# Patient Record
Sex: Female | Born: 1955 | Race: White | Hispanic: No | Marital: Married | Smoking: Former smoker
Health system: Southern US, Community
[De-identification: ages and names within clinical notes are randomized; demographics above are authoritative.]

## PROBLEM LIST (undated history)

## (undated) DIAGNOSIS — I82409 Acute embolism and thrombosis of unspecified deep veins of unspecified lower extremity: Secondary | ICD-10-CM

## (undated) DIAGNOSIS — Z9109 Other allergy status, other than to drugs and biological substances: Secondary | ICD-10-CM

## (undated) DIAGNOSIS — O039 Complete or unspecified spontaneous abortion without complication: Secondary | ICD-10-CM

## (undated) HISTORY — PX: TONSILLECTOMY: SUR1361

## (undated) HISTORY — DX: Complete or unspecified spontaneous abortion without complication: O03.9

## (undated) HISTORY — DX: Acute embolism and thrombosis of unspecified deep veins of unspecified lower extremity: I82.409

## (undated) HISTORY — PX: WISDOM TOOTH EXTRACTION: SHX21

## (undated) HISTORY — PX: DILATION AND CURETTAGE OF UTERUS: SHX78

## (undated) HISTORY — DX: Other allergy status, other than to drugs and biological substances: Z91.09

---

## 1998-09-22 ENCOUNTER — Emergency Department (HOSPITAL_COMMUNITY): Admission: EM | Admit: 1998-09-22 | Discharge: 1998-09-22 | Payer: Self-pay | Admitting: Emergency Medicine

## 1998-09-23 ENCOUNTER — Encounter: Payer: Self-pay | Admitting: Emergency Medicine

## 1998-09-25 ENCOUNTER — Other Ambulatory Visit: Admission: RE | Admit: 1998-09-25 | Discharge: 1998-09-25 | Payer: Self-pay | Admitting: Obstetrics and Gynecology

## 1999-09-28 ENCOUNTER — Other Ambulatory Visit: Admission: RE | Admit: 1999-09-28 | Discharge: 1999-09-28 | Payer: Self-pay | Admitting: *Deleted

## 2000-10-03 ENCOUNTER — Other Ambulatory Visit: Admission: RE | Admit: 2000-10-03 | Discharge: 2000-10-03 | Payer: Self-pay | Admitting: *Deleted

## 2002-03-17 ENCOUNTER — Ambulatory Visit (HOSPITAL_COMMUNITY): Admission: RE | Admit: 2002-03-17 | Discharge: 2002-03-17 | Payer: Self-pay | Admitting: Obstetrics & Gynecology

## 2002-04-13 ENCOUNTER — Other Ambulatory Visit: Admission: RE | Admit: 2002-04-13 | Discharge: 2002-04-13 | Payer: Self-pay | Admitting: Obstetrics & Gynecology

## 2003-01-16 ENCOUNTER — Ambulatory Visit (HOSPITAL_COMMUNITY): Admission: RE | Admit: 2003-01-16 | Discharge: 2003-01-16 | Payer: Self-pay | Admitting: Obstetrics & Gynecology

## 2006-04-03 ENCOUNTER — Encounter: Admission: RE | Admit: 2006-04-03 | Discharge: 2006-04-03 | Payer: Self-pay | Admitting: Orthopedic Surgery

## 2015-08-07 ENCOUNTER — Encounter: Payer: Self-pay | Admitting: Physician Assistant

## 2015-08-07 ENCOUNTER — Ambulatory Visit (INDEPENDENT_AMBULATORY_CARE_PROVIDER_SITE_OTHER): Payer: Commercial Managed Care - HMO | Admitting: Physician Assistant

## 2015-08-07 VITALS — BP 116/76 | HR 80 | Temp 99.1°F | Resp 18 | Wt 224.0 lb

## 2015-08-07 DIAGNOSIS — L500 Allergic urticaria: Secondary | ICD-10-CM | POA: Diagnosis not present

## 2015-08-07 DIAGNOSIS — Z23 Encounter for immunization: Secondary | ICD-10-CM

## 2015-08-07 MED ORDER — PREDNISONE 20 MG PO TABS: ORAL_TABLET | ORAL | Status: DC

## 2015-08-07 MED ORDER — EPINEPHRINE 0.3 MG/0.3ML IJ SOAJ: 0.3000 mg | Freq: Once | INTRAMUSCULAR | Status: DC

## 2015-08-07 NOTE — Progress Notes (Signed)
Patient ID: Courtney Berg MRN: [id number], DOB: 02-06-1956, 59 y.o. Date of Encounter: 08/07/2015, 4:13 PM    Chief Complaint:  Chief Complaint  Patient presents with  . c/o recurrent hives at night    ? allergic reaction???     HPI: 59 y.o. year old white female says that the first time she had an episode of this was at the first of this year. Says that with that episode she developed hives on her fingers, axilla, knees, feet. She took 2 Benadryl and urticaria resolved. Says the next time it happened she decided she better start keeping track of this. Says the second episode occurred on a Thursday, June 28 @ 3 AM. Says that 3 AM she was woken with the exact same findings as above. Uricticaria/hives on her fingers, axilla, knees, feet. Again took 2 Benadryl and it resolved.  The next episode occurred on Wednesday 08/06/15 at 3 AM--- that time the urticaria/hives was mostly on her right waist and right hip her right knee and her feet.  She says that in the past she has known that weed killer would cause her to itch so whenever her husband is having to spray weed killer he knows to stay away from her and she stays away from that.  She says that she is around horses all the time. Never has any problems except for the specific episodes above.  She says that she can see no pattern as to what she has been exposed to at the times of these episodes that she is not exposed to it other times. Has had no change in detergent soaps lotions etc.  Has never  had any other allergic reaction symptoms other than the rash. Has never had difficulty breathing or sensation of throat closing up or tightening or feeling itchy.     Home Meds:   No outpatient prescriptions prior to visit.   No facility-administered medications prior to visit.    Allergies: Not on File    Review of Systems: See HPI for pertinent ROS. All other ROS negative.    Physical Exam: Blood pressure 116/76, pulse 80,  temperature 99.1 F (37.3 C), temperature source Oral, resp. rate 18, weight 224 lb (101.606 kg)., There is no height on file to calculate BMI. General:  WNWD WF. Appears in no acute distress. Neck: Supple. No thyromegaly. No lymphadenopathy. Lungs: Clear bilaterally to auscultation without wheezes, rales, or rhonchi. Breathing is unlabored. Heart: Regular rhythm. No murmurs, rubs, or gallops. Msk:  Strength and tone normal for age. Skin: No rash present currently. No urticaria/hives. Neuro: Alert and oriented X 3. Moves all extremities spontaneously. Gait is normal. CNII-XII grossly in tact. Psych:  Responds to questions appropriately with a normal affect.     ASSESSMENT AND PLAN:  59 y.o. year old female with  1. Allergic urticaria  Wrote down the following for her to take if she has another episode prior to seeing the allergist: She is to take oral Benadryl She is to take Pepcid  If the urticaria does not resolve with this then can take the prednisone.  If she develops any difficulty breathing or sensation of throat closing up tightening up or itching then she is to use the epinephrine.  Referral placed for follow-up with allergist for allergy testing.  - predniSONE (DELTASONE) 20 MG tablet; Take 3 daily for 2 days, then 2 daily for 2 days, then 1 daily for 2 days.  Dispense: 12 tablet; Refill: 0 - EPINEPHrine  0.3 mg/0.3 mL IJ SOAJ injection; Inject 0.3 mLs (0.3 mg total) into the muscle once.  Dispense: 2 Device; Refill: 1 - Ambulatory referral to Allergy  2. Need for prophylactic vaccination and inoculation against influenza - Flu Vaccine QUAD 36+ mos PF IM (Fluarix & Fluzone Quad PF)   Signed, [address] [place], [place], [facility] 08/07/2015 4:13 PM

## 2015-08-19 ENCOUNTER — Other Ambulatory Visit: Payer: Commercial Managed Care - HMO

## 2015-08-19 DIAGNOSIS — Z0000 Encounter for general adult medical examination without abnormal findings: Secondary | ICD-10-CM

## 2015-08-19 LAB — COMPLETE METABOLIC PANEL WITH GFR
ALT: 19 U/L (ref 6–29)
AST: 18 U/L (ref 10–35)
Albumin: 4.2 g/dL (ref 3.6–5.1)
Alkaline Phosphatase: 50 U/L (ref 33–130)
BUN: 10 mg/dL (ref 7–25)
CO2: 29 mmol/L (ref 20–31)
Calcium: 9.6 mg/dL (ref 8.6–10.4)
Chloride: 102 mmol/L (ref 98–110)
Creat: 0.72 mg/dL (ref 0.50–1.05)
GFR, Est African American: >89 mL/min (ref >=60)
GFR, Est Non African American: >89 mL/min (ref >=60)
Glucose, Bld: 96 mg/dL (ref 70–99)
Potassium: 4.2 mmol/L (ref 3.5–5.3)
Sodium: 142 mmol/L (ref 135–146)
Total Bilirubin: 0.4 mg/dL (ref 0.2–1.2)
Total Protein: 7.2 g/dL (ref 6.1–8.1)

## 2015-08-19 LAB — CBC WITH DIFFERENTIAL/PLATELET
Basophils Absolute: 0.1 10*3/uL (ref 0.0–0.1)
Basophils Relative: 1 % (ref 0–1)
EOS ABS: 0.2 10*3/uL (ref 0.0–0.7)
EOS PCT: 4 % (ref 0–5)
HCT: 38.9 % (ref 36.0–46.0)
Hemoglobin: 13.1 g/dL (ref 12.0–15.0)
LYMPHS ABS: 2.1 10*3/uL (ref 0.7–4.0)
Lymphocytes Relative: 41 % (ref 12–46)
MCH: 30.7 pg (ref 26.0–34.0)
MCHC: 33.7 g/dL (ref 30.0–36.0)
MCV: 91.1 fL (ref 78.0–100.0)
MONO ABS: 0.5 10*3/uL (ref 0.1–1.0)
MONOS PCT: 10 % (ref 3–12)
MPV: 9.1 fL (ref 8.6–12.4)
Neutro Abs: 2.3 10*3/uL (ref 1.7–7.7)
Neutrophils Relative %: 44 % (ref 43–77)
PLATELETS: 310 10*3/uL (ref 150–400)
RBC: 4.27 MIL/uL (ref 3.87–5.11)
RDW: 14.4 % (ref 11.5–15.5)
WBC: 5.2 10*3/uL (ref 4.0–10.5)

## 2015-08-19 LAB — LIPID PANEL
Cholesterol: 168 mg/dL (ref 125–200)
HDL: 55 mg/dL (ref >=46)
LDL Cholesterol: 93 mg/dL (ref <130)
Total CHOL/HDL Ratio: 3.1 Ratio (ref <=5.0)
Triglycerides: 102 mg/dL (ref <150)
VLDL: 20 mg/dL (ref <30)

## 2015-08-20 LAB — VITAMIN D 25 HYDROXY (VIT D DEFICIENCY, FRACTURES): Vit D, 25-Hydroxy: 26 ng/mL — ABNORMAL LOW (ref 30–100)

## 2015-08-20 LAB — TSH: TSH: 2.893 u[IU]/mL (ref 0.350–4.500)

## 2015-08-21 ENCOUNTER — Encounter: Payer: Self-pay | Admitting: Physician Assistant

## 2015-08-21 ENCOUNTER — Ambulatory Visit (INDEPENDENT_AMBULATORY_CARE_PROVIDER_SITE_OTHER): Payer: Commercial Managed Care - HMO | Admitting: Physician Assistant

## 2015-08-21 VITALS — BP 116/76 | HR 60 | Temp 98.4°F | Resp 18 | Ht 65.5 in | Wt 214.0 lb

## 2015-08-21 DIAGNOSIS — Z0000 Encounter for general adult medical examination without abnormal findings: Secondary | ICD-10-CM

## 2015-08-21 NOTE — Progress Notes (Signed)
Patient ID: Courtney Berg MRN: [id number], DOB: 04-13-1956, 59 y.o. Date of Encounter: 08/21/2015,   Chief Complaint: Physical (CPE)  HPI: 59 y.o. y/o white female  here for CPE.   She had a recent office visit with me regarding the urticaria. At the time of that visit she was a new patient to our office. However she does report that several family members do come to this office.  She states that she does see Gynecologist, Dr. Edwyna Ready, at [facility].  She does see the dentist routinely and also has eye exam annually. Says that she has worn contacts for many years.  She reports that she sees no other specialists and no other medical providers.  She has no complaints or concerns today.  Review of Systems: Consitutional: No fever, chills, fatigue, night sweats, lymphadenopathy. No significant/unexplained weight changes. Eyes: No visual changes, eye redness, or discharge. ENT/Mouth: No ear pain, sore throat, nasal drainage, or sinus pain. Cardiovascular: No chest pressure,heaviness, tightness or squeezing, even with exertion. No increased shortness of breath or dyspnea on exertion.No palpitations, edema, orthopnea, PND. Respiratory: No cough, hemoptysis, SOB, or wheezing. Gastrointestinal: No anorexia, dysphagia, reflux, pain, nausea, vomiting, hematemesis, diarrhea, constipation, BRBPR, or melena. Breast: No mass, nodules, bulging, or retraction. No skin changes or inflammation. No nipple discharge. No lymphadenopathy. Genitourinary: No dysuria, hematuria, incontinence, vaginal discharge, pruritis, burning, abnormal bleeding, or pain. Musculoskeletal: No decreased ROM, No joint pain or swelling. No significant pain in neck, back, or extremities. Skin: No rash, pruritis, or concerning lesions. Neurological: No headache, dizziness, syncope, seizures, tremors, memory loss, coordination problems, or paresthesias. Psychological: No anxiety, depression, hallucinations,  SI/HI. Endocrine: No polydipsia, polyphagia, polyuria, or known diabetes.No increased fatigue. No palpitations/rapid heart rate. No significant/unexplained weight change. All other systems were reviewed and are otherwise negative.  History reviewed. No pertinent past medical history.   History reviewed. No pertinent past surgical history.  Home Meds:  Outpatient Prescriptions Prior to Visit  Medication Sig Dispense Refill  . diphenhydrAMINE (BENADRYL) 25 MG tablet Take 25 mg by mouth as needed.    Marland Kitchen EPINEPHrine 0.3 mg/0.3 mL IJ SOAJ injection Inject 0.3 mLs (0.3 mg total) into the muscle once. 2 Device 1  . predniSONE (DELTASONE) 20 MG tablet Take 3 daily for 2 days, then 2 daily for 2 days, then 1 daily for 2 days. (Patient not taking: Reported on 08/21/2015) 12 tablet 0   No facility-administered medications prior to visit.    Allergies: No Known Allergies  Social History   Social History  . Marital Status: Married    Spouse Name: N/A  . Number of Children: N/A  . Years of Education: N/A   Occupational History  . Not on file.   Social History Main Topics  . Smoking status: Never Smoker   . Smokeless tobacco: Never Used  . Alcohol Use: No  . Drug Use: No  . Sexual Activity: Not on file   Other Topics Concern  . Not on file   Social History Narrative   Entered 07/2015:   Married.    Does a lot with horses.    Has one son--@ college    Family History  Problem Relation Age of Onset  . Diabetes Mother   . Hypertension Father   . Cancer Father     stomach cancer @ 37. colon cancer @ 60  . Diabetes Brother   . Cancer Sister     leukemia    Physical Exam: Blood pressure 116/76,  pulse 60, temperature 98.4 F (36.9 C), temperature source Oral, resp. rate 18, height 5' 5.5" (1.664 m), weight 214 lb (97.07 kg)., Body mass index is 35.06 kg/(m^2). General: Well developed, well nourished, WF. Appears in no acute distress. HEENT: Normocephalic, atraumatic. Conjunctiva  pink, sclera non-icteric. Pupils 2 mm constricting to 1 mm, round, regular, and equally reactive to light and accomodation. EOMI. Internal auditory canal clear. TMs with good cone of light and without pathology. Nasal mucosa pink. Nares are without discharge. No sinus tenderness. Oral mucosa pink.  Pharynx without exudate.   Neck: Supple. Trachea midline. No thyromegaly. Full ROM. No lymphadenopathy.No Carotid Bruits. Lungs: Clear to auscultation bilaterally without wheezes, rales, or rhonchi. Breathing is of normal effort and unlabored. Cardiovascular: RRR with S1 S2. No murmurs, rubs, or gallops. Distal pulses 2+ symmetrically. No carotid or abdominal bruits. Breast: Per Gyn Abdomen: Soft, non-tender, non-distended with normoactive bowel sounds. No hepatosplenomegaly or masses. No rebound/guarding. No CVA tenderness. No hernias.  Genitourinary: Per Gyn Musculoskeletal: Full range of motion and 5/5 strength throughout.  Skin: Warm and moist without erythema, ecchymosis, wounds, or rash. Neuro: A+Ox3. CN II-XII grossly intact. Moves all extremities spontaneously. Full sensation throughout. Normal gait. DTR 2+ throughout upper and lower extremities.  Psych:  Responds to questions appropriately with a normal affect.   Assessment/Plan:  59 y.o. y/o female here for CPE  1. Visit for preventive health examination  A. Screening Labs: She had fasting labs 08/19/15. CBC normal CMP T normal TSH normal FLP normal--this looks very good. Triglyceride 102, HDL 55, LDL 93 Vitamin D slightly low at 26  B. Pap: Per GYN  C. Screening Mammogram: Per GYN  D. DEXA/BMD:  Per GYN  E. Colorectal Cancer Screening: She reports that she has never had screening colonoscopy. She is agreeable to do so. - Ambulatory referral to Gastroenterology  F. Immunizations:  Influenza:------- given here 08/07/15 Tetanus:-------- she received this 07/25/2012 Pneumococcal:--- She has no indication to require a pneumonia  vaccine until age 60 Zostavax:---------- not indicated until age 63  Continue routine check ups with dentist and continue annual eye exams.  [address] [place], [place], [facility] 08/21/2015 1:43 PM

## 2015-08-22 ENCOUNTER — Encounter: Payer: Self-pay | Admitting: Gastroenterology

## 2015-10-07 ENCOUNTER — Ambulatory Visit (AMBULATORY_SURGERY_CENTER): Payer: Self-pay

## 2015-10-07 VITALS — Ht 67.0 in | Wt 218.6 lb

## 2015-10-07 DIAGNOSIS — Z800 Family history of malignant neoplasm of digestive organs: Secondary | ICD-10-CM

## 2015-10-07 MED ORDER — SUPREP BOWEL PREP KIT 17.5-3.13-1.6 GM/177ML PO SOLN
1.0000 | Freq: Once | ORAL | Status: DC
Start: 2015-10-07 — End: 2015-10-21

## 2015-10-07 NOTE — Progress Notes (Signed)
No allergies to eggs or soy No past problems with anesthesia No home oxygen No diet/weight loss meds  Has email and internet; refused emmi since husband has had one before

## 2015-10-09 ENCOUNTER — Encounter: Payer: Self-pay | Admitting: Gastroenterology

## 2015-10-21 ENCOUNTER — Encounter: Payer: Self-pay | Admitting: Gastroenterology

## 2015-10-21 ENCOUNTER — Ambulatory Visit (AMBULATORY_SURGERY_CENTER): Payer: Commercial Managed Care - HMO | Admitting: Gastroenterology

## 2015-10-21 VITALS — BP 118/69 | HR 58 | Temp 97.2°F | Resp 18 | Ht 67.0 in | Wt 218.0 lb

## 2015-10-21 DIAGNOSIS — Z1211 Encounter for screening for malignant neoplasm of colon: Secondary | ICD-10-CM

## 2015-10-21 DIAGNOSIS — Z800 Family history of malignant neoplasm of digestive organs: Secondary | ICD-10-CM | POA: Diagnosis not present

## 2015-10-21 DIAGNOSIS — D124 Benign neoplasm of descending colon: Secondary | ICD-10-CM

## 2015-10-21 DIAGNOSIS — K635 Polyp of colon: Secondary | ICD-10-CM

## 2015-10-21 MED ORDER — SODIUM CHLORIDE 0.9 % IV SOLN: 500.0000 mL | INTRAVENOUS | Status: DC

## 2015-10-21 NOTE — Patient Instructions (Signed)
YOU HAD AN ENDOSCOPIC PROCEDURE TODAY AT THE [facility]:   Refer to the procedure report that was given to you for any specific questions about what was found during the examination.  If the procedure report does not answer your questions, please call your gastroenterologist to clarify.  If you requested that your care partner not be given the details of your procedure findings, then the procedure report has been included in a sealed envelope for you to review at your convenience later.  YOU SHOULD EXPECT: Some feelings of bloating in the abdomen. Passage of more gas than usual.  Walking can help get rid of the air that was put into your GI tract during the procedure and reduce the bloating. If you had a lower endoscopy (such as a colonoscopy or flexible sigmoidoscopy) you may notice spotting of blood in your stool or on the toilet paper. If you underwent a bowel prep for your procedure, you may not have a normal bowel movement for a few days.  Please Note:  You might notice some irritation and congestion in your nose or some drainage.  This is from the oxygen used during your procedure.  There is no need for concern and it should clear up in a day or so.  SYMPTOMS TO REPORT IMMEDIATELY:   Following lower endoscopy (colonoscopy or flexible sigmoidoscopy):  Excessive amounts of blood in the stool  Significant tenderness or worsening of abdominal pains  Swelling of the abdomen that is new, acute  Fever of 100F or higher   For urgent or emergent issues, a gastroenterologist can be reached at any hour by calling [phone].   DIET: Your first meal following the procedure should be a small meal and then it is ok to progress to your normal diet. Heavy or fried foods are harder to digest and may make you feel nauseous or bloated.  Likewise, meals heavy in dairy and vegetables can increase bloating.  Drink plenty of fluids but you should avoid alcoholic beverages for 24 hours. Try to  increase the fiber in your diet.  ACTIVITY:  You should plan to take it easy for the rest of today and you should NOT DRIVE or use heavy machinery until tomorrow (because of the sedation medicines used during the test).    FOLLOW UP: Our staff will call the number listed on your records the next business day following your procedure to check on you and address any questions or concerns that you may have regarding the information given to you following your procedure. If we do not reach you, we will leave a message.  However, if you are feeling well and you are not experiencing any problems, there is no need to return our call.  We will assume that you have returned to your regular daily activities without incident.  If any biopsies were taken you will be contacted by phone or by letter within the next 1-3 weeks.  Please call us at [phone] if you have not heard about the biopsies in 3 weeks.    SIGNATURES/CONFIDENTIALITY: You and/or your care partner have signed paperwork which will be entered into your electronic medical record.  These signatures attest to the fact that that the information above on your After Visit Summary has been reviewed and is understood.  Full responsibility of the confidentiality of this discharge information lies with you and/or your care-partner.  Read all of the handouts given to you by your recovery room nurse. 

## 2015-10-21 NOTE — Progress Notes (Signed)
Pt stable to RR 

## 2015-10-21 NOTE — Progress Notes (Signed)
Called to room to assist during endoscopic procedure.  Patient ID and intended procedure confirmed with present staff. Received instructions for my participation in the procedure from the performing physician.  

## 2015-10-21 NOTE — Op Note (Signed)
[place]  [address] [place], [address]   COLONOSCOPY PROCEDURE REPORT  PATIENT: Courtney Berg, Courtney Berg  MR#: [id number] BIRTHDATE: April 29, 1956 , 41  yrs. old GENDER: female ENDOSCOPIST: Harl Bowie, MD REFERRED [id number] Dennard Schaumann, M.D. PROCEDURE DATE:  10/21/2015 PROCEDURE:   Colonoscopy with cold biopsy polypectomy First Screening Colonoscopy - Avg.  risk and is 50 yrs.  old or older Yes.  Prior Negative Screening - Now for repeat screening. N/A  History of Adenoma - Now for follow-up colonoscopy & has been > or = to 3 yrs.  N/A  Polyps removed today? Yes ASA CLASS:   Class II INDICATIONS:Screening for colonic neoplasia and FH Colon or Rectal Adenocarcinoma. MEDICATIONS: Propofol 400 mg IV and Lidocaine 40 mg IV  DESCRIPTION OF PROCEDURE:   After the risks benefits and alternatives of the procedure were thoroughly explained, informed consent was obtained.  The digital rectal exam revealed no abnormalities of the rectum.   The LB [id number]  endoscope was introduced through the anus and advanced to the cecum, which was identified by both the appendix and ileocecal valve. No adverse events experienced.   The quality of the prep was good.  The instrument was then slowly withdrawn as the colon was fully examined. Estimated blood loss is zero unless otherwise noted in this procedure report.      COLON FINDINGS: There was mild diverticulosis noted in the ascending colon.   A sessile polyp ranging between 3-69mm in size was found in the descending colon.  Multiple biopsies were performed using cold forceps.  Retroflexed views revealed no abnormalities. The time to cecum = 3.0 Withdrawal time = 11.8   The scope was withdrawn and the procedure completed. COMPLICATIONS: There were no immediate complications.  ENDOSCOPIC IMPRESSION: 1.   There was mild diverticulosis noted in the ascending colon 2.   Sessile polyp ranging between 3-26mm in size was found  in the descending colon; multiple biopsies were performed using cold forceps  RECOMMENDATIONS: 1.  If the polyp(s) removed today are proven to be adenomatous (pre-cancerous) polyps, and given your significant family history of colon cancer, you will need a repeat colonoscopy in 5 years. You will receive a letter within 1-2 weeks with the results of your biopsy as well as final recommendations.  Please call my office if you have not received a letter after 3 weeks.   eSigned:  Harl Bowie, MD 10/21/2015 9:05 AM

## 2015-10-21 NOTE — Progress Notes (Signed)
Patient very vague in answering questions.  States that she "wqill be all right, that she's "just hungry".  Pain #2 at this time.

## 2015-10-22 ENCOUNTER — Telehealth: Payer: Self-pay

## 2015-10-22 NOTE — Telephone Encounter (Signed)
  Follow up Call-  Call back number 10/21/2015  Post procedure Call Back phone  # [phone]  Permission to leave phone message Yes     Patient questions:  Do you have a fever, pain , or abdominal swelling? No. Pain Score  0 *  Have you tolerated food without any problems? Yes.    Have you been able to return to your normal activities? Yes.    Do you have any questions about your discharge instructions: Diet   No. Medications  No. Follow up visit  No.  Do you have questions or concerns about your Care? No.  Actions: * If pain score is 4 or above: No action needed, pain <4.  No problems noted per the pt. maw

## 2015-11-04 ENCOUNTER — Encounter: Payer: Self-pay | Admitting: Gastroenterology

## 2015-12-17 ENCOUNTER — Ambulatory Visit (INDEPENDENT_AMBULATORY_CARE_PROVIDER_SITE_OTHER): Payer: Commercial Managed Care - HMO | Admitting: Internal Medicine

## 2015-12-17 ENCOUNTER — Encounter: Payer: Self-pay | Admitting: Internal Medicine

## 2015-12-17 ENCOUNTER — Other Ambulatory Visit (INDEPENDENT_AMBULATORY_CARE_PROVIDER_SITE_OTHER): Payer: Commercial Managed Care - HMO

## 2015-12-17 VITALS — BP 128/70 | HR 57 | Ht 67.0 in | Wt 218.6 lb

## 2015-12-17 DIAGNOSIS — L500 Allergic urticaria: Secondary | ICD-10-CM

## 2015-12-17 LAB — CBC WITH DIFFERENTIAL/PLATELET
BASOS PCT: 1.2 % (ref 0.0–3.0)
Basophils Absolute: 0.1 10*3/uL (ref 0.0–0.1)
EOS ABS: 0.2 10*3/uL (ref 0.0–0.7)
Eosinophils Relative: 3 % (ref 0.0–5.0)
HCT: 41 % (ref 36.0–46.0)
HEMOGLOBIN: 13.7 g/dL (ref 12.0–15.0)
Lymphocytes Relative: 36.5 % (ref 12.0–46.0)
Lymphs Abs: 2.2 10*3/uL (ref 0.7–4.0)
MCHC: 33.4 g/dL (ref 30.0–36.0)
MCV: 90.3 fl (ref 78.0–100.0)
MONO ABS: 0.7 10*3/uL (ref 0.1–1.0)
Monocytes Relative: 11 % (ref 3.0–12.0)
NEUTROS ABS: 2.9 10*3/uL (ref 1.4–7.7)
Neutrophils Relative %: 48.3 % (ref 43.0–77.0)
PLATELETS: 348 10*3/uL (ref 150.0–400.0)
RBC: 4.54 Mil/uL (ref 3.87–5.11)
RDW: 14 % (ref 11.5–15.5)
WBC: 6 10*3/uL (ref 4.0–10.5)

## 2015-12-17 LAB — HEPATIC FUNCTION PANEL
ALBUMIN: 4.5 g/dL (ref 3.5–5.2)
ALT: 25 U/L (ref 0–35)
AST: 21 U/L (ref 0–37)
Alkaline Phosphatase: 50 U/L (ref 39–117)
Bilirubin, Direct: 0.1 mg/dL (ref 0.0–0.3)
TOTAL PROTEIN: 7.9 g/dL (ref 6.0–8.3)
Total Bilirubin: 0.4 mg/dL (ref 0.2–1.2)

## 2015-12-17 LAB — SEDIMENTATION RATE: Sed Rate: 18 mm/hr (ref 0–22)

## 2015-12-17 NOTE — Patient Instructions (Signed)
Order- lab- Food IgE profile, alpha-gal IGE, Allergy profile,  CBC w diff, ANA, sed rate, hepatic profile   Dx Urticaria  For now- at onset take benadryl 25- 50 mg (H1 antihistamine)   and  Pepcid 10-20 mg (H2 antihistamine) may repeat every 6 hours if needed.

## 2015-12-17 NOTE — Progress Notes (Signed)
12/17/2015-60 year old female former smoker  Referred by Dena Billet, PA-C. consistant breaking out of hives.  starts on palm of hands and moves up her arms each time they break out.   Describes 3 discrete episodes of urticaria without angioedema. Each began during the night and resolved within a few hours after Benadryl. She has now been instructed to take Benadryl plus Pepcid at the outset of another attack. She has an EpiPen and understands how to use it but has had no wheezing, joint pains, syncope or swelling in the mouth or throat. Many years ago she had a similar episode after an insect sting on her forehead. She has noted itching if exposed to a weed killer which she avoids. She spends a lot of time with horses but with no recognized problems. No recognized intolerance to foods. Not on ACEI/ARB or other obvious medication triggers. Postmenopausal. Mother had hives with Naprosyn but otherwise no family history of similar problem. The last episode was several months ago.  Prior to Admission medications   Medication Sig Start Date End Date Taking? Authorizing Provider  diphenhydrAMINE (BENADRYL) 25 MG tablet Take 25 mg by mouth as needed.   Yes Historical Provider, MD  EPINEPHrine 0.3 mg/0.3 mL IJ SOAJ injection Inject 0.3 mLs (0.3 mg total) into the muscle once. 08/07/15  Yes Mary B Dixon, PA-C  famotidine (PEPCID) 10 MG tablet Take 10 mg by mouth as needed for heartburn or indigestion.   Yes Historical Provider, MD  predniSONE (DELTASONE) 20 MG tablet Take 3 daily for 2 days, then 2 daily for 2 days, then 1 daily for 2 days. Patient not taking: Reported on 10/07/2015 08/07/15   Orlena Sheldon, PA-C   Past Medical History  Diagnosis Date  . Environmental allergies     pt still getting workup [id number]  . Miscarriage    Past Surgical History  Procedure Laterality Date  . Wisdom tooth extraction    . Tonsillectomy     Family History  Problem Relation Age of Onset  . Diabetes Mother   .  Hypertension Father   . Cancer Father     stomach cancer @ 8. colon cancer @ 36  . Colon cancer Father   . Diabetes Brother   . Cancer Sister     leukemia   Social History   Social History  . Marital Status: Married    Spouse Name: N/A  . Number of Children: N/A  . Years of Education: N/A   Occupational History  . Not on file.   Social History Main Topics  . Smoking status: Never Smoker   . Smokeless tobacco: Never Used  . Alcohol Use: No  . Drug Use: No  . Sexual Activity: Not on file   Other Topics Concern  . Not on file   Social History Narrative   Entered 07/2015:   Married.    Does a lot with horses.    Has one son--@ college   ROS-see HPI   Negative unless "+" Constitutional:    weight loss, night sweats, fevers, chills, fatigue, lassitude. HEENT:    headaches, difficulty swallowing, tooth/dental problems, sore throat,       sneezing, itching, ear ache, nasal congestion, post nasal drip, snoring CV:    chest pain, orthopnea, PND, swelling in lower extremities, anasarca,  dizziness, palpitations Resp:   shortness of breath with exertion or at rest.                productive cough,   non-productive cough, coughing up of blood.              change in color of mucus.  wheezing.   Skin:    rash or lesions. GI:  No-   heartburn, indigestion, abdominal pain, nausea, vomiting, diarrhea,                 change in bowel habits, loss of appetite GU: dysuria, change in color of urine, no urgency or frequency.   flank pain. MS:   joint pain, stiffness, decreased range of motion, back pain. Neuro-     nothing unusual Psych:  change in mood or affect.  depression or anxiety.   memory loss.  OBJ- Physical Exam General- Alert, Oriented, Affect-appropriate, Distress- none acute Skin- rash-none, lesions- none, excoriation- none Lymphadenopathy- none Head- atraumatic            Eyes- Gross vision intact, PERRLA,  conjunctivae and secretions clear            Ears- Hearing, canals-normal            Nose- Clear, no-Septal dev, mucus, polyps, erosion, perforation             Throat- Mallampati II , mucosa clear , drainage- none, tonsils- atrophic Neck- flexible , trachea midline, no stridor , thyroid nl, carotid no bruit Chest - symmetrical excursion , unlabored           Heart/CV- RRR , no murmur , no gallop  , no rub, nl s1 s2                           - JVD- none , edema- none, stasis changes- none, varices- none           Lung- clear to P&A, wheeze- none, cough- none , dullness-none, rub- none           Chest wall-  Abd-  Br/ Gen/ Rectal- Not done, not indicated Extrem- cyanosis- none, clubbing, none, atrophy- none, strength- nl Neuro- grossly intact to observation

## 2015-12-17 NOTE — Assessment & Plan Note (Signed)
We discussed broad categories of recognized triggers/mechanisms. There is nothing obvious. Since it has now been several months, hopefully the problem is resolved. We did review the use of H1 and H2 antihistamines with potential for other interventions if needed. She has not had angioedema but does have an EpiPen available. She commented that she had eaten "red meat" around the time of each episode of urticaria, but also many other times as well. Plan- search labs for common clues

## 2015-12-18 LAB — ANA: Anti Nuclear Antibody(ANA): NEGATIVE

## 2015-12-19 LAB — FOOD ALLERGY PROFILE
Cashew IgE: <0.1 kU/L
EGG WHITE IGE: 0.14 kU/L — AB
Hazelnut: <0.1 kU/L
Milk IgE: 0.24 kU/L — ABNORMAL HIGH
Scallop IgE: <0.1 kU/L
Sesame Seed f10: <0.1 kU/L
Wheat IgE: <0.1 kU/L

## 2015-12-19 LAB — RESPIRATORY ALLERGY PROFILE REGION II ~~LOC~~
Allergen, Comm Silver Birch, t9: <0.1 kU/L
Allergen, Cottonwood, t14: <0.1 kU/L
Allergen, D pternoyssinus,d7: <0.1 kU/L
Allergen, Mulberry, t76: <0.1 kU/L
Aspergillus fumigatus, m3: <0.1 kU/L
Bermuda Grass: <0.1 kU/L
CAT DANDER: 0.11 kU/L — AB
Cladosporium Herbarum: <0.1 kU/L
Cockroach: <0.1 kU/L
Common Ragweed: <0.1 kU/L
D. farinae: <0.1 kU/L
Dog Dander: 0.15 kU/L — ABNORMAL HIGH
IGE (IMMUNOGLOBULIN E), SERUM: 116 kU/L — AB (ref <115)
Johnson Grass: <0.1 kU/L
Pecan/Hickory Tree IgE: <0.1 kU/L
Penicillium Notatum: <0.1 kU/L
Timothy Grass: <0.1 kU/L

## 2015-12-20 LAB — ALPHA GAL IGE: Alpha Gal IgE*: 2.15 kU/L — ABNORMAL HIGH (ref <0.35)

## 2016-01-30 ENCOUNTER — Telehealth: Payer: Self-pay | Admitting: Internal Medicine

## 2016-01-30 NOTE — Telephone Encounter (Signed)
Result Note     Allergy lab results for evaluation of hives:  There is a high level of allergy body to meat from 4-legged animals ( not fish or poultry). Also elevated against egg white, milk, cat and dog. These results don't prove that allergy triggers symptoms, but it might contribute.Ok to eat the listed foods, but watch more closely for possible connection. Try to reduce exposure to cat and dog by at least keeping them out of bathroom and washing hands after petting. Other labs for blood counts, liver function and lupus were normal.  ---  I spoke with patient about results and she verbalized understanding and had no questions

## 2016-02-02 ENCOUNTER — Telehealth: Payer: Self-pay | Admitting: Internal Medicine

## 2016-02-02 NOTE — Telephone Encounter (Signed)
Spoke with pt, requesting to pick up a copy of her most recent labs.  This has been printed.  Nothing further needed.

## 2016-04-21 ENCOUNTER — Encounter: Payer: Self-pay | Admitting: Internal Medicine

## 2016-04-21 ENCOUNTER — Ambulatory Visit (INDEPENDENT_AMBULATORY_CARE_PROVIDER_SITE_OTHER): Payer: Commercial Managed Care - HMO | Admitting: Internal Medicine

## 2016-04-21 VITALS — BP 122/80 | HR 60 | Ht 67.0 in | Wt 218.2 lb

## 2016-04-21 DIAGNOSIS — L500 Allergic urticaria: Secondary | ICD-10-CM

## 2016-04-21 NOTE — Progress Notes (Signed)
12/17/2015-60 year old female former smoker  Referred by Dena Billet, PA-C. consistant breaking out of hives.  starts on palm of hands and moves up her arms each time they break out.   Describes 3 discrete episodes of urticaria without angioedema. Each began during the night and resolved within a few hours after Benadryl. She has now been instructed to take Benadryl plus Pepcid at the outset of another attack. She has an EpiPen and understands how to use it but has had no wheezing, joint pains, syncope or swelling in the mouth or throat. Many years ago she had a similar episode after an insect sting on her forehead. She has noted itching if exposed to a weed killer which she avoids. She spends a lot of time with horses but with no recognized problems. No recognized intolerance to foods. Not on ACEI/ARB or other obvious medication triggers. Postmenopausal. Mother had hives with Naprosyn but otherwise no family history of similar problem. The last episode was several months ago.  04/21/2016-60 year old female former smoker followed for chronic urticaria FOLLOWS FOR: Pt denies any trouble with rashes or allergies since last seen.  Allergy labs: 12/17/2015 Alpha gal high 2.15  allergy IgE-elevated for egg white, milk, cat, dog     ROS-see HPI   Negative unless "+" Constitutional:    weight loss, night sweats, fevers, chills, fatigue, lassitude. HEENT:    headaches, difficulty swallowing, tooth/dental problems, sore throat,       sneezing, itching, ear ache, nasal congestion, post nasal drip, snoring CV:    chest pain, orthopnea, PND, swelling in lower extremities, anasarca,                                                          dizziness, palpitations Resp:   shortness of breath with exertion or at rest.                productive cough,   non-productive cough, coughing up of blood.              change in color of mucus.  wheezing.   Skin:    rash or lesions. GI:  No-   heartburn, indigestion,  abdominal pain, nausea, vomiting, diarrhea,                 change in bowel habits, loss of appetite GU: dysuria, change in color of urine, no urgency or frequency.   flank pain. MS:   joint pain, stiffness, decreased range of motion, back pain. Neuro-     nothing unusual Psych:  change in mood or affect.  depression or anxiety.   memory loss.  OBJ- Physical Exam General- Alert, Oriented, Affect-appropriate, Distress- none acute Skin- rash-none, lesions- none, excoriation- none Lymphadenopathy- none Head- atraumatic            Eyes- Gross vision intact, PERRLA, conjunctivae and secretions clear            Ears- Hearing, canals-normal            Nose- Clear, no-Septal dev, mucus, polyps, erosion, perforation             Throat- Mallampati II , mucosa clear , drainage- none, tonsils- atrophic Neck- flexible , trachea midline, no stridor , thyroid nl, carotid no bruit Chest - symmetrical excursion , unlabored  Heart/CV- RRR , no murmur , no gallop  , no rub, nl s1 s2                           - JVD- none , edema- none, stasis changes- none, varices- none           Lung- clear to P&A, wheeze- none, cough- none , dullness-none, rub- none           Chest wall-  Abd-  Br/ Gen/ Rectal- Not done, not indicated Extrem- cyanosis- none, clubbing, none, atrophy- none, strength- nl Neuro- grossly intact to observation

## 2016-04-21 NOTE — Patient Instructions (Signed)
You have the elevated allergy antibodies in your blood, but as we discussed, if you don't have problems you can eat what you want. Avoid obvious triggers.  Ok to take an antihistamine like benadryl, claritin, allegra or zyrtec along with an H2 antihistamine like Pepcid, if you start having hives again.

## 2016-10-11 ENCOUNTER — Ambulatory Visit (INDEPENDENT_AMBULATORY_CARE_PROVIDER_SITE_OTHER): Payer: Commercial Managed Care - HMO | Admitting: Family Medicine

## 2016-10-11 DIAGNOSIS — Z23 Encounter for immunization: Secondary | ICD-10-CM | POA: Diagnosis not present

## 2017-01-06 DIAGNOSIS — Z1231 Encounter for screening mammogram for malignant neoplasm of breast: Secondary | ICD-10-CM | POA: Diagnosis not present

## 2017-02-01 DIAGNOSIS — H524 Presbyopia: Secondary | ICD-10-CM | POA: Diagnosis not present

## 2017-02-01 DIAGNOSIS — H5213 Myopia, bilateral: Secondary | ICD-10-CM | POA: Diagnosis not present

## 2017-06-08 ENCOUNTER — Encounter: Payer: Self-pay | Admitting: Family Medicine

## 2017-06-08 ENCOUNTER — Ambulatory Visit (INDEPENDENT_AMBULATORY_CARE_PROVIDER_SITE_OTHER): Payer: Commercial Managed Care - HMO | Admitting: Family Medicine

## 2017-06-08 VITALS — BP 128/72 | HR 66 | Temp 98.7°F | Resp 14 | Ht 67.0 in | Wt 213.0 lb

## 2017-06-08 DIAGNOSIS — L219 Seborrheic dermatitis, unspecified: Secondary | ICD-10-CM | POA: Diagnosis not present

## 2017-06-08 DIAGNOSIS — B9689 Other specified bacterial agents as the cause of diseases classified elsewhere: Secondary | ICD-10-CM | POA: Diagnosis not present

## 2017-06-08 DIAGNOSIS — L089 Local infection of the skin and subcutaneous tissue, unspecified: Secondary | ICD-10-CM | POA: Diagnosis not present

## 2017-06-08 MED ORDER — CEPHALEXIN 500 MG PO CAPS: 500.0000 mg | ORAL_CAPSULE | Freq: Two times a day (BID) | ORAL | 0 refills | Status: DC

## 2017-06-08 MED ORDER — KETOCONAZOLE 2 % EX SHAM: 1.0000 "application " | MEDICATED_SHAMPOO | CUTANEOUS | 0 refills | Status: DC

## 2017-06-08 NOTE — Patient Instructions (Signed)
Wash with shampoo three times a week  Take antibiotics as prescribed F/U as needed

## 2017-06-08 NOTE — Progress Notes (Signed)
   Subjective:    Patient ID: Courtney Berg, female    DOB: [birth year], 61 y.o.   MRN: [id number]  Patient presents for Rash (iritation to back of neck in hairline- states that she has been using iodine to clean and using mometasone furoate cream to treat- reports itching and knots to each side of area) Here with rash to her occipital hairline. She felt itching on Friday when her husband looked at it she has a picture she had erythema with splitting of the skin along with a yellow crusty discharge and flaking. She also had 2 lymph nodes pop-up in the posterior neck. She use iodine to clean it with and then she started using the made as an cream she had at home because of severe itching the rash is pretty much resolved now.  She did not change any of her hair products she last had her hair done at the hair salon about a month ago. She has never had any rash or breakouts before no one in the family had any rash or break out    Review Of Systems:  GEN- denies fatigue, fever, weight loss,weakness, recent illness HEENT- denies eye drainage, change in vision, nasal discharge, CVS- denies chest pain, palpitations RESP- denies SOB, cough, wheeze ABD- denies N/V, change in stools, abd pain GU- denies dysuria, hematuria, dribbling, incontinence MSK- denies joint pain, muscle aches, injury Neuro- denies headache, dizziness, syncope, seizure activity       Objective:    BP 128/72   Pulse 66   Temp 98.7 F (37.1 C) (Oral)   Resp 14   Ht 5\' 7"  (1.702 m)   Wt 213 lb (96.6 kg)   SpO2 98%   BMI 33.36 kg/m  GEN- NAD, alert and oriented x3 HEENT- PERRL, EOMI, non injected sclera, pink conjunctiva, MMM, oropharynx clear Neck- Supple, no thyromegaly, 2 small shotty nodes post neck, mild TTP  Skin- occipital region scalp, mild erythem in linear pattern ( same as picture) mild flaking , no open lesions        Assessment & Plan:      Problem List Items Addressed This Visit    None     Visit Diagnoses    Seborrheic dermatitis of scalp    -  Primary   I think she had fungal infection with superinfection with bacteria. Advised to  Clean all brushes or getting new ones. Steroid helped inflammation a lot. wash with Nizoral shampoos this week  And given 5 days of keflex for bacterial infection/LAD   Superficial bacterial infection of skin       Relevant Medications   cephALEXin (KEFLEX) 500 MG capsule   ketoconazole (NIZORAL) 2 % shampoo (Start on 06/09/2017)      Note: This dictation was prepared with Dragon dictation along with smaller phrase technology. Any transcriptional errors that result from this process are unintentional.

## 2017-07-13 DIAGNOSIS — L821 Other seborrheic keratosis: Secondary | ICD-10-CM | POA: Diagnosis not present

## 2017-07-13 DIAGNOSIS — L400 Psoriasis vulgaris: Secondary | ICD-10-CM | POA: Diagnosis not present

## 2017-07-13 DIAGNOSIS — B078 Other viral warts: Secondary | ICD-10-CM | POA: Diagnosis not present

## 2017-10-12 ENCOUNTER — Ambulatory Visit (INDEPENDENT_AMBULATORY_CARE_PROVIDER_SITE_OTHER): Payer: 59 | Admitting: Family Medicine

## 2017-10-12 DIAGNOSIS — Z23 Encounter for immunization: Secondary | ICD-10-CM

## 2018-01-09 DIAGNOSIS — Z1231 Encounter for screening mammogram for malignant neoplasm of breast: Secondary | ICD-10-CM | POA: Diagnosis not present

## 2018-01-17 ENCOUNTER — Ambulatory Visit: Payer: Commercial Managed Care - HMO | Admitting: Family Medicine

## 2018-01-17 ENCOUNTER — Ambulatory Visit
Admission: RE | Admit: 2018-01-17 | Discharge: 2018-01-17 | Disposition: A | Discharge status: Home / Self Care | Payer: 59 | Source: Ambulatory Visit | Attending: Family Medicine | Admitting: Family Medicine

## 2018-01-17 ENCOUNTER — Encounter: Payer: Self-pay | Admitting: Family Medicine

## 2018-01-17 VITALS — BP 110/68 | HR 78 | Temp 98.2°F | Resp 16 | Ht 67.0 in | Wt 217.0 lb

## 2018-01-17 DIAGNOSIS — M79604 Pain in right leg: Secondary | ICD-10-CM

## 2018-01-17 DIAGNOSIS — R0609 Other forms of dyspnea: Secondary | ICD-10-CM | POA: Diagnosis not present

## 2018-01-17 DIAGNOSIS — R079 Chest pain, unspecified: Secondary | ICD-10-CM

## 2018-01-17 DIAGNOSIS — M79605 Pain in left leg: Principal | ICD-10-CM

## 2018-01-17 DIAGNOSIS — M79662 Pain in left lower leg: Secondary | ICD-10-CM | POA: Diagnosis not present

## 2018-01-17 MED ORDER — PANTOPRAZOLE SODIUM 40 MG PO TBEC: 40.0000 mg | DELAYED_RELEASE_TABLET | Freq: Every day | ORAL | 3 refills | Status: DC

## 2018-01-17 NOTE — Progress Notes (Signed)
Subjective:    Patient ID: Courtney Berg, female    DOB: 12-31-1955, 62 y.o.   MRN: [id number]  HPI  Patient presents with several complaints.  #1 she reports aching pain in her anterior lateral shins whenever she walks.  This is been ongoing for several months.  #2 she reports aching pain in the center of her chest that will frequently awaken her from sleep at night.  This also has been going on for 2 or 3 months.  She also reports stomach discomfort and nausea suggesting acid reflux.  However #3 is her biggest concern.  Saturday she was walking up a flight of steps.  She developed pain in her left arm.  She developed shortness of breath.  She became diaphoretic and pale.  She believed that she was going to pass out.  She barely made it to the top of the steps and had to sit down feeling that she was going to become nauseated.  She stated that she became cool clammy diaphoretic and was pale and extremely short of breath.  She denies feeling as though she was having a panic attack. Past Medical History:  Diagnosis Date  . Environmental allergies    pt still getting workup [id number]  . Miscarriage    Past Surgical History:  Procedure Laterality Date  . TONSILLECTOMY    . WISDOM TOOTH EXTRACTION     Current Outpatient Medications on File Prior to Visit  Medication Sig Dispense Refill  . EPINEPHrine 0.3 mg/0.3 mL IJ SOAJ injection Inject 0.3 mLs (0.3 mg total) into the muscle once. 2 Device 1  . famotidine (PEPCID) 10 MG tablet Take 10 mg by mouth as needed for heartburn or indigestion. Reported on 04/21/2016     No current facility-administered medications on file prior to visit.    No Known Allergies Social History   Socioeconomic History  . Marital status: Married    Spouse name: Not on file  . Number of children: Not on file  . Years of education: Not on file  . Highest education level: Not on file  Social Needs  . Financial resource strain: Not on file  . Food insecurity  - worry: Not on file  . Food insecurity - inability: Not on file  . Transportation needs - medical: Not on file  . Transportation needs - non-medical: Not on file  Occupational History  . Not on file  Tobacco Use  . Smoking status: Former Smoker    Packs/day: 0.25    Years: 7.00    Pack years: 1.75    Types: Cigarettes    Start date: 11/22/1978    Last attempt to quit: 04/21/1986    Years since quitting: 31.7  . Smokeless tobacco: Never Used  Substance and Sexual Activity  . Alcohol use: No    Alcohol/week: 0.0 oz  . Drug use: No  . Sexual activity: Not on file  Other Topics Concern  . Not on file  Social History Narrative   Entered 07/2015:   Married.    Does a lot with horses.    Has one son--@ college     Review of Systems  All other systems reviewed and are negative.      Objective:   Physical Exam  Constitutional: She appears well-developed and well-nourished. No distress.  Cardiovascular: Normal rate, regular rhythm, normal heart sounds and intact distal pulses. Exam reveals no gallop and no friction rub.  No murmur heard. Pulses:  Popliteal pulses are 2+ on the right side, and 2+ on the left side.       Dorsalis pedis pulses are 2+ on the right side, and 2+ on the left side.       Posterior tibial pulses are 2+ on the right side, and 2+ on the left side.  Pulmonary/Chest: Effort normal and breath sounds normal. No respiratory distress. She has no wheezes. She has no rales. She exhibits no tenderness.  Abdominal: Soft. Bowel sounds are normal. She exhibits no distension and no mass. There is no tenderness. There is no rebound and no guarding.  Musculoskeletal:       Right lower leg: She exhibits no tenderness, no bony tenderness, no swelling, no edema and no deformity.       Left lower leg: She exhibits no tenderness, no bony tenderness, no swelling, no edema and no deformity.  Skin: She is not diaphoretic.  Vitals reviewed.         Assessment & Plan:   Chest pain in adult - Plan: EKG 12-Lead, pantoprazole (PROTONIX) 40 MG tablet, Ambulatory referral to Cardiology  Leg pain, bilateral - Plan: DG Tibia/Fibula Left, DG Tibia/Fibula Right  Dyspnea on exertion - Plan: Ambulatory referral to Cardiology   I believe the patient has separate problems.  She has normal pulses in her lower extremities.  I do not believe the leg pain she is experiencing is claudication due to peripheral vascular disease.  She is concerned Saturday may have been due to her heart.  Symptoms are concerning for possible atypical angina.  The other possibility would been a vasovagal reaction due to a panic attack.  The pain in her left shoulder appears to be due to bursitis as she has pain with Hawkins maneuver as well as empty can testing.  I believe the chest pain she is experiencing at night is likely acid reflux.  EKG shows normal sinus rhythm with nonspecific ST changes in the inferior leads.  I believe the likelihood of coronary artery disease is low however I do believe cardiology consult for a stress test is warranted.  Meanwhile I will treat the patient for acid reflux with protonix 40 mg a day and obtain x-rays of the lower legs to evaluate the pain that she is experiencing in her shins.

## 2018-01-23 ENCOUNTER — Encounter: Payer: Self-pay | Admitting: Cardiology

## 2018-01-23 ENCOUNTER — Ambulatory Visit: Payer: 59 | Admitting: Cardiology

## 2018-01-23 VITALS — BP 118/72 | HR 58 | Ht 67.0 in | Wt 218.1 lb

## 2018-01-23 DIAGNOSIS — R0609 Other forms of dyspnea: Secondary | ICD-10-CM | POA: Diagnosis not present

## 2018-01-23 DIAGNOSIS — Z13220 Encounter for screening for lipoid disorders: Secondary | ICD-10-CM | POA: Diagnosis not present

## 2018-01-23 DIAGNOSIS — Z1329 Encounter for screening for other suspected endocrine disorder: Secondary | ICD-10-CM | POA: Diagnosis not present

## 2018-01-23 NOTE — Addendum Note (Signed)
Addended by: Mattie Marlin on: 01/23/2018 03:26 PM   Modules accepted: Orders

## 2018-01-23 NOTE — Patient Instructions (Signed)
Medication Instructions:  Your physician recommends that you continue on your current medications as directed. Please refer to the Current Medication list given to you today.  Labwork: Your physician recommends that you have the following labs drawn: BMP, CBC, TSH, d-dimer, and liver/lipid panel  Testing/Procedures: Your physician has requested that you have an echocardiogram. Echocardiography is a painless test that uses sound waves to create images of your heart. It provides your doctor with information about the size and shape of your heart and how well your heart's chambers and valves are working. This procedure takes approximately one hour. There are no restrictions for this procedure.  Your physician has requested that you have en exercise stress myoview. For further information please visit [email or web address]. Please follow instruction sheet, as given.   Follow-Up: Your physician recommends that you schedule a follow-up appointment in: 3 months  Any Other Special Instructions Will Be Listed Below (If Applicable).     If you need a refill on your cardiac medications before your next appointment, please call your pharmacy.   [place], RN, BSN

## 2018-01-23 NOTE — Progress Notes (Signed)
Cardiology Office Note:    Date:  01/23/2018   ID:  Courtney Berg, DOB [birth year], MRN [id number]  PCP:  Susy Frizzle, MD  Cardiologist:  Jenean Lindau, MD   Referring MD: Susy Frizzle, MD    ASSESSMENT:    1. DOE (dyspnea on exertion)   2. Screening for cholesterol level   3. Screening for thyroid disorder    PLAN:    In order of problems listed above:  1. Primary prevention stressed with the patient.  Importance of compliance with diet stressed and she vocalized understanding. 2. Her blood pressure is stable.  We will check other blood work including fasting lipids.  We will also get a d-dimer for her shortness of breath and exertional issues as these are concerning. 3. Given a choice of evaluation with invasive and noninvasive means.  She feels noninvasive means and we will do an exercise stress Cardiolite.  Echocardiogram will be done to assess murmur heard on auscultation.  I reviewed EKG done at primary care physician's office which was unremarkable and delivery revealed left atrial enlargement. 4. Patient will be seen in follow-up appointment in 3 months or earlier if the patient has any concerns 5. She knows to go to the nearest emergency room for any significant concerns.   Medication Adjustments/Labs and Tests Ordered: Current medicines are reviewed at length with the patient today.  Concerns regarding medicines are outlined above.  Orders Placed This Encounter  Procedures  . Basic metabolic panel  . CBC with Differential/Platelet  . TSH  . Hepatic function panel  . D-Dimer, Quantitative  . Lipid panel  . MYOCARDIAL PERFUSION IMAGING  . ECHOCARDIOGRAM COMPLETE   No orders of the defined types were placed in this encounter.    History of Present Illness:    Courtney Berg is a 62 y.o. female who is being seen today for the evaluation of shortness of breath on exertion at the request of Susy Frizzle, MD.  Patient is a pleasant 62 year old  female.  She has no significant past medical history.  She mentions to me that over the past several weeks she is getting short of breath on exertion.  No chest pain orthopnea or PND.  This is concerning to her and has experienced a decrease in effort tolerance and her quality of life is not good because of the symptoms.  She tells me that she climbs several flights of steps the other day and felt dizzy.  At the time of my evaluation, the patient is alert awake oriented and in no distress.  Her husband accompanies her for this visit.  Past Medical History:  Diagnosis Date  . Environmental allergies    pt still getting workup [id number]  . Miscarriage     Past Surgical History:  Procedure Laterality Date  . TONSILLECTOMY    . WISDOM TOOTH EXTRACTION      Current Medications: Current Meds  Medication Sig  . pantoprazole (PROTONIX) 40 MG tablet Take 1 tablet (40 mg total) by mouth daily.     Allergies:   Patient has no known allergies.   Social History   Socioeconomic History  . Marital status: Married    Spouse name: None  . Number of children: None  . Years of education: None  . Highest education level: None  Social Needs  . Financial resource strain: None  . Food insecurity - worry: None  . Food insecurity - inability: None  . Transportation needs - medical:  None  . Transportation needs - non-medical: None  Occupational History  . None  Tobacco Use  . Smoking status: Former Smoker    Packs/day: 0.25    Years: 7.00    Pack years: 1.75    Types: Cigarettes    Start date: 11/22/1978    Last attempt to quit: 04/21/1986    Years since quitting: 31.7  . Smokeless tobacco: Never Used  Substance and Sexual Activity  . Alcohol use: No    Alcohol/week: 0.0 oz  . Drug use: No  . Sexual activity: None  Other Topics Concern  . None  Social History Narrative   Entered 07/2015:   Married.    Does a lot with horses.    Has one son--@ college     Family History: The  patient's family history includes Cancer in her father and sister; Colon cancer in her father; Diabetes in her brother and mother; Hypertension in her father.  ROS:   Please see the history of present illness.    All other systems reviewed and are negative.  EKGs/Labs/Other Studies Reviewed:    The following studies were reviewed today: I reviewed her records.  EKG reveals sinus rhythm with nonspecific ST-T changes.   Recent Labs: No results found for requested labs within last 8760 hours.  Recent Lipid Panel    Component Value Date/Time   CHOL 168 08/19/2015 0944   TRIG 102 08/19/2015 0944   HDL 55 08/19/2015 0944   CHOLHDL 3.1 08/19/2015 0944   VLDL 20 08/19/2015 0944   LDLCALC 93 08/19/2015 0944    Physical Exam:    VS:  BP 118/72 (BP Location: Right Arm, Patient Position: Sitting, Cuff Size: Normal)   Pulse (!) 58   Ht 5\' 7"  (1.702 m)   Wt 218 lb 1.9 oz (98.9 kg)   SpO2 97%   BMI 34.16 kg/m     Wt Readings from Last 3 Encounters:  01/23/18 218 lb 1.9 oz (98.9 kg)  01/17/18 217 lb (98.4 kg)  06/08/17 213 lb (96.6 kg)     GEN: Patient is in no acute distress HEENT: Normal NECK: No JVD; No carotid bruits LYMPHATICS: No lymphadenopathy CARDIAC: S1 S2 regular, 2/6 systolic murmur at the apex. RESPIRATORY:  Clear to auscultation without rales, wheezing or rhonchi  ABDOMEN: Soft, non-tender, non-distended MUSCULOSKELETAL:  No edema; No deformity  SKIN: Warm and dry NEUROLOGIC:  Alert and oriented x 3 PSYCHIATRIC:  Normal affect    Signed, Jenean Lindau, MD  01/23/2018 2:31 PM    [facility]

## 2018-01-24 ENCOUNTER — Ambulatory Visit (HOSPITAL_BASED_OUTPATIENT_CLINIC_OR_DEPARTMENT_OTHER)
Admission: RE | Admit: 2018-01-24 | Discharge: 2018-01-24 | Disposition: A | Discharge status: Home / Self Care | Payer: 59 | Source: Ambulatory Visit | Attending: Cardiology | Admitting: Cardiology

## 2018-01-24 ENCOUNTER — Other Ambulatory Visit: Payer: Self-pay

## 2018-01-24 ENCOUNTER — Telehealth: Payer: Self-pay | Admitting: Cardiology

## 2018-01-24 ENCOUNTER — Telehealth (HOSPITAL_COMMUNITY): Payer: Self-pay | Admitting: *Deleted

## 2018-01-24 DIAGNOSIS — R7989 Other specified abnormal findings of blood chemistry: Secondary | ICD-10-CM

## 2018-01-24 DIAGNOSIS — R0609 Other forms of dyspnea: Secondary | ICD-10-CM | POA: Insufficient documentation

## 2018-01-24 DIAGNOSIS — K760 Fatty (change of) liver, not elsewhere classified: Secondary | ICD-10-CM | POA: Diagnosis not present

## 2018-01-24 DIAGNOSIS — I700 Atherosclerosis of aorta: Secondary | ICD-10-CM | POA: Diagnosis not present

## 2018-01-24 MED ORDER — IOPAMIDOL (ISOVUE-370) INJECTION 76%
100.0000 mL | Freq: Once | INTRAVENOUS | Status: AC | PRN
  Administered 2018-01-24: 74 mL via INTRAVENOUS

## 2018-01-24 NOTE — Telephone Encounter (Signed)
Returning your call. °

## 2018-01-24 NOTE — Telephone Encounter (Signed)
Patient given detailed instructions per Myocardial Perfusion Study Information Sheet for the test on 01/30/18 at 0945. Patient notified to arrive 15 minutes early and that it is imperative to arrive on time for appointment to keep from having the test rescheduled.  If you need to cancel or reschedule your appointment, please call the office within 24 hours of your appointment. . Patient verbalized understanding.Courtney Berg, Ranae [place]

## 2018-01-25 NOTE — Telephone Encounter (Signed)
Spoke with patient yesterday.

## 2018-01-27 LAB — CBC WITH DIFFERENTIAL/PLATELET
Basophils Absolute: 0.1 10*3/uL (ref 0.0–0.2)
Basos: 2 %
EOS (ABSOLUTE): 0.2 10*3/uL (ref 0.0–0.4)
EOS: 2 %
HEMOGLOBIN: 13.7 g/dL (ref 11.1–15.9)
Hematocrit: 42.1 % (ref 34.0–46.6)
IMMATURE GRANS (ABS): 0 10*3/uL (ref 0.0–0.1)
Immature Granulocytes: 0 %
LYMPHS: 38 %
Lymphocytes Absolute: 2.5 10*3/uL (ref 0.7–3.1)
MCH: 30.6 pg (ref 26.6–33.0)
MCHC: 32.5 g/dL (ref 31.5–35.7)
MCV: 94 fL (ref 79–97)
MONOCYTES: 9 %
Monocytes Absolute: 0.6 10*3/uL (ref 0.1–0.9)
NEUTROS ABS: 3.1 10*3/uL (ref 1.4–7.0)
Neutrophils: 49 %
Platelets: 358 10*3/uL (ref 150–379)
RBC: 4.47 x10E6/uL (ref 3.77–5.28)
RDW: 14.3 % (ref 12.3–15.4)
WBC: 6.4 10*3/uL (ref 3.4–10.8)

## 2018-01-27 LAB — D-DIMER, QUANTITATIVE (NOT AT ARMC): D-DIMER: 0.84 mg{FEU}/L — AB (ref 0.00–0.49)

## 2018-01-27 LAB — BASIC METABOLIC PANEL
BUN/Creatinine Ratio: 14 (ref 12–28)
BUN: 11 mg/dL (ref 8–27)
CALCIUM: 9.9 mg/dL (ref 8.7–10.3)
CO2: 26 mmol/L (ref 20–29)
CREATININE: 0.81 mg/dL (ref 0.57–1.00)
Chloride: 99 mmol/L (ref 96–106)
GFR calc Af Amer: 91 mL/min/{1.73_m2} (ref >59)
GFR, EST NON AFRICAN AMERICAN: 79 mL/min/{1.73_m2} (ref >59)
Glucose: 89 mg/dL (ref 65–99)
POTASSIUM: 4.2 mmol/L (ref 3.5–5.2)
Sodium: 138 mmol/L (ref 134–144)

## 2018-01-27 LAB — HEPATIC FUNCTION PANEL
ALT: 27 IU/L (ref 0–32)
AST: 24 IU/L (ref 0–40)
Albumin: 4.6 g/dL (ref 3.6–4.8)
Alkaline Phosphatase: 51 IU/L (ref 39–117)
BILIRUBIN, DIRECT: 0.11 mg/dL (ref 0.00–0.40)
Bilirubin Total: 0.3 mg/dL (ref 0.0–1.2)
TOTAL PROTEIN: 7.9 g/dL (ref 6.0–8.5)

## 2018-01-27 LAB — TSH: TSH: 2.54 u[IU]/mL (ref 0.450–4.500)

## 2018-01-30 ENCOUNTER — Ambulatory Visit (HOSPITAL_BASED_OUTPATIENT_CLINIC_OR_DEPARTMENT_OTHER): Payer: 59

## 2018-01-30 ENCOUNTER — Ambulatory Visit (HOSPITAL_COMMUNITY): Payer: 59 | Attending: Cardiology

## 2018-01-30 VITALS — Ht 67.0 in | Wt 218.0 lb

## 2018-01-30 DIAGNOSIS — R0609 Other forms of dyspnea: Secondary | ICD-10-CM

## 2018-01-30 DIAGNOSIS — I340 Nonrheumatic mitral (valve) insufficiency: Secondary | ICD-10-CM | POA: Diagnosis not present

## 2018-01-30 MED ORDER — TECHNETIUM TC 99M TETROFOSMIN IV KIT
32.4000 | PACK | Freq: Once | INTRAVENOUS | Status: AC | PRN
  Administered 2018-01-30: 32.4 via INTRAVENOUS
  Filled 2018-01-30: qty 33

## 2018-01-31 ENCOUNTER — Other Ambulatory Visit: Payer: 59

## 2018-01-31 ENCOUNTER — Ambulatory Visit (HOSPITAL_COMMUNITY): Payer: 59 | Attending: Cardiovascular Disease

## 2018-01-31 DIAGNOSIS — Z13220 Encounter for screening for lipoid disorders: Secondary | ICD-10-CM | POA: Diagnosis not present

## 2018-01-31 LAB — LIPID PANEL
CHOL/HDL RATIO: 2.4 ratio (ref 0.0–4.4)
Cholesterol, Total: 178 mg/dL (ref 100–199)
HDL: 74 mg/dL (ref >39)
LDL CALC: 84 mg/dL (ref 0–99)
TRIGLYCERIDES: 99 mg/dL (ref 0–149)
VLDL CHOLESTEROL CAL: 20 mg/dL (ref 5–40)

## 2018-01-31 LAB — MYOCARDIAL PERFUSION IMAGING
CHL CUP MPHR: 160 {beats}/min
CHL CUP NUCLEAR SDS: 0
CHL CUP NUCLEAR SRS: 1
CHL CUP NUCLEAR SSS: 1
CHL CUP RESTING HR STRESS: 69 {beats}/min
CSEPED: 4 min
CSEPEW: 6.4 METS
Exercise duration (sec): 30 s
LV dias vol: 92 mL (ref 46–106)
LV sys vol: 29 mL
Peak HR: 141 {beats}/min
Percent HR: 88 %
RATE: 0.35
TID: 0.89

## 2018-01-31 MED ORDER — TECHNETIUM TC 99M TETROFOSMIN IV KIT
32.0000 | PACK | Freq: Once | INTRAVENOUS | Status: AC | PRN
  Administered 2018-01-31: 32 via INTRAVENOUS
  Filled 2018-01-31: qty 32

## 2018-02-02 DIAGNOSIS — H524 Presbyopia: Secondary | ICD-10-CM | POA: Diagnosis not present

## 2018-02-02 DIAGNOSIS — H5213 Myopia, bilateral: Secondary | ICD-10-CM | POA: Diagnosis not present

## 2018-10-18 ENCOUNTER — Ambulatory Visit (INDEPENDENT_AMBULATORY_CARE_PROVIDER_SITE_OTHER): Payer: 59 | Admitting: Family Medicine

## 2018-10-18 DIAGNOSIS — Z23 Encounter for immunization: Secondary | ICD-10-CM

## 2019-01-15 DIAGNOSIS — Z1231 Encounter for screening mammogram for malignant neoplasm of breast: Secondary | ICD-10-CM | POA: Diagnosis not present

## 2019-01-15 LAB — HM MAMMOGRAPHY

## 2019-01-17 ENCOUNTER — Encounter: Payer: Self-pay | Admitting: *Deleted

## 2019-10-17 ENCOUNTER — Ambulatory Visit (INDEPENDENT_AMBULATORY_CARE_PROVIDER_SITE_OTHER): Payer: 59

## 2019-10-17 ENCOUNTER — Other Ambulatory Visit: Payer: Self-pay

## 2019-10-17 DIAGNOSIS — Z23 Encounter for immunization: Secondary | ICD-10-CM | POA: Diagnosis not present

## 2020-02-23 ENCOUNTER — Ambulatory Visit: Payer: Self-pay | Attending: Internal Medicine

## 2020-02-23 DIAGNOSIS — Z23 Encounter for immunization: Secondary | ICD-10-CM

## 2020-02-23 NOTE — Progress Notes (Signed)
   Covid-19 Vaccination Clinic  Name:  Courtney Berg    MRN: [id number] DOB: 12-13-1955  02/23/2020  [name] was observed post Covid-19 immunization for 15 minutes without incident. She was provided with Vaccine Information Sheet and instruction to access the V-Safe system.   [name] was instructed to call 911 with any severe reactions post vaccine: Marland Kitchen Difficulty breathing  . Swelling of face and throat  . A fast heartbeat  . A bad rash all over body  . Dizziness and weakness   Immunizations Administered    Name Date Dose VIS Date Route   Pfizer COVID-19 Vaccine 02/23/2020 10:59 AM 0.3 mL 11/02/2019 Intramuscular   Manufacturer: [employer], [employer]   Lot: [id number]   [place]: [id number]

## 2020-03-15 ENCOUNTER — Ambulatory Visit: Payer: Self-pay

## 2020-03-19 ENCOUNTER — Ambulatory Visit: Payer: Self-pay

## 2020-03-22 ENCOUNTER — Ambulatory Visit: Payer: Self-pay | Attending: Internal Medicine

## 2020-03-22 DIAGNOSIS — Z23 Encounter for immunization: Secondary | ICD-10-CM

## 2020-03-22 NOTE — Progress Notes (Signed)
   Covid-19 Vaccination Clinic  Name:  Courtney Berg    MRN: [id number] DOB: [birth year]  03/22/2020  [name] was observed post Covid-19 immunization for 15 minutes without incident. She was provided with Vaccine Information Sheet and instruction to access the V-Safe system.   [name] was instructed to call 911 with any severe reactions post vaccine: Marland Kitchen Difficulty breathing  . Swelling of face and throat  . A fast heartbeat  . A bad rash all over body  . Dizziness and weakness   Immunizations Administered    Name Date Dose VIS Date Route   Pfizer COVID-19 Vaccine 03/22/2020 10:26 AM 0.3 mL 01/16/2019 Intramuscular   Manufacturer: [place]   Lot: [id number]   [place]: [id number]

## 2020-11-26 ENCOUNTER — Ambulatory Visit: Payer: Self-pay

## 2020-11-26 ENCOUNTER — Ambulatory Visit: Payer: Self-pay | Attending: Internal Medicine

## 2020-11-26 ENCOUNTER — Other Ambulatory Visit (HOSPITAL_COMMUNITY): Payer: Self-pay | Admitting: Internal Medicine

## 2020-11-26 DIAGNOSIS — Z23 Encounter for immunization: Secondary | ICD-10-CM

## 2020-11-26 NOTE — Progress Notes (Signed)
   Covid-19 Vaccination Clinic  Name:  Courtney Berg    MRN: [id number] DOB: 1956/07/16  11/26/2020  Courtney Berg was observed post Covid-19 immunization for 15 minutes without incident. She was provided with Vaccine Information Sheet and instruction to access the V-Safe system.   Courtney Berg was instructed to call 911 with any severe reactions post vaccine: Marland Kitchen Difficulty breathing  . Swelling of face and throat  . A fast heartbeat  . A bad rash all over body  . Dizziness and weakness   Immunizations Administered    Name Date Dose VIS Date Route   Pfizer COVID-19 Vaccine 11/26/2020 12:08 PM 0.3 mL 09/10/2020 Intramuscular   Manufacturer: [employer], [employer]   Lot: [id number]   ND[place]: [address]-6

## 2020-12-16 ENCOUNTER — Other Ambulatory Visit: Payer: Self-pay

## 2020-12-16 ENCOUNTER — Ambulatory Visit: Payer: 59 | Admitting: Family Medicine

## 2020-12-16 ENCOUNTER — Encounter: Payer: Self-pay | Admitting: Family Medicine

## 2020-12-16 VITALS — BP 130/58 | HR 79 | Temp 98.5°F | Ht 67.0 in | Wt 206.0 lb

## 2020-12-16 DIAGNOSIS — Z23 Encounter for immunization: Secondary | ICD-10-CM | POA: Diagnosis not present

## 2020-12-16 DIAGNOSIS — G8929 Other chronic pain: Secondary | ICD-10-CM | POA: Diagnosis not present

## 2020-12-16 DIAGNOSIS — M25561 Pain in right knee: Secondary | ICD-10-CM

## 2020-12-16 DIAGNOSIS — Z13220 Encounter for screening for lipoid disorders: Secondary | ICD-10-CM | POA: Diagnosis not present

## 2020-12-16 DIAGNOSIS — M25562 Pain in left knee: Secondary | ICD-10-CM | POA: Diagnosis not present

## 2020-12-16 LAB — CBC WITH DIFFERENTIAL/PLATELET
Basophils Absolute: 102 cells/uL (ref 0–200)
Eosinophils Absolute: 88 cells/uL (ref 15–500)

## 2020-12-16 NOTE — Progress Notes (Signed)
Subjective:    Patient ID: Courtney Berg, female    DOB: 1956-07-30, 66 y.o.   MRN: [id number]  HPI  Patient has had bilateral knee pain.  Is been going on for more than a year however over the last couple of weeks has been more serious.  I saw her 2 years ago and did bilateral tibia/fibula films that were negative.  However the pain is now primarily centered around the knees.  She describes an aching pain that wakes her up at night.  It hurts to climb or go up steps.  It hurts to walk on level ground.  She reports aching throbbing pain on both joint lines.  She is tried Tylenol with no relief.  She denies any laxity to varus or valgus stress.  She denies any locking or catching in the knee. Past Medical History:  Diagnosis Date  . Environmental allergies    pt still getting workup [id number]  . Miscarriage    Past Surgical History:  Procedure Laterality Date  . TONSILLECTOMY    . WISDOM TOOTH EXTRACTION     No current outpatient medications on file prior to visit.   No current facility-administered medications on file prior to visit.   No Known Allergies Social History   Socioeconomic History  . Marital status: Married    Spouse name: Not on file  . Number of children: Not on file  . Years of education: Not on file  . Highest education level: Not on file  Occupational History  . Not on file  Tobacco Use  . Smoking status: Former Smoker    Packs/day: 0.25    Years: 7.00    Pack years: 1.75    Types: Cigarettes    Start date: 11/22/1978    Quit date: 04/21/1986    Years since quitting: 34.6  . Smokeless tobacco: Never Used  Substance and Sexual Activity  . Alcohol use: No    Alcohol/week: 0.0 standard drinks  . Drug use: No  . Sexual activity: Not on file  Other Topics Concern  . Not on file  Social History Narrative   Entered 07/2015:   Married.    Does a lot with horses.    Has one son--@ college   Social Determinants of Health   Financial Resource  Strain: Not on file  Food Insecurity: Not on file  Transportation Needs: Not on file  Physical Activity: Not on file  Stress: Not on file  Social Connections: Not on file  Intimate Partner Violence: Not on file     Review of Systems  All other systems reviewed and are negative.      Objective:   Physical Exam Vitals reviewed.  Constitutional:      General: She is not in acute distress.    Appearance: She is well-developed. She is not diaphoretic.  Cardiovascular:     Rate and Rhythm: Normal rate and regular rhythm.     Pulses:          Popliteal pulses are 2+ on the right side and 2+ on the left side.       Dorsalis pedis pulses are 2+ on the right side and 2+ on the left side.       Posterior tibial pulses are 2+ on the right side and 2+ on the left side.     Heart sounds: Normal heart sounds. No murmur heard. No friction rub. No gallop.   Pulmonary:     Effort: Pulmonary effort is  normal. No respiratory distress.     Breath sounds: Normal breath sounds. No wheezing or rales.  Chest:     Chest wall: No tenderness.  Abdominal:     General: Bowel sounds are normal. There is no distension.     Palpations: Abdomen is soft. There is no mass.     Tenderness: There is no abdominal tenderness. There is no guarding or rebound.  Musculoskeletal:     Right knee: Decreased range of motion. Tenderness present over the medial joint line and lateral joint line. No LCL laxity, MCL laxity, ACL laxity or PCL laxity.     Left knee: Decreased range of motion. Tenderness present over the medial joint line and lateral joint line. No LCL laxity, MCL laxity, ACL laxity or PCL laxity.    Right lower leg: No swelling, deformity, tenderness or bony tenderness. No edema.     Left lower leg: No swelling, deformity, tenderness or bony tenderness. No edema.           Assessment & Plan:  Need for immunization against influenza - Plan: Flu Vaccine QUAD 36+ mos IM  Screening for cholesterol level  - Plan: CBC with Differential/Platelet, COMPLETE METABOLIC PANEL WITH GFR, Lipid panel  Chronic pain of both knees  Patient would like lab work while she is here today so I will check a CBC, CMP, lipid panel.  I will treat the pain in her knees with cortisone injections.  We discussed a trial of meloxicam versus cortisone injections and she states that the pain is so severe that she would like to try the cortisone injections first.  Using sterile technique, I injected the right knee with 2 cc lidocaine, 2 cc of Marcaine, and 2 cc of 40 mg/mL Kenalog.  Then using sterile technique I repeated the procedure on the left knee.  Patient tolerated both injections well without complications.  Reassess in 1 week.  If the pain is no better, proceed with imaging of the knee and likely an orthopedic consultation.

## 2020-12-17 LAB — COMPLETE METABOLIC PANEL WITH GFR
AG Ratio: 1.6 (calc) (ref 1.0–2.5)
ALT: 14 U/L (ref 6–29)
AST: 17 U/L (ref 10–35)
Albumin: 4.6 g/dL (ref 3.6–5.1)
Alkaline phosphatase (APISO): 48 U/L (ref 37–153)
BUN: 14 mg/dL (ref 7–25)
CO2: 29 mmol/L (ref 20–32)
Calcium: 9.9 mg/dL (ref 8.6–10.4)
Chloride: 101 mmol/L (ref 98–110)
Creat: 0.83 mg/dL (ref 0.50–0.99)
GFR, Est African American: 87 mL/min/{1.73_m2} (ref >=60)
GFR, Est Non African American: 75 mL/min/{1.73_m2} (ref >=60)
Globulin: 2.8 g/dL (calc) (ref 1.9–3.7)
Glucose, Bld: 94 mg/dL (ref 65–99)
Potassium: 4.2 mmol/L (ref 3.5–5.3)
Sodium: 139 mmol/L (ref 135–146)
Total Bilirubin: 0.3 mg/dL (ref 0.2–1.2)
Total Protein: 7.4 g/dL (ref 6.1–8.1)

## 2020-12-17 LAB — CBC WITH DIFFERENTIAL/PLATELET
Absolute Monocytes: 612 cells/uL (ref 200–950)
Basophils Relative: 1.5 %
Eosinophils Relative: 1.3 %
HCT: 39.5 % (ref 35.0–45.0)
Hemoglobin: 13.4 g/dL (ref 11.7–15.5)
Lymphs Abs: 2074 cells/uL (ref 850–3900)
MCH: 30.8 pg (ref 27.0–33.0)
MCHC: 33.9 g/dL (ref 32.0–36.0)
MCV: 90.8 fL (ref 80.0–100.0)
MPV: 10.2 fL (ref 7.5–12.5)
Monocytes Relative: 9 %
Neutro Abs: 3924 cells/uL (ref 1500–7800)
Neutrophils Relative %: 57.7 %
Platelets: 337 10*3/uL (ref 140–400)
RBC: 4.35 10*6/uL (ref 3.80–5.10)
RDW: 12.5 % (ref 11.0–15.0)
Total Lymphocyte: 30.5 %
WBC: 6.8 10*3/uL (ref 3.8–10.8)

## 2020-12-17 LAB — LIPID PANEL
Cholesterol: 204 mg/dL — ABNORMAL HIGH (ref <200)
HDL: 73 mg/dL (ref >=50)
LDL Cholesterol (Calc): 110 mg/dL (calc) — ABNORMAL HIGH
Non-HDL Cholesterol (Calc): 131 mg/dL (calc) — ABNORMAL HIGH (ref <130)
Total CHOL/HDL Ratio: 2.8 (calc) (ref <5.0)
Triglycerides: 105 mg/dL (ref <150)

## 2020-12-18 ENCOUNTER — Encounter: Payer: Self-pay | Admitting: *Deleted

## 2021-01-06 ENCOUNTER — Encounter: Payer: Self-pay | Admitting: Gastroenterology

## 2021-01-26 ENCOUNTER — Encounter: Payer: Self-pay | Admitting: Family Medicine

## 2021-06-26 ENCOUNTER — Emergency Department (HOSPITAL_COMMUNITY): Payer: Worker's Compensation

## 2021-06-26 ENCOUNTER — Inpatient Hospital Stay (HOSPITAL_COMMUNITY)
Admission: EM | Admit: 2021-06-26 | Discharge: 2021-07-13 | DRG: 493 | Disposition: A | Discharge status: Rehabilitation Facility | Payer: Worker's Compensation | Attending: Internal Medicine | Admitting: Internal Medicine

## 2021-06-26 DIAGNOSIS — T148XXA Other injury of unspecified body region, initial encounter: Secondary | ICD-10-CM

## 2021-06-26 DIAGNOSIS — S9304XA Dislocation of right ankle joint, initial encounter: Secondary | ICD-10-CM

## 2021-06-26 DIAGNOSIS — S82401A Unspecified fracture of shaft of right fibula, initial encounter for closed fracture: Secondary | ICD-10-CM

## 2021-06-26 DIAGNOSIS — R52 Pain, unspecified: Secondary | ICD-10-CM

## 2021-06-26 DIAGNOSIS — S82002A Unspecified fracture of left patella, initial encounter for closed fracture: Secondary | ICD-10-CM | POA: Diagnosis not present

## 2021-06-26 DIAGNOSIS — J9690 Respiratory failure, unspecified, unspecified whether with hypoxia or hypercapnia: Secondary | ICD-10-CM

## 2021-06-26 DIAGNOSIS — Z20822 Contact with and (suspected) exposure to covid-19: Secondary | ICD-10-CM | POA: Diagnosis present

## 2021-06-26 DIAGNOSIS — D62 Acute posthemorrhagic anemia: Secondary | ICD-10-CM | POA: Diagnosis present

## 2021-06-26 DIAGNOSIS — Z419 Encounter for procedure for purposes other than remedying health state, unspecified: Secondary | ICD-10-CM

## 2021-06-26 DIAGNOSIS — Y9301 Activity, walking, marching and hiking: Secondary | ICD-10-CM | POA: Diagnosis present

## 2021-06-26 DIAGNOSIS — L03116 Cellulitis of left lower limb: Secondary | ICD-10-CM | POA: Diagnosis not present

## 2021-06-26 DIAGNOSIS — I951 Orthostatic hypotension: Secondary | ICD-10-CM

## 2021-06-26 DIAGNOSIS — S82851A Displaced trimalleolar fracture of right lower leg, initial encounter for closed fracture: Principal | ICD-10-CM | POA: Diagnosis present

## 2021-06-26 DIAGNOSIS — E669 Obesity, unspecified: Secondary | ICD-10-CM | POA: Diagnosis present

## 2021-06-26 DIAGNOSIS — S82201A Unspecified fracture of shaft of right tibia, initial encounter for closed fracture: Secondary | ICD-10-CM | POA: Diagnosis present

## 2021-06-26 DIAGNOSIS — E559 Vitamin D deficiency, unspecified: Secondary | ICD-10-CM | POA: Diagnosis present

## 2021-06-26 DIAGNOSIS — W109XXA Fall (on) (from) unspecified stairs and steps, initial encounter: Secondary | ICD-10-CM | POA: Diagnosis present

## 2021-06-26 DIAGNOSIS — W19XXXA Unspecified fall, initial encounter: Secondary | ICD-10-CM

## 2021-06-26 DIAGNOSIS — S82841A Displaced bimalleolar fracture of right lower leg, initial encounter for closed fracture: Secondary | ICD-10-CM

## 2021-06-26 DIAGNOSIS — T7840XA Allergy, unspecified, initial encounter: Secondary | ICD-10-CM

## 2021-06-26 DIAGNOSIS — E871 Hypo-osmolality and hyponatremia: Secondary | ICD-10-CM | POA: Diagnosis present

## 2021-06-26 DIAGNOSIS — Z8249 Family history of ischemic heart disease and other diseases of the circulatory system: Secondary | ICD-10-CM

## 2021-06-26 DIAGNOSIS — S82042A Displaced comminuted fracture of left patella, initial encounter for closed fracture: Secondary | ICD-10-CM | POA: Diagnosis present

## 2021-06-26 DIAGNOSIS — Z87891 Personal history of nicotine dependence: Secondary | ICD-10-CM

## 2021-06-26 DIAGNOSIS — Z6830 Body mass index (BMI) 30.0-30.9, adult: Secondary | ICD-10-CM

## 2021-06-26 DIAGNOSIS — Z833 Family history of diabetes mellitus: Secondary | ICD-10-CM

## 2021-06-26 LAB — COMPREHENSIVE METABOLIC PANEL
ALT: 11 U/L (ref 0–44)
AST: 22 U/L (ref 15–41)
Albumin: 3.7 g/dL (ref 3.5–5.0)
Alkaline Phosphatase: 42 U/L (ref 38–126)
Anion gap: 9 (ref 5–15)
BUN: 11 mg/dL (ref 8–23)
CO2: 24 mmol/L (ref 22–32)
Calcium: 8.9 mg/dL (ref 8.9–10.3)
Chloride: 106 mmol/L (ref 98–111)
Creatinine, Ser: 0.8 mg/dL (ref 0.44–1.00)
GFR, Estimated: >60 mL/min (ref >60)
Glucose, Bld: 116 mg/dL — ABNORMAL HIGH (ref 70–99)
Potassium: 3.5 mmol/L (ref 3.5–5.1)
Sodium: 139 mmol/L (ref 135–145)
Total Bilirubin: 0.8 mg/dL (ref 0.3–1.2)
Total Protein: 6.4 g/dL — ABNORMAL LOW (ref 6.5–8.1)

## 2021-06-26 LAB — CBC WITH DIFFERENTIAL/PLATELET
Abs Immature Granulocytes: 0.08 10*3/uL — ABNORMAL HIGH (ref 0.00–0.07)
Basophils Absolute: 0.1 10*3/uL (ref 0.0–0.1)
Basophils Relative: 1 %
Eosinophils Absolute: 0 10*3/uL (ref 0.0–0.5)
Eosinophils Relative: 0 %
HCT: 38.7 % (ref 36.0–46.0)
Hemoglobin: 12.5 g/dL (ref 12.0–15.0)
Immature Granulocytes: 1 %
Lymphocytes Relative: 7 %
Lymphs Abs: 1 10*3/uL (ref 0.7–4.0)
MCH: 31 pg (ref 26.0–34.0)
MCHC: 32.3 g/dL (ref 30.0–36.0)
MCV: 96 fL (ref 80.0–100.0)
Monocytes Absolute: 1.1 10*3/uL — ABNORMAL HIGH (ref 0.1–1.0)
Monocytes Relative: 8 %
Neutro Abs: 12.3 10*3/uL — ABNORMAL HIGH (ref 1.7–7.7)
Neutrophils Relative %: 83 %
Platelets: 306 10*3/uL (ref 150–400)
RBC: 4.03 MIL/uL (ref 3.87–5.11)
RDW: 13.2 % (ref 11.5–15.5)
WBC: 14.6 10*3/uL — ABNORMAL HIGH (ref 4.0–10.5)
nRBC: 0 % (ref 0.0–0.2)

## 2021-06-26 LAB — RESP PANEL BY RT-PCR (FLU A&B, COVID) ARPGX2
Influenza A by PCR: NEGATIVE
Influenza B by PCR: NEGATIVE
SARS Coronavirus 2 by RT PCR: NEGATIVE

## 2021-06-26 MED ORDER — ONDANSETRON HCL 4 MG/2ML IJ SOLN
4.0000 mg | Freq: Once | INTRAMUSCULAR | Status: AC
  Administered 2021-06-26: 4 mg via INTRAVENOUS
  Filled 2021-06-26: qty 2

## 2021-06-26 MED ORDER — HYDROCODONE-ACETAMINOPHEN 5-325 MG PO TABS
1.0000 | ORAL_TABLET | Freq: Four times a day (QID) | ORAL | Status: DC | PRN
  Administered 2021-06-27 – 2021-06-28 (×6): 2 via ORAL
  Administered 2021-06-29: 1 via ORAL
  Administered 2021-06-29 – 2021-06-30 (×3): 2 via ORAL
  Administered 2021-06-30 (×2): 1 via ORAL
  Filled 2021-06-26 (×2): qty 2
  Filled 2021-06-26: qty 1
  Filled 2021-06-26 (×2): qty 2
  Filled 2021-06-26: qty 1
  Filled 2021-06-26 (×7): qty 2

## 2021-06-26 MED ORDER — HYDROMORPHONE HCL 1 MG/ML IJ SOLN
0.5000 mg | INTRAMUSCULAR | Status: DC | PRN
  Administered 2021-06-26 – 2021-06-28 (×5): 0.5 mg via INTRAVENOUS
  Filled 2021-06-26 (×3): qty 0.5
  Filled 2021-06-26: qty 1
  Filled 2021-06-26: qty 0.5

## 2021-06-26 MED ORDER — HYDROMORPHONE HCL 1 MG/ML IJ SOLN
1.0000 mg | Freq: Once | INTRAMUSCULAR | Status: AC
  Administered 2021-06-26: 1 mg via INTRAVENOUS
  Filled 2021-06-26: qty 1

## 2021-06-26 MED ORDER — PROPOFOL 10 MG/ML IV BOLUS
INTRAVENOUS | Status: AC | PRN
  Administered 2021-06-26 (×2): 30 mg via INTRAVENOUS
  Administered 2021-06-26: 50 mg via INTRAVENOUS
  Administered 2021-06-26 (×2): 20 mg via INTRAVENOUS

## 2021-06-26 MED ORDER — PROPOFOL 10 MG/ML IV BOLUS
200.0000 mg | Freq: Once | INTRAVENOUS | Status: AC
  Administered 2021-06-26: 150 mg via INTRAVENOUS
  Filled 2021-06-26: qty 20

## 2021-06-26 MED ORDER — ENOXAPARIN SODIUM 40 MG/0.4ML IJ SOSY
40.0000 mg | PREFILLED_SYRINGE | INTRAMUSCULAR | Status: DC
  Administered 2021-06-26 – 2021-06-30 (×5): 40 mg via SUBCUTANEOUS
  Filled 2021-06-26 (×5): qty 0.4

## 2021-06-26 MED ORDER — SODIUM CHLORIDE 0.9 % IV SOLN
INTRAVENOUS | Status: AC | PRN
  Administered 2021-06-26: 999 mL/h via INTRAVENOUS

## 2021-06-26 MED ORDER — POLYETHYLENE GLYCOL 3350 17 G PO PACK
17.0000 g | PACK | Freq: Every day | ORAL | Status: DC | PRN
  Administered 2021-07-03: 17 g via ORAL
  Filled 2021-06-26 (×2): qty 1

## 2021-06-26 NOTE — ED Triage Notes (Signed)
Pt arrived via [facility]. Pt was transported from workplace after tripping while going down the stairs. Per EMS pt stumbled down approx 3 stairs, denies pt hitting her head, denies LOC. Pt c/o R ankle pain and L knee pain, EMS reports pt has obvious deformity to R ankle and swelling to L knee with pulse, motor, and sensation intact. EMS advised they administered fentanyl en route (100 mcg) which improved pain from 9/10 to 3/10. Pt caox4 on arrival to ED.   BP 110/62 HR 68 RR 16 SpO2 99%

## 2021-06-26 NOTE — Sedation Documentation (Signed)
Xray to Bedside

## 2021-06-26 NOTE — ED Provider Notes (Signed)
[facility] EMERGENCY DEPARTMENT Provider Note   CSN: [id number] Arrival date & time: 06/26/21  1830     History Chief Complaint  Patient presents with   Fall    R. Ankle injury / L. Knee injury     Courtney Berg is a 65 y.o. female presenting with fall.  Patient states that she was walking down the stairs and tripped and stumbled down about 3 steps.  She states that she landed directly on the left knee and also noticed obvious deformity to the right foot.  Patient states that she did not hit her head and has no chest pain or abdominal pain or other injuries.  Patient was given fentanyl prior to arrival.  The history is provided by the patient.      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     Family History  Problem Relation Age of Onset   Diabetes Mother    Hypertension Father    Cancer Father        stomach cancer @ 69. colon cancer @ 47   Colon cancer Father    Diabetes Brother    Cancer Sister        leukemia    Social History   Tobacco Use   Smoking status: Former    Packs/day: 0.25    Years: 7.00    Pack years: 1.75    Types: Cigarettes    Start date: 11/22/1978    Quit date: 04/21/1986    Years since quitting: 35.2   Smokeless tobacco: Never  Substance Use Topics   Alcohol use: No    Alcohol/week: 0.0 standard drinks   Drug use: No    Home Medications Prior to Admission medications   Medication Sig Start Date End Date Taking? Authorizing Provider  COVID-19 mRNA vaccine, Pfizer, 30 MCG/0.3ML injection USE AS DIRECTED 11/26/20  11/26/21  Carlyle Basques, MD    Allergies    Patient has no known allergies.  Review of Systems   Review of Systems  Musculoskeletal:        L knee, R foot pain   All other systems reviewed and are negative.  Physical Exam Updated Vital Signs BP (!) 95/38   Pulse (!) 54   Temp 98.1 F (36.7 C) (Oral)   Resp 15   SpO2 100%   Physical Exam Vitals and nursing note reviewed.  Constitutional:      Comments: Uncomfortable   HENT:     Head: Normocephalic and atraumatic.     Comments: No scalp hematoma     Nose: Nose normal.     Mouth/Throat:     Mouth: Mucous membranes are moist.  Eyes:     Extraocular Movements: Extraocular movements intact.     Pupils: Pupils are equal, round, and reactive to light.  Neck:     Comments: No midline tenderness  Cardiovascular:     Rate and Rhythm: Normal rate and regular rhythm.     Pulses: Normal pulses.     Heart  sounds: Normal heart sounds.  Pulmonary:     Effort: Pulmonary effort is normal.     Breath sounds: Normal breath sounds.  Abdominal:     General: Abdomen is flat.     Palpations: Abdomen is soft.     Comments: No abdominal bruising or signs of trauma   Musculoskeletal:     Cervical back: Normal range of motion and neck supple.     Comments: Obvious deformity of the right ankle.  Patient has 2+ DP pulse and able to wiggle toes.  Patient does have tenderness of the right tib/fib as well.  Patient has obvious left patella deformity.  Patient unable to flex the left knee.  Patient has no obvious left femur or tib-fib or ankle deformity.  Patient has no spinal tenderness.  Patient's pelvis is stable  Neurological:     General: No focal deficit present.  Psychiatric:        Mood and Affect: Mood normal.        Behavior: Behavior normal.    ED Results / Procedures / Treatments   Labs (all labs ordered are listed, but only abnormal results are displayed) Labs Reviewed  CBC WITH DIFFERENTIAL/PLATELET - Abnormal; Notable for the  following components:      Result Value   WBC 14.6 (*)    Neutro Abs 12.3 (*)    Monocytes Absolute 1.1 (*)    Abs Immature Granulocytes 0.08 (*)    All other components within normal limits  COMPREHENSIVE METABOLIC PANEL - Abnormal; Notable for the following components:   Glucose, Bld 116 (*)    Total Protein 6.4 (*)    All other components within normal limits  RESP PANEL BY RT-PCR (FLU A&B, COVID) [id number]    EKG None  Radiology DG Pelvis 1-2 Views  Result Date: 06/26/2021 CLINICAL DATA:  Fall EXAM: PELVIS - 1-2 VIEW COMPARISON:  None. FINDINGS: No fracture or malalignment. Pubic symphysis and rami appear intact. Mild degenerative changes of both hips. IMPRESSION: No acute osseous abnormality. Electronically Signed   By: Donavan Foil M.D.   On: 06/26/2021 20:44   DG Tibia/Fibula Right  Result Date: 06/26/2021 CLINICAL DATA:  Fall EXAM: RIGHT TIBIA AND FIBULA - 2 VIEW COMPARISON:  None. FINDINGS: Proximal tibia and fibula appear intact. Acute comminuted and displaced distal fibular fracture. Acute displaced posterior malleolar fracture. Posterior dislocation of the talus IMPRESSION: Acute fracture dislocation at the ankle. Proximal tibia and fibula appear intact. Electronically Signed   By: Donavan Foil M.D.   On: 06/26/2021 20:49   DG Ankle 2 Views Right  Result Date: 06/26/2021 CLINICAL DATA:  Fall EXAM: RIGHT ANKLE - 2 VIEW COMPARISON:  None. FINDINGS: Acute comminuted fracture involving distal shaft of the fibula with slightly greater than 1 shaft diameter posterior displacement of distal fracture fragment. There is overriding. Acute displaced posterior malleolar fracture. Posterior dislocation of the talus with respect to the distal tibia. IMPRESSION: Acute comminuted distal tibial and fibular fractures with posterior dislocation of the talus with respect to the distal tibia Electronically Signed   By: Donavan Foil M.D.   On: 06/26/2021 20:46   DG Knee Left Port  Result Date:  06/26/2021 CLINICAL DATA:  Recent fall with knee pain, initial encounter EXAM: PORTABLE LEFT KNEE - 1-2 VIEW COMPARISON:  None. FINDINGS: There are changes consistent with a transverse fracture through the patella. There is distraction of the fracture fragments of at least 3 cm. Fat fluid effusion is noted between the fracture fragments.  Additionally on the frontal film there is fragmentation of the more superior fragment identified. IMPRESSION: Comminuted patellar fracture with associated joint effusion. Electronically Signed   By: Inez Catalina M.D.   On: 06/26/2021 22:01   DG Foot 2 Views Right  Result Date: 06/26/2021 CLINICAL DATA:  Tripping going downstairs EXAM: RIGHT FOOT - 2 VIEW COMPARISON:  Contemporary tibia/fibular radiographs FINDINGS: Supra syndesmotic distal fibular fracture with posterior displacement and angulation and mild comminution. Posterior superiorly displaced fracture of the posterior malleolus. No clear medial malleolar fracture. Lateral talar shift and talar tilting is noted compatible with ankle instability. Large ankle joint effusion. Extensive circumferential soft tissue swelling. Background of mild degenerative changes as well as chronic features suggesting some degree of synostosis of the distal tibiofibular articulation. Bidirectional calcaneal spurring is noted. 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 Associated effusion circumferential ankle swelling. Electronically Signed   By: Lovena Le M.D.   On: 06/26/2021 21:29   DG Femur Min 2 Views Right  Result Date: 06/26/2021 CLINICAL DATA:  Fall EXAM: RIGHT FEMUR 2 VIEWS COMPARISON:  None. FINDINGS: There is no evidence of fracture or other focal bone lesions. Soft tissues are unremarkable. IMPRESSION: Negative. Electronically Signed   By: Donavan Foil M.D.   On: 06/26/2021 20:46     Procedures .Sedation  Date/Time: 06/26/2021 10:20 PM Performed by: Drenda Freeze, MD Authorized by: Drenda Freeze, MD   Consent:    Consent obtained:  Verbal and written   Consent given by:  Patient   Risks discussed:  Prolonged hypoxia resulting in organ damage, inadequate sedation and respiratory compromise necessitating ventilatory assistance and intubation Universal protocol:    Procedure explained and questions answered to patient or proxy's satisfaction: yes     Relevant documents present and verified: yes     Immediately prior to procedure, a time out was called: yes   Indications:    Procedure performed:  Dislocation reduction   Procedure necessitating sedation performed by:  Physician performing sedation Pre-sedation assessment:    Time since last food or drink:  12 hours   ASA classification: class 1 - normal, healthy patient     Mallampati score:  I - soft palate, uvula, fauces, pillars visible   Pre-sedation assessments completed and reviewed: airway patency, cardiovascular function, mental status and respiratory function   Immediate pre-procedure details:    Reassessment: Patient reassessed immediately prior to procedure     Reviewed: vital signs, relevant labs/tests and NPO status     Verified: bag valve mask available   Procedure details (see MAR for exact dosages):    Preoxygenation:  Room air   Sedation:  Propofol   Intended level of sedation: deep   Intra-procedure monitoring:  Blood pressure monitoring, continuous capnometry, frequent LOC assessments and cardiac monitor   Intra-procedure events: hypoxia and respiratory depression     Intra-procedure management:  BVM ventilation   Total Provider sedation time (minutes):  30   CRITICAL CARE Performed by: Wandra Arthurs   Total critical care time: 30 minutes  Critical care time was exclusive of separately billable procedures and treating other patients.  Critical care was necessary to treat or  prevent imminent or life-threatening deterioration.  Critical care was time spent personally by me on the following activities: development of treatment plan with patient and/or surrogate as well as nursing, discussions with consultants, evaluation of  patient's response to treatment, examination of patient, obtaining history from patient or surrogate, ordering and performing treatments and interventions, ordering and review of laboratory studies, ordering and review of radiographic studies, pulse oximetry and re-evaluation of patient's condition.   Medications Ordered in ED Medications  propofol (DIPRIVAN) 10 mg/mL bolus/IV push 200 mg (has no administration in time range)  0.9 %  sodium chloride infusion (999 mL/hr Intravenous New Bag/Given 06/26/21 2200)  propofol (DIPRIVAN) 10 mg/mL bolus/IV push (20 mg Intravenous Given 06/26/21 2206)  HYDROmorphone (DILAUDID) injection 1 mg (1 mg Intravenous Given 06/26/21 1902)  ondansetron (ZOFRAN) injection 4 mg (4 mg Intravenous Given 06/26/21 1902)    ED Course  I have reviewed the triage vital signs and the nursing notes.  Pertinent labs & imaging results that were available during my care of the patient were reviewed by me and considered in my medical decision making (see chart for details).    MDM Rules/Calculators/A&P                          NATIYA WEXLER is a 65 y.o. female here with fall down 3 steps.  I am concerned for possible right ankle fracture dislocation.  Patient has obvious left knee deformity as well.  We will get preop labs and get lower extremity x-rays. No head or neck or chest or abdominal injuries   10 PM  Patient had right ankle fracture and dislocation and also left patella fracture.  Consult with Dr. Lyla Glassing from Ortho.  He recommend reduction in the ED and admission for pain control. He states that given that she is 20, he recommend medicine admission.  She will likely need surgery on Monday.  Will consult with Dr. Bobbye Morton  from surgery, who saw patient. No other traumatic injuries noted. Hospitalist to admit.   10:23 PM I performed sedation and Dr. Jeanell Sparrow performed reduction. Post reduction filmed showed successful reduction of fracture. Trauma saw patient. Hospitalist to admit.    Final Clinical Impression(s) / ED Diagnoses Final diagnoses:  Fall    Rx / DC Orders ED Discharge Orders     None        Drenda Freeze, MD 06/26/21 2304

## 2021-06-26 NOTE — ED Provider Notes (Signed)
.  Ortho Injury Treatment  Date/Time: 06/26/2021 10:16 PM Performed by: Pattricia Boss, MD Authorized by: Pattricia Boss, MD   Consent:    Consent obtained:  Verbal   Consent given by:  Patient   Risks discussed:  FractureInjury location: ankle Location details: right ankle Injury type: fracture-dislocation Pre-procedure neurovascular assessment: neurovascularly intact Pre-procedure distal perfusion: normal Pre-procedure neurological function: normal Pre-procedure range of motion: reduced  Anesthesia: Local anesthesia used: no  Patient sedated: Yes. Refer to sedation procedure documentation for details of sedation. Manipulation performed: yes Reduction successful: yes X-Savahna Casados confirmed reduction: yes Immobilization: splint Splint type: ankle stirrup Splint Applied by: Ortho Tech Supplies used: Ortho-Glass Post-procedure neurovascular assessment: post-procedure neurovascularly intact Post-procedure distal perfusion: normal Post-procedure neurological function: normal Post-procedure range of motion: unchanged      Pattricia Boss, MD 06/26/21 2248

## 2021-06-26 NOTE — Sedation Documentation (Signed)
Verbal order from Darl Householder, MD  to order portable Xray of ankle

## 2021-06-26 NOTE — H&P (Signed)
History and Physical   Courtney Berg [id number] DOB: August 03, 1956 DOA: 06/26/2021  PCP: Susy Frizzle, MD   Patient coming from: Home/work  Chief Complaint: Fall, ankle and knee pain  HPI: Courtney Berg is a 65 y.o. female with medical history significant of allergies who presents following a fall down stairs at work. As above patient tripped and fell down several stairs at work earlier today.  She did not hit her head she did not lose consciousness.  She landed on her left knee and also noted to have significant ankle pain and deformity of the ankle. She denies fevers, chills, chest pain, shortness of breath, abdominal pain, constipation, diarrhea, nausea, vomiting.   ED Course: Vital signs in the ED significant only for sinus bradycardia in the 40s to 60s.  Blood pressure in the 0000000 to 0000000 systolic.  Lab work-up showed CMP with glucose 116 and protein 6 4.  CBC showed leukocytosis to 14.6.  Respiratory panel flu COVID is pending.  Imaging studies included x-ray of the pelvis which showed no acute abnormality, x-ray of the right femur which showed no acute abnormality.  X-ray of the right tibia and fibula which showed apical dislocation.  Right ankle and foot x-ray which showed distal tibia and fibula fracture and dislocated ankle.  Left knee x-ray which showed a patellar fracture.  A right ankle x-ray was ordered as a repeat after reduction in the ED and is pending.  Patient received propofol, Dilaudid, Zofran, IV fluids in the ED.  Review of Systems: As per HPI otherwise all other systems reviewed and are negative.  Past Medical History:  Diagnosis Date   Environmental allergies    pt still getting workup [id number]   Miscarriage     Past Surgical History:  Procedure Laterality Date   TONSILLECTOMY     WISDOM TOOTH EXTRACTION      Social History  reports that she quit smoking about 35 years ago. Her smoking use included cigarettes. She started smoking about 42 years  ago. She has a 1.75 pack-year smoking history. She has never used smokeless tobacco. She reports that she does not drink alcohol and does not use drugs.  No Known Allergies  Family History  Problem Relation Age of Onset   Diabetes Mother    Hypertension Father    Cancer Father        stomach cancer @ 92. colon cancer @ 81   Colon cancer Father    Diabetes Brother    Cancer Sister        leukemia  Reviewed on admission  Prior to Admission medications   Medication Sig Start Date End Date Taking? Authorizing Provider  COVID-19 mRNA vaccine, Pfizer, 30 MCG/0.3ML injection USE AS DIRECTED 11/26/20 11/26/21  Carlyle Basques, MD    Physical Exam: Vitals:   06/26/21 2215 06/26/21 2220 06/26/21 2225 06/26/21 2230  BP: (!) 95/38 (!) 101/46 (!) 106/51 (!) 112/48  Pulse: (!) 54 (!) 51 (!) 52 (!) 52  Resp: '15 11 14 14  '$ Temp:      TempSrc:      SpO2: 100% 100% 100% 100%   Physical Exam Constitutional:      General: She is not in acute distress.    Appearance: Normal appearance. She is obese.  HENT:     Head: Normocephalic and atraumatic.     Mouth/Throat:     Mouth: Mucous membranes are moist.     Pharynx: Oropharynx is clear.  Eyes:  Extraocular Movements: Extraocular movements intact.     Pupils: Pupils are equal, round, and reactive to light.  Cardiovascular:     Rate and Rhythm: Normal rate and regular rhythm.     Pulses: Normal pulses.     Heart sounds: Normal heart sounds.  Pulmonary:     Effort: Pulmonary effort is normal. No respiratory distress.     Breath sounds: Normal breath sounds.  Abdominal:     General: Bowel sounds are normal. There is no distension.     Palpations: Abdomen is soft.     Tenderness: There is no abdominal tenderness.  Musculoskeletal:     Comments: Right lower leg are splinted and wrapped.  Left knee in orthotic.  Skin:    General: Skin is warm and dry.  Neurological:     General: No focal deficit present.     Mental Status: Mental status  is at baseline.   Labs on Admission: I have personally reviewed following labs and imaging studies  CBC: Recent Labs  Lab 06/26/21 1900  WBC 14.6*  NEUTROABS 12.3*  HGB 12.5  HCT 38.7  MCV 96.0  PLT [id number]    Basic Metabolic Panel: Recent Labs  Lab 06/26/21 1900  NA 139  K 3.5  CL 106  CO2 24  GLUCOSE 116*  BUN 11  CREATININE 0.80  CALCIUM 8.9    GFR: CrCl cannot be calculated (Unknown ideal weight.).  Liver Function Tests: Recent Labs  Lab 06/26/21 1900  AST 22  ALT 11  ALKPHOS 42  BILITOT 0.8  PROT 6.4*  ALBUMIN 3.7    Urine analysis: No results found for: COLORURINE, APPEARANCEUR, LABSPEC, PHURINE, GLUCOSEU, HGBUR, BILIRUBINUR, KETONESUR, PROTEINUR, UROBILINOGEN, NITRITE, LEUKOCYTESUR  Radiological Exams on Admission: DG Pelvis 1-2 Views  Result Date: 06/26/2021 CLINICAL DATA:  Fall EXAM: PELVIS - 1-2 VIEW COMPARISON:  None. FINDINGS: No fracture or malalignment. Pubic symphysis and rami appear intact. Mild degenerative changes of both hips. IMPRESSION: No acute osseous abnormality. Electronically Signed   By: Donavan Foil M.D.   On: 06/26/2021 20:44   DG Tibia/Fibula Right  Result Date: 06/26/2021 CLINICAL DATA:  Fall EXAM: RIGHT TIBIA AND FIBULA - 2 VIEW COMPARISON:  None. FINDINGS: Proximal tibia and fibula appear intact. Acute comminuted and displaced distal fibular fracture. Acute displaced posterior malleolar fracture. Posterior dislocation of the talus IMPRESSION: Acute fracture dislocation at the ankle. Proximal tibia and fibula appear intact. Electronically Signed   By: Donavan Foil M.D.   On: 06/26/2021 20:49   DG Ankle 2 Views Right  Result Date: 06/26/2021 CLINICAL DATA:  Status post reduction and casting EXAM: RIGHT ANKLE - 2 VIEW COMPARISON:  Films from earlier in the same day. FINDINGS: The fracture fragments have been reduced and the posterior talar dislocation has also been reduced. Splinting material is noted. IMPRESSION: Interval reduction  of fractures and talar dislocation. Electronically Signed   By: Inez Catalina M.D.   On: 06/26/2021 22:35   DG Ankle 2 Views Right  Result Date: 06/26/2021 CLINICAL DATA:  Fall EXAM: RIGHT ANKLE - 2 VIEW COMPARISON:  None. FINDINGS: Acute comminuted fracture involving distal shaft of the fibula with slightly greater than 1 shaft diameter posterior displacement of distal fracture fragment. There is overriding. Acute displaced posterior malleolar fracture. Posterior dislocation of the talus with respect to the distal tibia. IMPRESSION: Acute comminuted distal tibial and fibular fractures with posterior dislocation of the talus with respect to the distal tibia Electronically Signed   By: Donavan Foil  M.D.   On: 06/26/2021 20:46   DG Knee Left Port  Result Date: 06/26/2021 CLINICAL DATA:  Recent fall with knee pain, initial encounter EXAM: PORTABLE LEFT KNEE - 1-2 VIEW COMPARISON:  None. FINDINGS: There are changes consistent with a transverse fracture through the patella. There is distraction of the fracture fragments of at least 3 cm. Fat fluid effusion is noted between the fracture fragments. Additionally on the frontal film there is fragmentation of the more superior fragment identified. IMPRESSION: Comminuted patellar fracture with associated joint effusion. Electronically Signed   By: Inez Catalina M.D.   On: 06/26/2021 22:01   DG Foot 2 Views Right  Result Date: 06/26/2021 CLINICAL DATA:  Tripping going downstairs EXAM: RIGHT FOOT - 2 VIEW COMPARISON:  Contemporary tibia/fibular radiographs FINDINGS: Supra syndesmotic distal fibular fracture with posterior displacement and angulation and mild comminution. Posterior superiorly displaced fracture of the posterior malleolus. No clear medial malleolar fracture. Lateral talar shift and talar tilting is noted compatible with ankle instability. Large ankle joint effusion. Extensive circumferential soft tissue swelling. Background of mild degenerative changes as  well as chronic features suggesting some degree of synostosis of the distal tibiofibular articulation. Bidirectional calcaneal spurring is noted. 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 Associated effusion circumferential ankle swelling. Electronically Signed   By: Lovena Le M.D.   On: 06/26/2021 21:29   DG Femur Min 2 Views Right  Result Date: 06/26/2021 CLINICAL DATA:  Fall EXAM: RIGHT FEMUR 2 VIEWS COMPARISON:  None. FINDINGS: There is no evidence of fracture or other focal bone lesions. Soft tissues are unremarkable. IMPRESSION: Negative. Electronically Signed   By: Donavan Foil M.D.   On: 06/26/2021 20:46    EKG: Not yet performed  Assessment/Plan Principal Problem:   Tibia/fibula fracture, right, closed, initial encounter Active Problems:   Allergies   Left patella fracture  Tibia-fibula fracture on right Left patella fracture > Patient fell down stairs at work earlier today. > Imaging showed ankle dislocation which was reduced in the ED, tib-fib fracture on right.  Left sided patella fracture. > Ortho consulted in the ED who recommended patient admission and will see the patient in the morning.  Plan for surgery likely on Monday. - Monitor on telemetry given sinus bradycardia in ED and receiving opioids for pain - Norco as needed for pain - Ice to affected area and immobilization per Ortho/Orthotec - Nonweightbearing for now - Will be able to eat and will proceed with DVT prophylaxis as surgery not anticipated tomorrow  Sinus bradycardia > Sinus bradycardia in the 40s to 60s in the ED. Was worse peri-procedurally when they set her ankle in the ED. > Has remained asymptomatic. > We will monitor as she is receiving pain medication - Remain on telemetry - EKG    DVT prophylaxis: Lovenox  Code Status:   Full  Family Communication:  None on  admission Disposition Plan:   Patient is from:  Home  Anticipated DC to:  Home  Anticipated DC date:  1 to 4 days  Anticipated DC barriers: None  Consults called:  Orthopedics, consulted by EDP.  Will see the patient.  Admission status:  Observation, surgical telemetry   Severity of Illness: The appropriate patient status for this patient is OBSERVATION. Observation status is judged to be reasonable and necessary in order to provide the required intensity of service to ensure the patient's  safety. The patient's presenting symptoms, physical exam findings, and initial radiographic and laboratory data in the context of their medical condition is felt to place them at decreased risk for further clinical deterioration. Furthermore, it is anticipated that the patient will be medically stable for discharge from the hospital within 2 midnights of admission. The following factors support the patient status of observation.   " The patient's presenting symptoms include fall, pain, right ankle deformity. " The physical exam findings include right ankle swelling and pain, left knee pain. " The initial radiographic and laboratory data are  Lab work-up showed CMP with glucose 116 and protein 6 4.  CBC showed leukocytosis to 14.6.  Respiratory panel flu COVID is pending.  Imaging studies included x-ray of the pelvis which showed no acute abnormality, x-ray of the right femur which showed no acute abnormality.  X-ray of the right tibia and fibula which showed apical dislocation.  Right ankle and foot x-ray which showed distal tibia and fibula fracture and dislocated ankle.  Left knee x-ray which showed a patellar fracture.  A right ankle x-ray was ordered as a repeat after reduction in the ED and is pending.   Marcelyn Bruins MD [facility]  How to contact the [facility] Attending or Consulting provider [place] or covering provider during after hours [place], for this patient?   Check the care team in [facility]  and look for a) attending/consulting [facility] provider listed and b) the [facility] team listed Log into [email or web address] and use [place]'s universal password to access. If you do not have the password, please contact the hospital operator. Locate the [facility] provider you are looking for under [facility] and page to a number that you can be directly reached. If you still have difficulty reaching the provider, please page the [facility] (Director on Call) for the Hospitalists listed on amion for assistance.  06/26/2021, 10:43 PM

## 2021-06-26 NOTE — Progress Notes (Signed)
Orthopedic Tech Progress Note Patient Details:  Courtney Berg 1956/11/03 [id number] Reduction with MD Ortho Devices Type of Ortho Device: Stirrup splint, Post (short) splint, Knee Immobilizer Splint Material: Fiberglass Ortho Device/Splint Location: RLE,LLE Ortho Device/Splint Interventions: Ordered, Application, Adjustment   Post Interventions Patient Tolerated: Other (comment) Instructions Provided: Other (comment)  [name] 06/26/2021, 10:45 PM

## 2021-06-27 ENCOUNTER — Observation Stay (HOSPITAL_COMMUNITY): Payer: Worker's Compensation

## 2021-06-27 ENCOUNTER — Other Ambulatory Visit: Payer: Self-pay

## 2021-06-27 DIAGNOSIS — S82002A Unspecified fracture of left patella, initial encounter for closed fracture: Secondary | ICD-10-CM | POA: Diagnosis not present

## 2021-06-27 DIAGNOSIS — T7840XA Allergy, unspecified, initial encounter: Secondary | ICD-10-CM | POA: Diagnosis not present

## 2021-06-27 DIAGNOSIS — E871 Hypo-osmolality and hyponatremia: Secondary | ICD-10-CM | POA: Diagnosis present

## 2021-06-27 DIAGNOSIS — S82851A Displaced trimalleolar fracture of right lower leg, initial encounter for closed fracture: Secondary | ICD-10-CM | POA: Diagnosis present

## 2021-06-27 DIAGNOSIS — J9690 Respiratory failure, unspecified, unspecified whether with hypoxia or hypercapnia: Secondary | ICD-10-CM | POA: Diagnosis present

## 2021-06-27 DIAGNOSIS — Z833 Family history of diabetes mellitus: Secondary | ICD-10-CM | POA: Diagnosis not present

## 2021-06-27 DIAGNOSIS — S82891A Other fracture of right lower leg, initial encounter for closed fracture: Secondary | ICD-10-CM | POA: Diagnosis not present

## 2021-06-27 DIAGNOSIS — S82002S Unspecified fracture of left patella, sequela: Secondary | ICD-10-CM | POA: Diagnosis not present

## 2021-06-27 DIAGNOSIS — S82201A Unspecified fracture of shaft of right tibia, initial encounter for closed fracture: Secondary | ICD-10-CM | POA: Diagnosis not present

## 2021-06-27 DIAGNOSIS — D72829 Elevated white blood cell count, unspecified: Secondary | ICD-10-CM | POA: Diagnosis not present

## 2021-06-27 DIAGNOSIS — R338 Other retention of urine: Secondary | ICD-10-CM

## 2021-06-27 DIAGNOSIS — F411 Generalized anxiety disorder: Secondary | ICD-10-CM | POA: Diagnosis not present

## 2021-06-27 DIAGNOSIS — Z6830 Body mass index (BMI) 30.0-30.9, adult: Secondary | ICD-10-CM | POA: Diagnosis not present

## 2021-06-27 DIAGNOSIS — D649 Anemia, unspecified: Secondary | ICD-10-CM | POA: Diagnosis not present

## 2021-06-27 DIAGNOSIS — E669 Obesity, unspecified: Secondary | ICD-10-CM | POA: Diagnosis present

## 2021-06-27 DIAGNOSIS — S82401A Unspecified fracture of shaft of right fibula, initial encounter for closed fracture: Secondary | ICD-10-CM | POA: Diagnosis not present

## 2021-06-27 DIAGNOSIS — S9304XA Dislocation of right ankle joint, initial encounter: Secondary | ICD-10-CM

## 2021-06-27 DIAGNOSIS — I951 Orthostatic hypotension: Secondary | ICD-10-CM | POA: Diagnosis present

## 2021-06-27 DIAGNOSIS — E559 Vitamin D deficiency, unspecified: Secondary | ICD-10-CM | POA: Diagnosis present

## 2021-06-27 DIAGNOSIS — Z87891 Personal history of nicotine dependence: Secondary | ICD-10-CM | POA: Diagnosis not present

## 2021-06-27 DIAGNOSIS — S82042A Displaced comminuted fracture of left patella, initial encounter for closed fracture: Secondary | ICD-10-CM | POA: Diagnosis present

## 2021-06-27 DIAGNOSIS — D62 Acute posthemorrhagic anemia: Secondary | ICD-10-CM | POA: Diagnosis present

## 2021-06-27 DIAGNOSIS — R55 Syncope and collapse: Secondary | ICD-10-CM | POA: Diagnosis not present

## 2021-06-27 DIAGNOSIS — Z20822 Contact with and (suspected) exposure to covid-19: Secondary | ICD-10-CM | POA: Diagnosis present

## 2021-06-27 DIAGNOSIS — S82891S Other fracture of right lower leg, sequela: Secondary | ICD-10-CM | POA: Diagnosis not present

## 2021-06-27 DIAGNOSIS — Z8249 Family history of ischemic heart disease and other diseases of the circulatory system: Secondary | ICD-10-CM | POA: Diagnosis not present

## 2021-06-27 DIAGNOSIS — W109XXA Fall (on) (from) unspecified stairs and steps, initial encounter: Secondary | ICD-10-CM | POA: Diagnosis present

## 2021-06-27 DIAGNOSIS — Y9301 Activity, walking, marching and hiking: Secondary | ICD-10-CM | POA: Diagnosis present

## 2021-06-27 DIAGNOSIS — L03116 Cellulitis of left lower limb: Secondary | ICD-10-CM | POA: Diagnosis not present

## 2021-06-27 LAB — BASIC METABOLIC PANEL
Anion gap: 7 (ref 5–15)
BUN: 13 mg/dL (ref 8–23)
CO2: 27 mmol/L (ref 22–32)
Calcium: 8.7 mg/dL — ABNORMAL LOW (ref 8.9–10.3)
Chloride: 104 mmol/L (ref 98–111)
Creatinine, Ser: 0.82 mg/dL (ref 0.44–1.00)
GFR, Estimated: >60 mL/min (ref >60)
Glucose, Bld: 151 mg/dL — ABNORMAL HIGH (ref 70–99)
Potassium: 4.2 mmol/L (ref 3.5–5.1)
Sodium: 138 mmol/L (ref 135–145)

## 2021-06-27 LAB — CBC
HCT: 34.7 % — ABNORMAL LOW (ref 36.0–46.0)
Hemoglobin: 11.5 g/dL — ABNORMAL LOW (ref 12.0–15.0)
MCH: 31.3 pg (ref 26.0–34.0)
MCHC: 33.1 g/dL (ref 30.0–36.0)
MCV: 94.6 fL (ref 80.0–100.0)
Platelets: 294 10*3/uL (ref 150–400)
RBC: 3.67 MIL/uL — ABNORMAL LOW (ref 3.87–5.11)
RDW: 13.4 % (ref 11.5–15.5)
WBC: 10.4 10*3/uL (ref 4.0–10.5)
nRBC: 0 % (ref 0.0–0.2)

## 2021-06-27 LAB — HIV ANTIBODY (ROUTINE TESTING W REFLEX): HIV Screen 4th Generation wRfx: NONREACTIVE

## 2021-06-27 NOTE — Progress Notes (Signed)
[facility]  PROGRESS NOTE  Courtney Berg [id number] DOB: 29-Nov-1955 DOA: 06/26/2021 PCP: Susy Frizzle, MD   Brief HPI:   65 year old female with history of allergies who presented to the ED after Berg downstairs at work.  She landed on her left knee and also noted to have significant ankle pain and deformity.  Left knee x-ray showed patella fracture.  Right ankle and foot x-ray showed distal tibia and fibula fracture and dislocated ankle.  Ankle was reduced in the ED.  Orthopedics was consulted    Subjective   Patient said that she has not urinated since she came to the hospital.   Assessment/Plan:    Trimalleolar ankle fracture -Orthopedics planning ORIF -Management per orthopedics  Left patella fracture -Left leg in knee immobilizer  Urinary retention -Bladder scan obtained today shows 800 cc urine -Likely induced by opioids -Start In-and-out cath every 6 hours as needed      Scheduled medications:    enoxaparin (LOVENOX) injection  40 mg Subcutaneous Q24H         Data Reviewed:   CBG:  No results for input(s): GLUCAP in the last 168 hours.  SpO2: 100 % O2 Flow Rate (L/min): 15 L/min    Vitals:   06/26/21 2345 06/27/21 0000 06/27/21 0051 06/27/21 0747  BP: (!) 116/43 (!) 106/48 110/61 (!) 109/48  Pulse: (!) 53 (!) 49 (!) 51 (!) 50  Resp: '19 16 18 20  '$ Temp:   98.1 F (36.7 C) 98.3 F (36.8 C)  TempSrc:   Oral Oral  SpO2: 100% 100% 100% 100%     Intake/Output Summary (Last 24 hours) at 06/27/2021 1400 Last data filed at 06/27/2021 1100 Gross per 24 hour  Intake 1120 ml  Output 800 ml  Net 320 ml    08/04 1901 - 08/06 0700 In: 1120 [P.O.:120; I.V.:1000] Out: -   There were no vitals filed for this visit.  CBC:  Recent Labs  Lab 06/26/21 1900 06/27/21 0222  WBC 14.6* 10.4  HGB 12.5 11.5*  HCT 38.7 34.7*  PLT 306 294  MCV 96.0 94.6  MCH 31.0 31.3  MCHC 32.3 33.1  RDW 13.2 13.4  LYMPHSABS 1.0  --   MONOABS 1.1*   --   EOSABS 0.0  --   BASOSABS 0.1  --     Complete metabolic panel:  Recent Labs  Lab 06/26/21 1900 06/27/21 0222  NA 139 138  K 3.5 4.2  CL 106 104  CO2 24 27  GLUCOSE 116* 151*  BUN 11 13  CREATININE 0.80 0.82  CALCIUM 8.9 8.7*  AST 22  --   ALT 11  --   ALKPHOS 42  --   BILITOT 0.8  --   ALBUMIN 3.7  --     No results for input(s): LIPASE, AMYLASE in the last 168 hours.  Recent Labs  Lab 06/26/21 2113  SARSCOV2NAA NEGATIVE    ------------------------------------------------------------------------------------------------------------------ No results for input(s): CHOL, HDL, LDLCALC, TRIG, CHOLHDL, LDLDIRECT in the last 72 hours.  No results found for: HGBA1C ------------------------------------------------------------------------------------------------------------------ No results for input(s): TSH, T4TOTAL, T3FREE, THYROIDAB in the last 72 hours.  Invalid input(s): FREET3 ------------------------------------------------------------------------------------------------------------------ No results for input(s): VITAMINB12, FOLATE, FERRITIN, TIBC, IRON, RETICCTPCT in the last 72 hours.  Coagulation profile No results for input(s): INR, PROTIME in the last 168 hours. No results for input(s): DDIMER in the last 72 hours.  Cardiac Enzymes No results for input(s): CKTOTAL, CKMB, CKMBINDEX, TROPONINI in the last 168 hours.  ------------------------------------------------------------------------------------------------------------------  No results found for: BNP   Antibiotics: Anti-infectives (From admission, onward)    None        Radiology Reports  DG Pelvis 1-2 Views  Result Date: 06/26/2021 CLINICAL DATA:  Berg EXAM: PELVIS - 1-2 VIEW COMPARISON:  None. FINDINGS: No fracture or malalignment. Pubic symphysis and rami appear intact. Mild degenerative changes of both hips. IMPRESSION: No acute osseous abnormality. Electronically Signed   By: Donavan Foil M.D.   On: 06/26/2021 20:44   DG Tibia/Fibula Right  Result Date: 06/26/2021 CLINICAL DATA:  Berg EXAM: RIGHT TIBIA AND FIBULA - 2 VIEW COMPARISON:  None. FINDINGS: Proximal tibia and fibula appear intact. Acute comminuted and displaced distal fibular fracture. Acute displaced posterior malleolar fracture. Posterior dislocation of the talus IMPRESSION: Acute fracture dislocation at the ankle. Proximal tibia and fibula appear intact. Electronically Signed   By: Donavan Foil M.D.   On: 06/26/2021 20:49   DG Ankle 2 Views Right  Result Date: 06/26/2021 CLINICAL DATA:  Status post reduction and casting EXAM: RIGHT ANKLE - 2 VIEW COMPARISON:  Films from earlier in the same day. FINDINGS: The fracture fragments have been reduced and the posterior talar dislocation has also been reduced. Splinting material is noted. IMPRESSION: Interval reduction of fractures and talar dislocation. Electronically Signed   By: Inez Catalina M.D.   On: 06/26/2021 22:35   DG Ankle 2 Views Right  Result Date: 06/26/2021 CLINICAL DATA:  Berg EXAM: RIGHT ANKLE - 2 VIEW COMPARISON:  None. FINDINGS: Acute comminuted fracture involving distal shaft of the fibula with slightly greater than 1 shaft diameter posterior displacement of distal fracture fragment. There is overriding. Acute displaced posterior malleolar fracture. Posterior dislocation of the talus with respect to the distal tibia. IMPRESSION: Acute comminuted distal tibial and fibular fractures with posterior dislocation of the talus with respect to the distal tibia Electronically Signed   By: Donavan Foil M.D.   On: 06/26/2021 20:46   CT ANKLE RIGHT WO CONTRAST  Result Date: 06/27/2021 CLINICAL DATA:  Complex ankle fractures. EXAM: CT OF THE RIGHT ANKLE WITHOUT CONTRAST TECHNIQUE: Multidetector CT imaging of the right ankle was performed according to the standard protocol. Multiplanar CT image reconstructions were also generated. COMPARISON:  Radiographs 06/26/2021  FINDINGS: Complex comminuted spiral type fracture involving the distal fibular shaft at and above the level of the ankle mortise. Maximum posterior displacement of 11 mm. Oblique coursing comminuted longitudinal intra-articular fracture involving the posterior malleolus of the tibia. Maximum posterior displacement is 7 mm. Maximum gap at the articular surface is 5 mm. The medial malleolus is intact. The medial mortise is slightly widened and a few tiny bony densities are noted near the talus. Possible deltoid ligament injury. There is a small impaction type fracture involving the medial and anterior aspect of the talar dome. The subtalar joints are maintained. The sinus tarsi is unremarkable. No fracture of the calcaneus or midfoot bony structures. Mild midfoot degenerative changes are noted. Grossly by CT the medial and lateral and anterior ankle tendons are intact. The Achilles tendon is intact. There is a moderate-sized calcaneal heel spur noted. Reading remarkable running IMPRESSION: 1. Complex comminuted spiral type fracture involving the distal fibular shaft at and above the level of the ankle mortise. 2. Oblique coursing comminuted longitudinal intra-articular fracture involving the posterior malleolus of the tibia. Maximum posterior displacement is 7 mm. Maximum gap at the articular surface is 5 mm. 3. Small impaction type fracture involving the medial and anterior aspect of the  talar dome. 4. Slight widening of the medial mortise and a few tiny bony densities near the talus. Possible deltoid ligament injury. Electronically Signed   By: Marijo Sanes M.D.   On: 06/27/2021 09:57   DG Chest Port 1 View  Result Date: 06/27/2021 CLINICAL DATA:  Respiratory failure, preoperative EXAM: PORTABLE CHEST 1 VIEW COMPARISON:  CT 01/24/2018 FINDINGS: Streaky opacities towards the lung bases favoring atelectatic change. No consolidative process or convincing features of edema. The cardiomediastinal contours are  unremarkable. No acute osseous or soft tissue abnormality. Degenerative changes are present in the imaged spine and shoulders. Telemetry leads overlie the chest. IMPRESSION: Streaky opacities towards the lung bases, favoring atelectasis. No other acute cardiopulmonary abnormality. Electronically Signed   By: Lovena Le M.D.   On: 06/27/2021 01:34   DG Knee Left Port  Result Date: 06/26/2021 CLINICAL DATA:  Recent Berg with knee pain, initial encounter EXAM: PORTABLE LEFT KNEE - 1-2 VIEW COMPARISON:  None. FINDINGS: There are changes consistent with a transverse fracture through the patella. There is distraction of the fracture fragments of at least 3 cm. Fat fluid effusion is noted between the fracture fragments. Additionally on the frontal film there is fragmentation of the more superior fragment identified. IMPRESSION: Comminuted patellar fracture with associated joint effusion. Electronically Signed   By: Inez Catalina M.D.   On: 06/26/2021 22:01   DG Foot 2 Views Right  Result Date: 06/26/2021 CLINICAL DATA:  Tripping going downstairs EXAM: RIGHT FOOT - 2 VIEW COMPARISON:  Contemporary tibia/fibular radiographs FINDINGS: Supra syndesmotic distal fibular fracture with posterior displacement and angulation and mild comminution. Posterior superiorly displaced fracture of the posterior malleolus. No clear medial malleolar fracture. Lateral talar shift and talar tilting is noted compatible with ankle instability. Large ankle joint effusion. Extensive circumferential soft tissue swelling. Background of mild degenerative changes as well as chronic features suggesting some degree of synostosis of the distal tibiofibular articulation. Bidirectional calcaneal spurring is noted. 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 Associated effusion circumferential ankle swelling.  Electronically Signed   By: Lovena Le M.D.   On: 06/26/2021 21:29   DG Femur Min 2 Views Right  Result Date: 06/26/2021 CLINICAL DATA:  Berg EXAM: RIGHT FEMUR 2 VIEWS COMPARISON:  None. FINDINGS: There is no evidence of fracture or other focal bone lesions. Soft tissues are unremarkable. IMPRESSION: Negative. Electronically Signed   By: Donavan Foil M.D.   On: 06/26/2021 20:46      DVT prophylaxis: Lovenox  Code Status: Full code  Family Communication: No family at bedside   Consultants: Orthopedics  Procedures:     Objective    Physical Examination:  General-appears in no acute distress Heart-S1-S2, regular, no murmur auscultated Lungs-clear to auscultation bilaterally, no wheezing or crackles auscultated Abdomen-soft, nontender, no organomegaly Extremities-no edema in the lower extremities Neuro-alert, oriented x3, no focal deficit noted  Status is: Inpatient  Dispo: The patient is from: Home              Anticipated d/c is to: Home versus skilled nursing facility              Anticipated d/c date is: 07/01/2021              Patient currently not stable for discharge  Barrier to discharge-ongoing management for ankle fracture  COVID-19 Labs  No results for input(s): DDIMER, FERRITIN,  LDH, CRP in the last 72 hours.  Lab Results  Component Value Date   [place] NEGATIVE 06/26/2021    Microbiology  Recent Results (from the past 240 hour(s))  Resp Panel by RT-PCR (Flu A&B, Covid) Nasopharyngeal Swab     Status: None   Collection Time: 06/26/21  9:13 PM   Specimen: Nasopharyngeal Swab; Nasopharyngeal(NP) swabs in vial transport medium  Result Value Ref Range Status   SARS Coronavirus 2 by RT PCR NEGATIVE NEGATIVE Final    Comment: (NOTE) SARS-CoV-2 target nucleic acids are NOT DETECTED.  The SARS-CoV-2 RNA is generally detectable in upper respiratory specimens during the acute phase of infection. The lowest concentration of SARS-CoV-2 viral copies  this assay can detect is 138 copies/mL. A negative result does not preclude SARS-Cov-2 infection and should not be used as the sole basis for treatment or other patient management decisions. A negative result may occur with  improper specimen collection/handling, submission of specimen other than nasopharyngeal swab, presence of viral mutation(s) within the areas targeted by this assay, and inadequate number of viral copies(<138 copies/mL). A negative result must be combined with clinical observations, patient history, and epidemiological information. The expected result is Negative.  Fact Sheet for Patients:  EntrepreneurPulse.com.au  Fact Sheet for Healthcare Providers:  IncredibleEmployment.be  This test is no t yet approved or cleared by the [place] FDA and  has been authorized for detection and/or diagnosis of SARS-CoV-2 by FDA under an Emergency Use Authorization (EUA). This EUA will remain  in effect (meaning this test can be used) for the duration of the COVID-19 declaration under Section 564(b)(1) of the Act, 21 U.S.C.section 360bbb-3(b)(1), unless the authorization is terminated  or revoked sooner.       Influenza A by PCR NEGATIVE NEGATIVE Final   Influenza B by PCR NEGATIVE NEGATIVE Final    Comment: (NOTE) The Xpert Xpress SARS-CoV-2/FLU/RSV plus assay is intended as an aid in the diagnosis of influenza from Nasopharyngeal swab specimens and should not be used as a sole basis for treatment. Nasal washings and aspirates are unacceptable for Xpert Xpress SARS-CoV-2/FLU/RSV testing.  Fact Sheet for Patients: EntrepreneurPulse.com.au  Fact Sheet for Healthcare Providers: IncredibleEmployment.be  This test is not yet approved or cleared by the [place] FDA and has been authorized for detection and/or diagnosis of SARS-CoV-2 by FDA under an Emergency Use Authorization (EUA). This EUA  will remain in effect (meaning this test can be used) for the duration of the COVID-19 declaration under Section 564(b)(1) of the Act, 21 U.S.C. section 360bbb-3(b)(1), unless the authorization is terminated or revoked.  Performed at [facility], [place] [address], [place], [place] [address]              [facility] If 7PM-7AM, please contact night-coverage at [email or web address], Office  [phone]   06/27/2021, 2:00 PM  LOS: 0 days

## 2021-06-27 NOTE — Consult Note (Signed)
Reason for Consult/Chief Complaint: trauma consult Consultant: Darl Householder, MD  Courtney Berg is an 65 y.o. female.   HPI: 44F s/p mechanical fall down approximately 3 stairs, reports that her heel slipped. Denies hitting her head or LOC. Sustained injuries to b/l LE. Denies any other complaints.   Past Medical History:  Diagnosis Date   Environmental allergies    pt still getting workup [id number]   Miscarriage     Past Surgical History:  Procedure Laterality Date   TONSILLECTOMY     WISDOM TOOTH EXTRACTION      Family History  Problem Relation Age of Onset   Diabetes Mother    Hypertension Father    Cancer Father        stomach cancer @ 36. colon cancer @ 29   Colon cancer Father    Diabetes Brother    Cancer Sister        leukemia    Social History:  reports that she quit smoking about 35 years ago. Her smoking use included cigarettes. She started smoking about 42 years ago. She has a 1.75 pack-year smoking history. She has never used smokeless tobacco. She reports that she does not drink alcohol and does not use drugs.  Allergies: No Known Allergies  Medications: I have reviewed the patient's current medications.  Results for orders placed or performed during the hospital encounter of 06/26/21 (from the past 48 hour(s))  CBC with Differential/Platelet     Status: Abnormal   Collection Time: 06/26/21  7:00 PM  Result Value Ref Range   WBC 14.6 (H) 4.0 - 10.5 K/uL   RBC 4.03 3.87 - 5.11 MIL/uL   Hemoglobin 12.5 12.0 - 15.0 g/dL   HCT 38.7 36.0 - 46.0 %   MCV 96.0 80.0 - 100.0 fL   MCH 31.0 26.0 - 34.0 pg   MCHC 32.3 30.0 - 36.0 g/dL   RDW 13.2 11.5 - 15.5 %   Platelets 306 150 - 400 K/uL   nRBC 0.0 0.0 - 0.2 %   Neutrophils Relative % 83 %   Neutro Abs 12.3 (H) 1.7 - 7.7 K/uL   Lymphocytes Relative 7 %   Lymphs Abs 1.0 0.7 - 4.0 K/uL   Monocytes Relative 8 %   Monocytes Absolute 1.1 (H) 0.1 - 1.0 K/uL   Eosinophils Relative 0 %   Eosinophils Absolute 0.0  0.0 - 0.5 K/uL   Basophils Relative 1 %   Basophils Absolute 0.1 0.0 - 0.1 K/uL   Immature Granulocytes 1 %   Abs Immature Granulocytes 0.08 (H) 0.00 - 0.07 K/uL    Comment: Performed at [facility], [address], [place],  [address]  Comprehensive metabolic panel     Status: Abnormal   Collection Time: 06/26/21  7:00 PM  Result Value Ref Range   Sodium 139 135 - 145 mmol/L   Potassium 3.5 3.5 - 5.1 mmol/L   Chloride 106 98 - 111 mmol/L   CO2 24 22 - 32 mmol/L   Glucose, Bld 116 (H) 70 - 99 mg/dL    Comment: Glucose reference range applies only to samples taken after fasting for at least 8 hours.   BUN 11 8 - 23 mg/dL   Creatinine, Ser 0.80 0.44 - 1.00 mg/dL   Calcium 8.9 8.9 - 10.3 mg/dL   Total Protein 6.4 (L) 6.5 - 8.1 g/dL   Albumin 3.7 3.5 - 5.0 g/dL   AST 22 15 - 41 U/L   ALT  11 0 - 44 U/L   Alkaline Phosphatase 42 38 - 126 U/L   Total Bilirubin 0.8 0.3 - 1.2 mg/dL   GFR, Estimated >60 >60 mL/min    Comment: (NOTE) Calculated using the CKD-EPI Creatinine Equation (2021)    Anion gap 9 5 - 15    Comment: Performed at [facility] [address] [place], [place] [address]  Resp Panel by RT-PCR (Flu A&B, Covid) Nasopharyngeal Swab     Status: None   Collection Time: 06/26/21  9:13 PM   Specimen: Nasopharyngeal Swab; Nasopharyngeal(NP) swabs in vial transport medium  Result Value Ref Range   SARS Coronavirus 2 by RT PCR NEGATIVE NEGATIVE    Comment: (NOTE) SARS-CoV-2 target nucleic acids are NOT DETECTED.  The SARS-CoV-2 RNA is generally detectable in upper respiratory specimens during the acute phase of infection. The lowest concentration of SARS-CoV-2 viral copies this assay can detect is 138 copies/mL. A negative result does not preclude SARS-Cov-2 infection and should not be used as the sole basis for treatment or other patient management decisions. A negative result may occur with  improper specimen collection/handling, submission of specimen  other than nasopharyngeal swab, presence of viral mutation(s) within the areas targeted by this assay, and inadequate number of viral copies(<138 copies/mL). A negative result must be combined with clinical observations, patient history, and epidemiological information. The expected result is Negative.  Fact Sheet for Patients:  EntrepreneurPulse.com.au  Fact Sheet for Healthcare Providers:  IncredibleEmployment.be  This test is no t yet approved or cleared by the [place] FDA and  has been authorized for detection and/or diagnosis of SARS-CoV-2 by FDA under an Emergency Use Authorization (EUA). This EUA will remain  in effect (meaning this test can be used) for the duration of the COVID-19 declaration under Section 564(b)(1) of the Act, 21 U.S.C.section 360bbb-3(b)(1), unless the authorization is terminated  or revoked sooner.       Influenza A by PCR NEGATIVE NEGATIVE   Influenza B by PCR NEGATIVE NEGATIVE    Comment: (NOTE) The Xpert Xpress SARS-CoV-2/FLU/RSV plus assay is intended as an aid in the diagnosis of influenza from Nasopharyngeal swab specimens and should not be used as a sole basis for treatment. Nasal washings and aspirates are unacceptable for Xpert Xpress SARS-CoV-2/FLU/RSV testing.  Fact Sheet for Patients: EntrepreneurPulse.com.au  Fact Sheet for Healthcare Providers: IncredibleEmployment.be  This test is not yet approved or cleared by the [place] FDA and has been authorized for detection and/or diagnosis of SARS-CoV-2 by FDA under an Emergency Use Authorization (EUA). This EUA will remain in effect (meaning this test can be used) for the duration of the COVID-19 declaration under Section 564(b)(1) of the Act, 21 U.S.C. section 360bbb-3(b)(1), unless the authorization is terminated or revoked.  Performed at [facility], [place] [address] [place], [place] [address]      DG Pelvis 1-2 Views  Result Date: 06/26/2021 CLINICAL DATA:  Fall EXAM: PELVIS - 1-2 VIEW COMPARISON:  None. FINDINGS: No fracture or malalignment. Pubic symphysis and rami appear intact. Mild degenerative changes of both hips. IMPRESSION: No acute osseous abnormality. Electronically Signed   By: Donavan Foil M.D.   On: 06/26/2021 20:44   DG Tibia/Fibula Right  Result Date: 06/26/2021 CLINICAL DATA:  Fall EXAM: RIGHT TIBIA AND FIBULA - 2 VIEW COMPARISON:  None. FINDINGS: Proximal tibia and fibula appear intact. Acute comminuted and displaced distal fibular fracture. Acute displaced posterior malleolar fracture. Posterior dislocation of the talus IMPRESSION: Acute  fracture dislocation at the ankle. Proximal tibia and fibula appear intact. Electronically Signed   By: Donavan Foil M.D.   On: 06/26/2021 20:49   DG Ankle 2 Views Right  Result Date: 06/26/2021 CLINICAL DATA:  Status post reduction and casting EXAM: RIGHT ANKLE - 2 VIEW COMPARISON:  Films from earlier in the same day. FINDINGS: The fracture fragments have been reduced and the posterior talar dislocation has also been reduced. Splinting material is noted. IMPRESSION: Interval reduction of fractures and talar dislocation. Electronically Signed   By: Inez Catalina M.D.   On: 06/26/2021 22:35   DG Ankle 2 Views Right  Result Date: 06/26/2021 CLINICAL DATA:  Fall EXAM: RIGHT ANKLE - 2 VIEW COMPARISON:  None. FINDINGS: Acute comminuted fracture involving distal shaft of the fibula with slightly greater than 1 shaft diameter posterior displacement of distal fracture fragment. There is overriding. Acute displaced posterior malleolar fracture. Posterior dislocation of the talus with respect to the distal tibia. IMPRESSION: Acute comminuted distal tibial and fibular fractures with posterior dislocation of the talus with respect to the distal tibia Electronically Signed   By: Donavan Foil M.D.   On: 06/26/2021 20:46   DG Knee Left Port  Result  Date: 06/26/2021 CLINICAL DATA:  Recent fall with knee pain, initial encounter EXAM: PORTABLE LEFT KNEE - 1-2 VIEW COMPARISON:  None. FINDINGS: There are changes consistent with a transverse fracture through the patella. There is distraction of the fracture fragments of at least 3 cm. Fat fluid effusion is noted between the fracture fragments. Additionally on the frontal film there is fragmentation of the more superior fragment identified. IMPRESSION: Comminuted patellar fracture with associated joint effusion. Electronically Signed   By: Inez Catalina M.D.   On: 06/26/2021 22:01   DG Foot 2 Views Right  Result Date: 06/26/2021 CLINICAL DATA:  Tripping going downstairs EXAM: RIGHT FOOT - 2 VIEW COMPARISON:  Contemporary tibia/fibular radiographs FINDINGS: Supra syndesmotic distal fibular fracture with posterior displacement and angulation and mild comminution. Posterior superiorly displaced fracture of the posterior malleolus. No clear medial malleolar fracture. Lateral talar shift and talar tilting is noted compatible with ankle instability. Large ankle joint effusion. Extensive circumferential soft tissue swelling. Background of mild degenerative changes as well as chronic features suggesting some degree of synostosis of the distal tibiofibular articulation. Bidirectional calcaneal spurring is noted. 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 Associated effusion circumferential ankle swelling. Electronically Signed   By: Lovena Le M.D.   On: 06/26/2021 21:29   DG Femur Min 2 Views Right  Result Date: 06/26/2021 CLINICAL DATA:  Fall EXAM: RIGHT FEMUR 2 VIEWS COMPARISON:  None. FINDINGS: There is no evidence of fracture or other focal bone lesions. Soft tissues are unremarkable. IMPRESSION: Negative. Electronically Signed   By: Donavan Foil M.D.   On: 06/26/2021 20:46     ROS 10 point review of systems is negative except as listed above in HPI.   Physical Exam Blood pressure (!) 106/48, pulse (!) 49, temperature 98.1 F (36.7 C), temperature source Oral, resp. rate 16, SpO2 100 %. Constitutional: well-developed, well-nourished HEENT: pupils equal, round, reactive to light, 35m b/l, moist conjunctiva, external inspection of ears and nose normal, hearing intact Oropharynx: normal oropharyngeal mucosa, normal dentition Neck: no thyromegaly, trachea midline, no midline cervical tenderness to palpation Chest: breath sounds equal bilaterally, normal respiratory effort, no midline or lateral chest  wall tenderness to palpation/deformity Abdomen: soft, NT, no bruising, no hepatosplenomegaly GU: normal female genitalia  Back: no wounds, no thoracic/lumbar spine tenderness to palpation, no thoracic/lumbar spine stepoffs Rectal: deferred Extremities: 2+ radial pulses bilaterally, 2+ L pedal pulse, motor and sensation intact to bilateral UE and LE, no peripheral edema, LLE with KI, RLE splinted MSK: unable to assess gait/station, no clubbing/cyanosis of fingers/toes, normal ROM of BUE, splint and KI as above to BLE limiting ROM Skin: warm, dry, no rashes Psych: normal memory, normal mood/affect    Assessment/Plan: 66F s/p fall down stairs  Recommend CXR as completion of trauma workup. Patient cleared from trauma perspective for admission to orthopedic surgery service. Okay for trauma dosing of lovenox ('30mg'$  BID) and recommend initiation of this as soon as deemed appropriate by primary team. Okay for regular diet from trauma perspective when deemed appropriate by primary team.    Jesusita Oka, MD General and [facility]

## 2021-06-27 NOTE — Progress Notes (Signed)
Consulted by EDP for L patella fx and R ankle fx/dislocation s/p closed reduction in the ED. CT R ankle ordered. Plan to discuss with Haddix/Handy due to complexity. Full consult to follow.

## 2021-06-27 NOTE — Progress Notes (Signed)
MD, please address patient's diet.  Can she get today or not? No diet is entered in her orders. Ty.

## 2021-06-27 NOTE — Consult Note (Signed)
Chief Complaint: Status post fall with bilateral lower extremity injuries History: Courtney Berg is a 65 y.o. female presenting with fall.  Patient states that she was walking down the stairs and tripped and stumbled down about 3 steps.  She states that she landed directly on the left knee and also noticed obvious deformity to the right foot.  Patient states that she did not hit her head and has no chest pain or abdominal pain or other injuries.  Review of systems: No nausea, vomiting, loss of consciousness.  No blurry vision.  No chest pain.  Past Medical History:  Diagnosis Date   Environmental allergies    pt still getting workup [id number]   Miscarriage     No Known Allergies  No current facility-administered medications on file prior to encounter.   Current Outpatient Medications on File Prior to Encounter  Medication Sig Dispense Refill   acetaminophen (TYLENOL) 325 MG tablet Take 650 mg by mouth every 6 (six) hours as needed for moderate pain or headache.     ibuprofen (ADVIL) 200 MG tablet Take 400 mg by mouth every 6 (six) hours as needed for headache or moderate pain.     COVID-19 mRNA vaccine, Pfizer, 30 MCG/0.3ML injection USE AS DIRECTED (Patient not taking: Reported on 06/26/2021) .3 mL 0    Physical Exam: Vitals:   06/27/21 0051 06/27/21 0747  BP: 110/61 (!) 109/48  Pulse: (!) 51 (!) 50  Resp: 18 20  Temp: 98.1 F (36.7 C) 98.3 F (36.8 C)  SpO2: 100% 100%   There is no height or weight on file to calculate BMI. She is alert and oriented x3. Abdomen is soft and nontender.  No loss of bowel bladder control. Lungs are clear to auscultation.  No chest pain or shortness of breath. Full range of motion of the upper extremity with no gross crepitus deformity or pain Lower extremity: Knee immobilizer is in place in the left lower extremity.  Compartments are soft and nontender, 2+ dorsalis pedis/posterior tibialis pulses, EHL/tibialis anterior/gastrocnemius  are intact: Sensation to light touch is intact.  Right lower extremity: No hip or knee pain with palpation or gentle passive range of motion.  Proximal compartments are soft and nontender.  Splint is intact in the right lower extremity.  Capillary refills less than 2 seconds in all digits and she is able to move all digits without significant pain.  There is no pain with passive stretch of the EHL.  Image: DG Pelvis 1-2 Views  Result Date: 06/26/2021 CLINICAL DATA:  Fall EXAM: PELVIS - 1-2 VIEW COMPARISON:  None. FINDINGS: No fracture or malalignment. Pubic symphysis and rami appear intact. Mild degenerative changes of both hips. IMPRESSION: No acute osseous abnormality. Electronically Signed   By: Donavan Foil M.D.   On: 06/26/2021 20:44   DG Tibia/Fibula Right  Result Date: 06/26/2021 CLINICAL DATA:  Fall EXAM: RIGHT TIBIA AND FIBULA - 2 VIEW COMPARISON:  None. FINDINGS: Proximal tibia and fibula appear intact. Acute comminuted and displaced distal fibular fracture. Acute displaced posterior malleolar fracture. Posterior dislocation of the talus IMPRESSION: Acute fracture dislocation at the ankle. Proximal tibia and fibula appear intact. Electronically Signed   By: Donavan Foil M.D.   On: 06/26/2021 20:49   DG Ankle 2 Views Right  Result Date: 06/26/2021 CLINICAL DATA:  Status post reduction and casting EXAM: RIGHT ANKLE - 2 VIEW COMPARISON:  Films from earlier in the same day. FINDINGS: The fracture fragments have been  reduced and the posterior talar dislocation has also been reduced. Splinting material is noted. IMPRESSION: Interval reduction of fractures and talar dislocation. Electronically Signed   By: Inez Catalina M.D.   On: 06/26/2021 22:35   DG Ankle 2 Views Right  Result Date: 06/26/2021 CLINICAL DATA:  Fall EXAM: RIGHT ANKLE - 2 VIEW COMPARISON:  None. FINDINGS: Acute comminuted fracture involving distal shaft of the fibula with slightly greater than 1 shaft diameter posterior  displacement of distal fracture fragment. There is overriding. Acute displaced posterior malleolar fracture. Posterior dislocation of the talus with respect to the distal tibia. IMPRESSION: Acute comminuted distal tibial and fibular fractures with posterior dislocation of the talus with respect to the distal tibia Electronically Signed   By: Donavan Foil M.D.   On: 06/26/2021 20:46   DG Chest Port 1 View  Result Date: 06/27/2021 CLINICAL DATA:  Respiratory failure, preoperative EXAM: PORTABLE CHEST 1 VIEW COMPARISON:  CT 01/24/2018 FINDINGS: Streaky opacities towards the lung bases favoring atelectatic change. No consolidative process or convincing features of edema. The cardiomediastinal contours are unremarkable. No acute osseous or soft tissue abnormality. Degenerative changes are present in the imaged spine and shoulders. Telemetry leads overlie the chest. IMPRESSION: Streaky opacities towards the lung bases, favoring atelectasis. No other acute cardiopulmonary abnormality. Electronically Signed   By: Lovena Le M.D.   On: 06/27/2021 01:34   DG Knee Left Port  Result Date: 06/26/2021 CLINICAL DATA:  Recent fall with knee pain, initial encounter EXAM: PORTABLE LEFT KNEE - 1-2 VIEW COMPARISON:  None. FINDINGS: There are changes consistent with a transverse fracture through the patella. There is distraction of the fracture fragments of at least 3 cm. Fat fluid effusion is noted between the fracture fragments. Additionally on the frontal film there is fragmentation of the more superior fragment identified. IMPRESSION: Comminuted patellar fracture with associated joint effusion. Electronically Signed   By: Inez Catalina M.D.   On: 06/26/2021 22:01   DG Foot 2 Views Right  Result Date: 06/26/2021 CLINICAL DATA:  Tripping going downstairs EXAM: RIGHT FOOT - 2 VIEW COMPARISON:  Contemporary tibia/fibular radiographs FINDINGS: Supra syndesmotic distal fibular fracture with posterior displacement and angulation  and mild comminution. Posterior superiorly displaced fracture of the posterior malleolus. No clear medial malleolar fracture. Lateral talar shift and talar tilting is noted compatible with ankle instability. Large ankle joint effusion. Extensive circumferential soft tissue swelling. Background of mild degenerative changes as well as chronic features suggesting some degree of synostosis of the distal tibiofibular articulation. Bidirectional calcaneal spurring is noted. 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 Associated effusion circumferential ankle swelling. Electronically Signed   By: Lovena Le M.D.   On: 06/26/2021 21:29   DG Femur Min 2 Views Right  Result Date: 06/26/2021 CLINICAL DATA:  Fall EXAM: RIGHT FEMUR 2 VIEWS COMPARISON:  None. FINDINGS: There is no evidence of fracture or other focal bone lesions. Soft tissues are unremarkable. IMPRESSION: Negative. Electronically Signed   By: Donavan Foil M.D.   On: 06/26/2021 20:46    A/P: Ms. Wuensch is a very pleasant 65 year old woman unfortunately suffered a major trauma resulting in a left patella fracture, and right bimalleolar ankle fracture.  Patient currently is in bed with significant lower extremity pain.  Knee immobilizer is intact on the left lower extremity.  Compartments are soft and nontender and there is no evidence of  acute neurological compromise.  Right lower extremity the splint is in adequate position, and the foot does appear to be aligned.  CT scan of the right ankle does demonstrate a trimalleolar ankle fracture with displacement of the posterior malleolus fragment and comminution of the articular surface.  Spoke with Dr. Lyla Glassing.  Plan is to discuss definitive ORIF/fracture management with the Ortho traumatologist.  We will continue bedrest and pain control.  Further recommendations pending.

## 2021-06-27 NOTE — Progress Notes (Signed)
Left a message for Ortho Tech [phone]) to place trapeze for patient.

## 2021-06-28 ENCOUNTER — Encounter (HOSPITAL_COMMUNITY): Payer: Self-pay | Admitting: Internal Medicine

## 2021-06-28 DIAGNOSIS — S9304XA Dislocation of right ankle joint, initial encounter: Secondary | ICD-10-CM | POA: Diagnosis not present

## 2021-06-28 DIAGNOSIS — S82401A Unspecified fracture of shaft of right fibula, initial encounter for closed fracture: Secondary | ICD-10-CM | POA: Diagnosis not present

## 2021-06-28 DIAGNOSIS — S82201A Unspecified fracture of shaft of right tibia, initial encounter for closed fracture: Secondary | ICD-10-CM | POA: Diagnosis not present

## 2021-06-28 DIAGNOSIS — S82002A Unspecified fracture of left patella, initial encounter for closed fracture: Secondary | ICD-10-CM | POA: Diagnosis not present

## 2021-06-28 LAB — BASIC METABOLIC PANEL
Anion gap: 6 (ref 5–15)
BUN: 12 mg/dL (ref 8–23)
CO2: 29 mmol/L (ref 22–32)
Calcium: 8.7 mg/dL — ABNORMAL LOW (ref 8.9–10.3)
Chloride: 98 mmol/L (ref 98–111)
Creatinine, Ser: 0.87 mg/dL (ref 0.44–1.00)
GFR, Estimated: >60 mL/min (ref >60)
Glucose, Bld: 127 mg/dL — ABNORMAL HIGH (ref 70–99)
Potassium: 3.9 mmol/L (ref 3.5–5.1)
Sodium: 133 mmol/L — ABNORMAL LOW (ref 135–145)

## 2021-06-28 LAB — CBC
HCT: 32.6 % — ABNORMAL LOW (ref 36.0–46.0)
Hemoglobin: 10.7 g/dL — ABNORMAL LOW (ref 12.0–15.0)
MCH: 31.2 pg (ref 26.0–34.0)
MCHC: 32.8 g/dL (ref 30.0–36.0)
MCV: 95 fL (ref 80.0–100.0)
Platelets: 266 10*3/uL (ref 150–400)
RBC: 3.43 MIL/uL — ABNORMAL LOW (ref 3.87–5.11)
RDW: 13.4 % (ref 11.5–15.5)
WBC: 7.8 10*3/uL (ref 4.0–10.5)
nRBC: 0 % (ref 0.0–0.2)

## 2021-06-28 LAB — SURGICAL PCR SCREEN
MRSA, PCR: NEGATIVE
Staphylococcus aureus: NEGATIVE

## 2021-06-28 NOTE — Progress Notes (Addendum)
[facility]  PROGRESS NOTE  Courtney Berg [id number] DOB: 11-26-1955 DOA: 06/26/2021 PCP: Susy Frizzle, MD   Brief HPI:   65 year old female with history of allergies who presented to the ED after fall downstairs at work.  She landed on her left knee and also noted to have significant ankle pain and deformity.  Left knee x-ray showed patella fracture.  Right ankle and foot x-ray showed distal tibia and fibula fracture and dislocated ankle.  Ankle was reduced in the ED.  Orthopedics was consulted    Subjective   Patient seen, urinary retention has resolved.  Complains of pain in the right foot.   Assessment/Plan:    Trimalleolar ankle fracture -Orthopedics planning ORIF -Management per orthopedics  Left patella fracture -Left leg in knee immobilizer  Urinary retention -Resolved -Bladder scan obtained yesterday showed 800 cc urine -Likely induced by opioids -Continue in-and-out cath every 6 hours as needed      Scheduled medications:    enoxaparin (LOVENOX) injection  40 mg Subcutaneous Q24H         Data Reviewed:   CBG:  No results for input(s): GLUCAP in the last 168 hours.  SpO2: 95 % O2 Flow Rate (L/min): 15 L/min    Vitals:   06/27/21 0051 06/27/21 0747 06/27/21 2000 06/28/21 0833  BP: 110/61 (!) 109/48 (!) 102/54 (!) 115/57  Pulse: (!) 51 (!) 50 67 76  Resp: '18 20 18 16  '$ Temp: 98.1 F (36.7 C) 98.3 F (36.8 C) 98 F (36.7 C) 98.9 F (37.2 C)  TempSrc: Oral Oral Oral Oral  SpO2: 100% 100% 97% 95%     Intake/Output Summary (Last 24 hours) at 06/28/2021 1109 Last data filed at 06/28/2021 0525 Gross per 24 hour  Intake 270 ml  Output 600 ml  Net -330 ml    08/05 1901 - 08/07 0700 In: 1630 [P.O.:600; I.V.:1030] Out: 1400 [Urine:1400]  There were no vitals filed for this visit.  CBC:  Recent Labs  Lab 06/26/21 1900 06/27/21 0222 06/28/21 0113  WBC 14.6* 10.4 7.8  HGB 12.5 11.5* 10.7*  HCT 38.7 34.7* 32.6*  PLT 306  294 266  MCV 96.0 94.6 95.0  MCH 31.0 31.3 31.2  MCHC 32.3 33.1 32.8  RDW 13.2 13.4 13.4  LYMPHSABS 1.0  --   --   MONOABS 1.1*  --   --   EOSABS 0.0  --   --   BASOSABS 0.1  --   --     Complete metabolic panel:  Recent Labs  Lab 06/26/21 1900 06/27/21 0222 06/28/21 0113  NA 139 138 133*  K 3.5 4.2 3.9  CL 106 104 98  CO2 '24 27 29  '$ GLUCOSE 116* 151* 127*  BUN '11 13 12  '$ CREATININE 0.80 0.82 0.87  CALCIUM 8.9 8.7* 8.7*  AST 22  --   --   ALT 11  --   --   ALKPHOS 42  --   --   BILITOT 0.8  --   --   ALBUMIN 3.7  --   --     No results for input(s): LIPASE, AMYLASE in the last 168 hours.  Recent Labs  Lab 06/26/21 2113  SARSCOV2NAA NEGATIVE    ------------------------------------------------------------------------------------------------------------------ No results for input(s): CHOL, HDL, LDLCALC, TRIG, CHOLHDL, LDLDIRECT in the last 72 hours.  No results found for: HGBA1C ------------------------------------------------------------------------------------------------------------------ No results for input(s): TSH, T4TOTAL, T3FREE, THYROIDAB in the last 72 hours.  Invalid input(s): FREET3 ------------------------------------------------------------------------------------------------------------------ No results for input(s):  VITAMINB12, FOLATE, FERRITIN, TIBC, IRON, RETICCTPCT in the last 72 hours.  Coagulation profile No results for input(s): INR, PROTIME in the last 168 hours. No results for input(s): DDIMER in the last 72 hours.  Cardiac Enzymes No results for input(s): CKTOTAL, CKMB, CKMBINDEX, TROPONINI in the last 168 hours.  ------------------------------------------------------------------------------------------------------------------ No results found for: BNP   Antibiotics: Anti-infectives (From admission, onward)    None        Radiology Reports  DG Pelvis 1-2 Views  Result Date: 06/26/2021 CLINICAL DATA:  Fall EXAM: PELVIS - 1-2  VIEW COMPARISON:  None. FINDINGS: No fracture or malalignment. Pubic symphysis and rami appear intact. Mild degenerative changes of both hips. IMPRESSION: No acute osseous abnormality. Electronically Signed   By: Donavan Foil M.D.   On: 06/26/2021 20:44   DG Tibia/Fibula Right  Result Date: 06/26/2021 CLINICAL DATA:  Fall EXAM: RIGHT TIBIA AND FIBULA - 2 VIEW COMPARISON:  None. FINDINGS: Proximal tibia and fibula appear intact. Acute comminuted and displaced distal fibular fracture. Acute displaced posterior malleolar fracture. Posterior dislocation of the talus IMPRESSION: Acute fracture dislocation at the ankle. Proximal tibia and fibula appear intact. Electronically Signed   By: Donavan Foil M.D.   On: 06/26/2021 20:49   DG Ankle 2 Views Right  Result Date: 06/26/2021 CLINICAL DATA:  Status post reduction and casting EXAM: RIGHT ANKLE - 2 VIEW COMPARISON:  Films from earlier in the same day. FINDINGS: The fracture fragments have been reduced and the posterior talar dislocation has also been reduced. Splinting material is noted. IMPRESSION: Interval reduction of fractures and talar dislocation. Electronically Signed   By: Inez Catalina M.D.   On: 06/26/2021 22:35   DG Ankle 2 Views Right  Result Date: 06/26/2021 CLINICAL DATA:  Fall EXAM: RIGHT ANKLE - 2 VIEW COMPARISON:  None. FINDINGS: Acute comminuted fracture involving distal shaft of the fibula with slightly greater than 1 shaft diameter posterior displacement of distal fracture fragment. There is overriding. Acute displaced posterior malleolar fracture. Posterior dislocation of the talus with respect to the distal tibia. IMPRESSION: Acute comminuted distal tibial and fibular fractures with posterior dislocation of the talus with respect to the distal tibia Electronically Signed   By: Donavan Foil M.D.   On: 06/26/2021 20:46   CT ANKLE RIGHT WO CONTRAST  Result Date: 06/27/2021 CLINICAL DATA:  Complex ankle fractures. EXAM: CT OF THE RIGHT ANKLE  WITHOUT CONTRAST TECHNIQUE: Multidetector CT imaging of the right ankle was performed according to the standard protocol. Multiplanar CT image reconstructions were also generated. COMPARISON:  Radiographs 06/26/2021 FINDINGS: Complex comminuted spiral type fracture involving the distal fibular shaft at and above the level of the ankle mortise. Maximum posterior displacement of 11 mm. Oblique coursing comminuted longitudinal intra-articular fracture involving the posterior malleolus of the tibia. Maximum posterior displacement is 7 mm. Maximum gap at the articular surface is 5 mm. The medial malleolus is intact. The medial mortise is slightly widened and a few tiny bony densities are noted near the talus. Possible deltoid ligament injury. There is a small impaction type fracture involving the medial and anterior aspect of the talar dome. The subtalar joints are maintained. The sinus tarsi is unremarkable. No fracture of the calcaneus or midfoot bony structures. Mild midfoot degenerative changes are noted. Grossly by CT the medial and lateral and anterior ankle tendons are intact. The Achilles tendon is intact. There is a moderate-sized calcaneal heel spur noted. Reading remarkable running IMPRESSION: 1. Complex comminuted spiral type fracture involving the distal  fibular shaft at and above the level of the ankle mortise. 2. Oblique coursing comminuted longitudinal intra-articular fracture involving the posterior malleolus of the tibia. Maximum posterior displacement is 7 mm. Maximum gap at the articular surface is 5 mm. 3. Small impaction type fracture involving the medial and anterior aspect of the talar dome. 4. Slight widening of the medial mortise and a few tiny bony densities near the talus. Possible deltoid ligament injury. Electronically Signed   By: Marijo Sanes M.D.   On: 06/27/2021 09:57   DG Chest Port 1 View  Result Date: 06/27/2021 CLINICAL DATA:  Respiratory failure, preoperative EXAM: PORTABLE  CHEST 1 VIEW COMPARISON:  CT 01/24/2018 FINDINGS: Streaky opacities towards the lung bases favoring atelectatic change. No consolidative process or convincing features of edema. The cardiomediastinal contours are unremarkable. No acute osseous or soft tissue abnormality. Degenerative changes are present in the imaged spine and shoulders. Telemetry leads overlie the chest. IMPRESSION: Streaky opacities towards the lung bases, favoring atelectasis. No other acute cardiopulmonary abnormality. Electronically Signed   By: Lovena Le M.D.   On: 06/27/2021 01:34   DG Knee Left Port  Result Date: 06/26/2021 CLINICAL DATA:  Recent fall with knee pain, initial encounter EXAM: PORTABLE LEFT KNEE - 1-2 VIEW COMPARISON:  None. FINDINGS: There are changes consistent with a transverse fracture through the patella. There is distraction of the fracture fragments of at least 3 cm. Fat fluid effusion is noted between the fracture fragments. Additionally on the frontal film there is fragmentation of the more superior fragment identified. IMPRESSION: Comminuted patellar fracture with associated joint effusion. Electronically Signed   By: Inez Catalina M.D.   On: 06/26/2021 22:01   DG Foot 2 Views Right  Result Date: 06/26/2021 CLINICAL DATA:  Tripping going downstairs EXAM: RIGHT FOOT - 2 VIEW COMPARISON:  Contemporary tibia/fibular radiographs FINDINGS: Supra syndesmotic distal fibular fracture with posterior displacement and angulation and mild comminution. Posterior superiorly displaced fracture of the posterior malleolus. No clear medial malleolar fracture. Lateral talar shift and talar tilting is noted compatible with ankle instability. Large ankle joint effusion. Extensive circumferential soft tissue swelling. Background of mild degenerative changes as well as chronic features suggesting some degree of synostosis of the distal tibiofibular articulation. Bidirectional calcaneal spurring is noted. 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 Associated effusion circumferential ankle swelling. Electronically Signed   By: Lovena Le M.D.   On: 06/26/2021 21:29   DG Femur Min 2 Views Right  Result Date: 06/26/2021 CLINICAL DATA:  Fall EXAM: RIGHT FEMUR 2 VIEWS COMPARISON:  None. FINDINGS: There is no evidence of fracture or other focal bone lesions. Soft tissues are unremarkable. IMPRESSION: Negative. Electronically Signed   By: Donavan Foil M.D.   On: 06/26/2021 20:46      DVT prophylaxis: Lovenox  Code Status: Full code  Family Communication: No family at bedside   Consultants: Orthopedics  Procedures:     Objective    Physical Examination:  General-appears in no acute distress Heart-S1-S2, regular, no murmur auscultated Lungs-clear to auscultation bilaterally, no wheezing or crackles auscultated Abdomen-soft, nontender, no organomegaly Extremities-left lower extremity in knee immobilizer, right foot in splint Neuro-alert, oriented x3, no focal deficit noted  Status is: Inpatient  Dispo: The patient is from: Home              Anticipated d/c is to: Home versus skilled  nursing facility              Anticipated d/c date is: 07/01/2021              Patient currently not stable for discharge  Barrier to discharge-ongoing management for ankle fracture  COVID-19 Labs  No results for input(s): DDIMER, FERRITIN, LDH, CRP in the last 72 hours.  Lab Results  Component Value Date   [place] NEGATIVE 06/26/2021    Microbiology  Recent Results (from the past 240 hour(s))  Resp Panel by RT-PCR (Flu A&B, Covid) Nasopharyngeal Swab     Status: None   Collection Time: 06/26/21  9:13 PM   Specimen: Nasopharyngeal Swab; Nasopharyngeal(NP) swabs in vial transport medium  Result Value Ref Range Status   SARS Coronavirus 2 by RT PCR NEGATIVE NEGATIVE Final     Comment: (NOTE) SARS-CoV-2 target nucleic acids are NOT DETECTED.  The SARS-CoV-2 RNA is generally detectable in upper respiratory specimens during the acute phase of infection. The lowest concentration of SARS-CoV-2 viral copies this assay can detect is 138 copies/mL. A negative result does not preclude SARS-Cov-2 infection and should not be used as the sole basis for treatment or other patient management decisions. A negative result may occur with  improper specimen collection/handling, submission of specimen other than nasopharyngeal swab, presence of viral mutation(s) within the areas targeted by this assay, and inadequate number of viral copies(<138 copies/mL). A negative result must be combined with clinical observations, patient history, and epidemiological information. The expected result is Negative.  Fact Sheet for Patients:  EntrepreneurPulse.com.au  Fact Sheet for Healthcare Providers:  IncredibleEmployment.be  This test is no t yet approved or cleared by the [place] FDA and  has been authorized for detection and/or diagnosis of SARS-CoV-2 by FDA under an Emergency Use Authorization (EUA). This EUA will remain  in effect (meaning this test can be used) for the duration of the COVID-19 declaration under Section 564(b)(1) of the Act, 21 U.S.C.section 360bbb-3(b)(1), unless the authorization is terminated  or revoked sooner.       Influenza A by PCR NEGATIVE NEGATIVE Final   Influenza B by PCR NEGATIVE NEGATIVE Final    Comment: (NOTE) The Xpert Xpress SARS-CoV-2/FLU/RSV plus assay is intended as an aid in the diagnosis of influenza from Nasopharyngeal swab specimens and should not be used as a sole basis for treatment. Nasal washings and aspirates are unacceptable for Xpert Xpress SARS-CoV-2/FLU/RSV testing.  Fact Sheet for Patients: EntrepreneurPulse.com.au  Fact Sheet for Healthcare  Providers: IncredibleEmployment.be  This test is not yet approved or cleared by the [place] FDA and has been authorized for detection and/or diagnosis of SARS-CoV-2 by FDA under an Emergency Use Authorization (EUA). This EUA will remain in effect (meaning this test can be used) for the duration of the COVID-19 declaration under Section 564(b)(1) of the Act, 21 U.S.C. section 360bbb-3(b)(1), unless the authorization is terminated or revoked.  Performed at [facility], [place] [address] [place], [place] [address]   Surgical pcr screen     Status: None   Collection Time: 06/27/21  8:20 PM   Specimen: Nasal Mucosa; Nasal Swab  Result Value Ref Range Status   MRSA, PCR NEGATIVE NEGATIVE Final   Staphylococcus aureus NEGATIVE NEGATIVE Final    Comment: (NOTE) The Xpert SA Assay (FDA approved for NASAL specimens in patients 33 years of age and older), is one component of a comprehensive surveillance program. It is not intended to diagnose infection nor to guide  or monitor treatment. Performed at [facility], [place] [address] [place], [place] [address]          [facility] If 7PM-7AM, please contact night-coverage at [email or web address], Office  [phone]   06/28/2021, 11:09 AM  LOS: 1 day

## 2021-06-28 NOTE — Plan of Care (Signed)
  Problem: Education: Goal: Knowledge of the prescribed therapeutic regimen will improve Outcome: Progressing   Problem: Activity: Goal: Ability to increase mobility will improve Outcome: Progressing   Problem: Physical Regulation: Goal: Postoperative complications will be avoided or minimized Outcome: Progressing   Problem: Pain Management: Goal: Pain level will decrease with appropriate interventions Outcome: Progressing   Problem: Skin Integrity: Goal: Will show signs of wound healing Outcome: Progressing

## 2021-06-28 NOTE — Progress Notes (Signed)
Subjective:  Pt resting comfortably in bed. Anticipating surgery   Objective: Vital signs in last 24 hours: Temp:  [98 F (36.7 C)-98.9 F (37.2 C)] 98.9 F (37.2 C) (08/07 0833) Pulse Rate:  [67-76] 76 (08/07 0833) Resp:  [16-18] 16 (08/07 0833) BP: (102-115)/(54-57) 115/57 (08/07 0833) SpO2:  [95 %-97 %] 95 % (08/07 0833)  Intake/Output from previous day: 08/06 0701 - 08/07 0700 In: 510 [P.O.:480; I.V.:30] Out: 1400 [Urine:1400] Intake/Output this shift: No intake/output data recorded.  Recent Labs    06/26/21 1900 06/27/21 0222 06/28/21 0113  HGB 12.5 11.5* 10.7*   Recent Labs    06/27/21 0222 06/28/21 0113  WBC 10.4 7.8  RBC 3.67* 3.43*  HCT 34.7* 32.6*  PLT 294 266   Recent Labs    06/27/21 0222 06/28/21 0113  NA 138 133*  K 4.2 3.9  CL 104 98  CO2 27 29  BUN 13 12  CREATININE 0.82 0.87  GLUCOSE 151* 127*  CALCIUM 8.7* 8.7*   No results for input(s): LABPT, INR in the last 72 hours.  Splint intact right foot. Able to wiggle toes. Toes pink Knee immobilizer intact left leg   Assessment/Plan: Right distal tib/fib and left patella fracture- Comfortable at this time awaiting surgical repair. Dr Lyla Glassing to contact [facility] for definitive care   Gaynelle Arabian 06/28/2021, 11:02 AM

## 2021-06-29 DIAGNOSIS — S82002A Unspecified fracture of left patella, initial encounter for closed fracture: Secondary | ICD-10-CM | POA: Diagnosis not present

## 2021-06-29 DIAGNOSIS — S82201A Unspecified fracture of shaft of right tibia, initial encounter for closed fracture: Secondary | ICD-10-CM | POA: Diagnosis not present

## 2021-06-29 DIAGNOSIS — S9304XA Dislocation of right ankle joint, initial encounter: Secondary | ICD-10-CM | POA: Diagnosis not present

## 2021-06-29 DIAGNOSIS — S82401A Unspecified fracture of shaft of right fibula, initial encounter for closed fracture: Secondary | ICD-10-CM | POA: Diagnosis not present

## 2021-06-29 NOTE — Plan of Care (Signed)
  Problem: Education: Goal: Knowledge of the prescribed therapeutic regimen will improve Outcome: Progressing   Problem: Activity: Goal: Ability to increase mobility will improve Outcome: Progressing   Problem: Physical Regulation: Goal: Postoperative complications will be avoided or minimized Outcome: Progressing

## 2021-06-29 NOTE — Progress Notes (Signed)
[facility]  PROGRESS NOTE  Courtney Berg [id number] DOB: 01/19/1956 DOA: 06/26/2021 PCP: Susy Frizzle, MD   Brief HPI:   65 year old female with history of allergies who presented to the ED after fall downstairs at work.  She landed on her left knee and also noted to have significant ankle pain and deformity.  Left knee x-ray showed patella fracture.  Right ankle and foot x-ray showed distal tibia and fibula fracture and dislocated ankle.  Ankle was reduced in the ED.  Orthopedics was consulted    Subjective   Patient seen and examined, pain well controlled.  Orthopedics planning to take for surgery on Wednesday.   Assessment/Plan:    Trimalleolar ankle fracture -Orthopedics planning ORIF -Surgery on Wednesday  Left patella fracture -Left leg in knee immobilizer -Surgery on Wednesday  Urinary retention -Resolved -Bladder scan obtained yesterday showed 800 cc urine -Likely induced by opioids -Continue in-and-out cath every 6 hours as needed      Scheduled medications:    enoxaparin (LOVENOX) injection  40 mg Subcutaneous Q24H         Data Reviewed:   CBG:  No results for input(s): GLUCAP in the last 168 hours.  SpO2: 100 % O2 Flow Rate (L/min): 15 L/min    Vitals:   06/28/21 0833 06/28/21 1502 06/28/21 2052 06/29/21 0749  BP: (!) 115/57 (!) 122/50 (!) 123/51 (!) 123/51  Pulse: 76 65 72 65  Resp: '16 16 16 15  '$ Temp: 98.9 F (37.2 C) 98.6 F (37 C) 98.2 F (36.8 C) 98 F (36.7 C)  TempSrc: Oral Oral Oral Oral  SpO2: 95% 99% 100% 100%     Intake/Output Summary (Last 24 hours) at 06/29/2021 1344 Last data filed at 06/29/2021 0900 Gross per 24 hour  Intake 480 ml  Output 2400 ml  Net -1920 ml    08/06 1901 - 08/08 0700 In: 580 [P.O.:580] Out: 3000 [Urine:3000]  There were no vitals filed for this visit.  CBC:  Recent Labs  Lab 06/26/21 1900 06/27/21 0222 06/28/21 0113  WBC 14.6* 10.4 7.8  HGB 12.5 11.5* 10.7*  HCT 38.7  34.7* 32.6*  PLT 306 294 266  MCV 96.0 94.6 95.0  MCH 31.0 31.3 31.2  MCHC 32.3 33.1 32.8  RDW 13.2 13.4 13.4  LYMPHSABS 1.0  --   --   MONOABS 1.1*  --   --   EOSABS 0.0  --   --   BASOSABS 0.1  --   --     Complete metabolic panel:  Recent Labs  Lab 06/26/21 1900 06/27/21 0222 06/28/21 0113  NA 139 138 133*  K 3.5 4.2 3.9  CL 106 104 98  CO2 '24 27 29  '$ GLUCOSE 116* 151* 127*  BUN '11 13 12  '$ CREATININE 0.80 0.82 0.87  CALCIUM 8.9 8.7* 8.7*  AST 22  --   --   ALT 11  --   --   ALKPHOS 42  --   --   BILITOT 0.8  --   --   ALBUMIN 3.7  --   --     No results for input(s): LIPASE, AMYLASE in the last 168 hours.  Recent Labs  Lab 06/26/21 2113  SARSCOV2NAA NEGATIVE    ------------------------------------------------------------------------------------------------------------------ No results for input(s): CHOL, HDL, LDLCALC, TRIG, CHOLHDL, LDLDIRECT in the last 72 hours.  No results found for: HGBA1C ------------------------------------------------------------------------------------------------------------------ No results for input(s): TSH, T4TOTAL, T3FREE, THYROIDAB in the last 72 hours.  Invalid input(s): FREET3 ------------------------------------------------------------------------------------------------------------------ No  results for input(s): VITAMINB12, FOLATE, FERRITIN, TIBC, IRON, RETICCTPCT in the last 72 hours.  Coagulation profile No results for input(s): INR, PROTIME in the last 168 hours. No results for input(s): DDIMER in the last 72 hours.  Cardiac Enzymes No results for input(s): CKTOTAL, CKMB, CKMBINDEX, TROPONINI in the last 168 hours.  ------------------------------------------------------------------------------------------------------------------ No results found for: BNP   Antibiotics: Anti-infectives (From admission, onward)    None        Radiology Reports  No results found.    DVT prophylaxis: Lovenox  Code  Status: Full code  Family Communication: No family at bedside   Consultants: Orthopedics  Procedures:     Objective    Physical Examination:  General-appears in no acute distress Heart-S1-S2, regular, no murmur auscultated Lungs-clear to auscultation bilaterally, no wheezing or crackles auscultated Abdomen-soft, nontender, no organomegaly Extremities-no edema in the lower extremities Neuro-alert, oriented x3, no focal deficit noted  Status is: Inpatient  Dispo: The patient is from: Home              Anticipated d/c is to: Home versus skilled nursing facility              Anticipated d/c date is: 07/03/2021              Patient currently not stable for discharge  Barrier to discharge-ongoing management for ankle fracture  COVID-19 Labs  No results for input(s): DDIMER, FERRITIN, LDH, CRP in the last 72 hours.  Lab Results  Component Value Date   [employer] NEGATIVE 06/26/2021    Microbiology  Recent Results (from the past 240 hour(s))  Resp Panel by RT-PCR (Flu A&B, Covid) Nasopharyngeal Swab     Status: None   Collection Time: 06/26/21  9:13 PM   Specimen: Nasopharyngeal Swab; Nasopharyngeal(NP) swabs in vial transport medium  Result Value Ref Range Status   SARS Coronavirus 2 by RT PCR NEGATIVE NEGATIVE Final    Comment: (NOTE) SARS-CoV-2 target nucleic acids are NOT DETECTED.  The SARS-CoV-2 RNA is generally detectable in upper respiratory specimens during the acute phase of infection. The lowest concentration of SARS-CoV-2 viral copies this assay can detect is 138 copies/mL. A negative result does not preclude SARS-Cov-2 infection and should not be used as the sole basis for treatment or other patient management decisions. A negative result may occur with  improper specimen collection/handling, submission of specimen other than nasopharyngeal swab, presence of viral mutation(s) within the areas targeted by this assay, and inadequate number of  viral copies(<138 copies/mL). A negative result must be combined with clinical observations, patient history, and epidemiological information. The expected result is Negative.  Fact Sheet for Patients:  EntrepreneurPulse.com.au  Fact Sheet for Healthcare Providers:  IncredibleEmployment.be  This test is no t yet approved or cleared by the [place] FDA and  has been authorized for detection and/or diagnosis of SARS-CoV-2 by FDA under an Emergency Use Authorization (EUA). This EUA will remain  in effect (meaning this test can be used) for the duration of the COVID-19 declaration under Section 564(b)(1) of the Act, 21 U.S.C.section 360bbb-3(b)(1), unless the authorization is terminated  or revoked sooner.       Influenza A by PCR NEGATIVE NEGATIVE Final   Influenza B by PCR NEGATIVE NEGATIVE Final    Comment: (NOTE) The Xpert Xpress SARS-CoV-2/FLU/RSV plus assay is intended as an aid in the diagnosis of influenza from Nasopharyngeal swab specimens and should not be used as a sole basis for treatment. Nasal washings and aspirates are  unacceptable for Xpert Xpress SARS-CoV-2/FLU/RSV testing.  Fact Sheet for Patients: EntrepreneurPulse.com.au  Fact Sheet for Healthcare Providers: IncredibleEmployment.be  This test is not yet approved or cleared by the [place] FDA and has been authorized for detection and/or diagnosis of SARS-CoV-2 by FDA under an Emergency Use Authorization (EUA). This EUA will remain in effect (meaning this test can be used) for the duration of the COVID-19 declaration under Section 564(b)(1) of the Act, 21 U.S.C. section 360bbb-3(b)(1), unless the authorization is terminated or revoked.  Performed at [facility], [place] [address] [place], [place] [address]   Surgical pcr screen     Status: None   Collection Time: 06/27/21  8:20 PM   Specimen: Nasal Mucosa; Nasal Swab   Result Value Ref Range Status   MRSA, PCR NEGATIVE NEGATIVE Final   Staphylococcus aureus NEGATIVE NEGATIVE Final    Comment: (NOTE) The Xpert SA Assay (FDA approved for NASAL specimens in patients 36 years of age and older), is one component of a comprehensive surveillance program. It is not intended to diagnose infection nor to guide or monitor treatment. Performed at [facility], [place] [address], [place],  [address]          [facility] If 7PM-7AM, please contact night-coverage at [email or web address], Office  [phone]   06/29/2021, 1:44 PM  LOS: 2 days

## 2021-06-29 NOTE — Consult Note (Signed)
Reason for Consult:Polytrauma Referring Physician: Eleonore Chiquito Time called: 0730 Time at bedside: [place] is an 65 y.o. female.  HPI: Courtney Berg was walking down some steps when she tripped and fell the remaining 3. She's unsure what caused her to trip. She landed on her legs and had immediate pain in her right ankle and left knee. She was brought to the ED where x-rays showed ankle and patella fxs and orthopedic surgery was consulted. She lives at home with her husband and doesn't use any assistive devices to ambulate.  Past Medical History:  Diagnosis Date   Environmental allergies    pt still getting workup [id number]   Miscarriage     Past Surgical History:  Procedure Laterality Date   TONSILLECTOMY     WISDOM TOOTH EXTRACTION      Family History  Problem Relation Age of Onset   Diabetes Mother    Hypertension Father    Cancer Father        stomach cancer @ 15. colon cancer @ 73   Colon cancer Father    Diabetes Brother    Cancer Sister        leukemia    Social History:  reports that she quit smoking about 35 years ago. Her smoking use included cigarettes. She started smoking about 42 years ago. She has a 1.75 pack-year smoking history. She has never used smokeless tobacco. She reports that she does not drink alcohol and does not use drugs.  Allergies: No Known Allergies  Medications: I have reviewed the patient's current medications.  Results for orders placed or performed during the hospital encounter of 06/26/21 (from the past 48 hour(s))  Surgical pcr screen     Status: None   Collection Time: 06/27/21  8:20 PM   Specimen: Nasal Mucosa; Nasal Swab  Result Value Ref Range   MRSA, PCR NEGATIVE NEGATIVE   Staphylococcus aureus NEGATIVE NEGATIVE    Comment: (NOTE) The Xpert SA Assay (FDA approved for NASAL specimens in patients 64 years of age and older), is one component of a comprehensive surveillance program. It is not intended to diagnose  infection nor to guide or monitor treatment. Performed at [facility], [place] [address] [place], [place] [address]   CBC     Status: Abnormal   Collection Time: 06/28/21  1:13 AM  Result Value Ref Range   WBC 7.8 4.0 - 10.5 K/uL   RBC 3.43 (L) 3.87 - 5.11 MIL/uL   Hemoglobin 10.7 (L) 12.0 - 15.0 g/dL   HCT 32.6 (L) 36.0 - 46.0 %   MCV 95.0 80.0 - 100.0 fL   MCH 31.2 26.0 - 34.0 pg   MCHC 32.8 30.0 - 36.0 g/dL   RDW 13.4 11.5 - 15.5 %   Platelets 266 150 - 400 K/uL   nRBC 0.0 0.0 - 0.2 %    Comment: Performed at [facility], [place] [address], [place], [place] [id number]  Basic metabolic panel     Status: Abnormal   Collection Time: 06/28/21  1:13 AM  Result Value Ref Range   Sodium 133 (L) 135 - 145 mmol/L   Potassium 3.9 3.5 - 5.1 mmol/L   Chloride 98 98 - 111 mmol/L   CO2 29 22 - 32 mmol/L   Glucose, Bld 127 (H) 70 - 99 mg/dL    Comment: Glucose reference range applies only to samples taken after fasting for at least 8 hours.   BUN 12 8 -  23 mg/dL   Creatinine, Ser 0.87 0.44 - 1.00 mg/dL   Calcium 8.7 (L) 8.9 - 10.3 mg/dL   GFR, Estimated >60 >60 mL/min    Comment: (NOTE) Calculated using the CKD-EPI Creatinine Equation (2021)    Anion gap 6 5 - 15    Comment: Performed at [facility] [address], [place], [place] [address]    CT ANKLE RIGHT WO CONTRAST  Result Date: 06/27/2021 CLINICAL DATA:  Complex ankle fractures. EXAM: CT OF THE RIGHT ANKLE WITHOUT CONTRAST TECHNIQUE: Multidetector CT imaging of the right ankle was performed according to the standard protocol. Multiplanar CT image reconstructions were also generated. COMPARISON:  Radiographs 06/26/2021 FINDINGS: Complex comminuted spiral type fracture involving the distal fibular shaft at and above the level of the ankle mortise. Maximum posterior displacement of 11 mm. Oblique coursing comminuted longitudinal intra-articular fracture involving the posterior malleolus of the tibia. Maximum posterior  displacement is 7 mm. Maximum gap at the articular surface is 5 mm. The medial malleolus is intact. The medial mortise is slightly widened and a few tiny bony densities are noted near the talus. Possible deltoid ligament injury. There is a small impaction type fracture involving the medial and anterior aspect of the talar dome. The subtalar joints are maintained. The sinus tarsi is unremarkable. No fracture of the calcaneus or midfoot bony structures. Mild midfoot degenerative changes are noted. Grossly by CT the medial and lateral and anterior ankle tendons are intact. The Achilles tendon is intact. There is a moderate-sized calcaneal heel spur noted. Reading remarkable running IMPRESSION: 1. Complex comminuted spiral type fracture involving the distal fibular shaft at and above the level of the ankle mortise. 2. Oblique coursing comminuted longitudinal intra-articular fracture involving the posterior malleolus of the tibia. Maximum posterior displacement is 7 mm. Maximum gap at the articular surface is 5 mm. 3. Small impaction type fracture involving the medial and anterior aspect of the talar dome. 4. Slight widening of the medial mortise and a few tiny bony densities near the talus. Possible deltoid ligament injury. Electronically Signed   By: Marijo Sanes M.D.   On: 06/27/2021 09:57    Review of Systems  HENT:  Negative for ear discharge, ear pain, hearing loss and tinnitus.   Eyes:  Negative for photophobia and pain.  Respiratory:  Negative for cough and shortness of breath.   Cardiovascular:  Negative for chest pain.  Gastrointestinal:  Negative for abdominal pain, nausea and vomiting.  Genitourinary:  Negative for dysuria, flank pain, frequency and urgency.  Musculoskeletal:  Positive for arthralgias (Right ankle, left knee). Negative for back pain, myalgias and neck pain.  Neurological:  Negative for dizziness and headaches.  Hematological:  Does not bruise/bleed easily.   Psychiatric/Behavioral:  The patient is not nervous/anxious.   Blood pressure (!) 123/51, pulse 65, temperature 98 F (36.7 C), temperature source Oral, resp. rate 15, SpO2 100 %. Physical Exam Constitutional:      General: She is not in acute distress.    Appearance: She is well-developed. She is not diaphoretic.  HENT:     Head: Normocephalic and atraumatic.  Eyes:     General: No scleral icterus.       Right eye: No discharge.        Left eye: No discharge.     Conjunctiva/sclera: Conjunctivae normal.  Cardiovascular:     Rate and Rhythm: Normal rate and regular rhythm.  Pulmonary:     Effort: Pulmonary effort is normal. No respiratory  distress.  Musculoskeletal:     Cervical back: Normal range of motion.     Comments: RLE No traumatic wounds, ecchymosis, or rash  Short leg splint in place  No knee effusion  Knee stable to varus/ valgus and anterior/posterior stress  Sens DPN, SPN, TN intact  Motor EHL 5/5  Toes perfused, No significant edema  LLE No traumatic wounds, ecchymosis, or rash  KI in place  No ankle effusion  Sens DPN, SPN, TN intact  Motor EHL, ext, flex, evers 5/5  DP 2+, PT 1+, No significant edema  Skin:    General: Skin is warm and dry.  Neurological:     Mental Status: She is alert.  Psychiatric:        Mood and Affect: Mood normal.        Behavior: Behavior normal.    Assessment/Plan: Right ankle fx -- Will plan ORIF Wednesday by Dr. Doreatha Martin. NPO after MN Tuesday. Left patella fx -- Plan ORIF same time    Lisette Abu, PA-C Orthopedic Surgery [phone] 06/29/2021, 9:12 AM

## 2021-06-29 NOTE — H&P (View-Only) (Signed)
Reason for Consult:Polytrauma Referring Physician: Eleonore Berg Time called: 0730 Time at bedside: Courtney Berg is an 65 y.o. female.  HPI: Courtney Berg was walking down some steps when she tripped and fell the remaining 3. She's unsure what caused her to trip. She landed on her legs and had immediate pain in her right ankle and left knee. She was brought to the ED where x-rays showed ankle and patella fxs and orthopedic surgery was consulted. She lives at home with her husband and doesn't use any assistive devices to ambulate.  Past Medical History:  Diagnosis Date   Environmental allergies    pt still getting workup [id number]   Miscarriage     Past Surgical History:  Procedure Laterality Date   TONSILLECTOMY     WISDOM TOOTH EXTRACTION      Family History  Problem Relation Age of Onset   Diabetes Mother    Hypertension Father    Cancer Father        stomach cancer @ 47. colon cancer @ 22   Colon cancer Father    Diabetes Brother    Cancer Sister        leukemia    Social History:  reports that she quit smoking about 35 years ago. Her smoking use included cigarettes. She started smoking about 42 years ago. She has a 1.75 pack-year smoking history. She has never used smokeless tobacco. She reports that she does not drink alcohol and does not use drugs.  Allergies: No Known Allergies  Medications: I have reviewed the patient's current medications.  Results for orders placed or performed during the hospital encounter of 06/26/21 (from the past 48 hour(s))  Surgical pcr screen     Status: None   Collection Time: 06/27/21  8:20 PM   Specimen: Nasal Mucosa; Nasal Swab  Result Value Ref Range   MRSA, PCR NEGATIVE NEGATIVE   Staphylococcus aureus NEGATIVE NEGATIVE    Comment: (NOTE) The Xpert SA Assay (FDA approved for NASAL specimens in patients 46 years of age and older), is one component of a comprehensive surveillance program. It is not intended to diagnose  infection nor to guide or monitor treatment. Performed at [facility], [place] [address] [place], [place] [address]   CBC     Status: Abnormal   Collection Time: 06/28/21  1:13 AM  Result Value Ref Range   WBC 7.8 4.0 - 10.5 K/uL   RBC 3.43 (L) 3.87 - 5.11 MIL/uL   Hemoglobin 10.7 (L) 12.0 - 15.0 g/dL   HCT 32.6 (L) 36.0 - 46.0 %   MCV 95.0 80.0 - 100.0 fL   MCH 31.2 26.0 - 34.0 pg   MCHC 32.8 30.0 - 36.0 g/dL   RDW 13.4 11.5 - 15.5 %   Platelets 266 150 - 400 K/uL   nRBC 0.0 0.0 - 0.2 %    Comment: Performed at [facility], [place] [address] [place], [place] [id number]  Basic metabolic panel     Status: Abnormal   Collection Time: 06/28/21  1:13 AM  Result Value Ref Range   Sodium 133 (L) 135 - 145 mmol/L   Potassium 3.9 3.5 - 5.1 mmol/L   Chloride 98 98 - 111 mmol/L   CO2 29 22 - 32 mmol/L   Glucose, Bld 127 (H) 70 - 99 mg/dL    Comment: Glucose reference range applies only to samples taken after fasting for at least 8 hours.   BUN 12 8 -  23 mg/dL   Creatinine, Ser 0.87 0.44 - 1.00 mg/dL   Calcium 8.7 (L) 8.9 - 10.3 mg/dL   GFR, Estimated >60 >60 mL/min    Comment: (NOTE) Calculated using the CKD-EPI Creatinine Equation (2021)    Anion gap 6 5 - 15    Comment: Performed at [facility] [address] [place], [place] [address]    CT ANKLE RIGHT WO CONTRAST  Result Date: 06/27/2021 CLINICAL DATA:  Complex ankle fractures. EXAM: CT OF THE RIGHT ANKLE WITHOUT CONTRAST TECHNIQUE: Multidetector CT imaging of the right ankle was performed according to the standard protocol. Multiplanar CT image reconstructions were also generated. COMPARISON:  Radiographs 06/26/2021 FINDINGS: Complex comminuted spiral type fracture involving the distal fibular shaft at and above the level of the ankle mortise. Maximum posterior displacement of 11 mm. Oblique coursing comminuted longitudinal intra-articular fracture involving the posterior malleolus of the tibia. Maximum posterior  displacement is 7 mm. Maximum gap at the articular surface is 5 mm. The medial malleolus is intact. The medial mortise is slightly widened and a few tiny bony densities are noted near the talus. Possible deltoid ligament injury. There is a small impaction type fracture involving the medial and anterior aspect of the talar dome. The subtalar joints are maintained. The sinus tarsi is unremarkable. No fracture of the calcaneus or midfoot bony structures. Mild midfoot degenerative changes are noted. Grossly by CT the medial and lateral and anterior ankle tendons are intact. The Achilles tendon is intact. There is a moderate-sized calcaneal heel spur noted. Reading remarkable running IMPRESSION: 1. Complex comminuted spiral type fracture involving the distal fibular shaft at and above the level of the ankle mortise. 2. Oblique coursing comminuted longitudinal intra-articular fracture involving the posterior malleolus of the tibia. Maximum posterior displacement is 7 mm. Maximum gap at the articular surface is 5 mm. 3. Small impaction type fracture involving the medial and anterior aspect of the talar dome. 4. Slight widening of the medial mortise and a few tiny bony densities near the talus. Possible deltoid ligament injury. Electronically Signed   By: Marijo Sanes M.D.   On: 06/27/2021 09:57    Review of Systems  HENT:  Negative for ear discharge, ear pain, hearing loss and tinnitus.   Eyes:  Negative for photophobia and pain.  Respiratory:  Negative for cough and shortness of breath.   Cardiovascular:  Negative for chest pain.  Gastrointestinal:  Negative for abdominal pain, nausea and vomiting.  Genitourinary:  Negative for dysuria, flank pain, frequency and urgency.  Musculoskeletal:  Positive for arthralgias (Right ankle, left knee). Negative for back pain, myalgias and neck pain.  Neurological:  Negative for dizziness and headaches.  Hematological:  Does not bruise/bleed easily.   Psychiatric/Behavioral:  The patient is not nervous/anxious.   Blood pressure (!) 123/51, pulse 65, temperature 98 F (36.7 C), temperature source Oral, resp. rate 15, SpO2 100 %. Physical Exam Constitutional:      General: She is not in acute distress.    Appearance: She is well-developed. She is not diaphoretic.  HENT:     Head: Normocephalic and atraumatic.  Eyes:     General: No scleral icterus.       Right eye: No discharge.        Left eye: No discharge.     Conjunctiva/sclera: Conjunctivae normal.  Cardiovascular:     Rate and Rhythm: Normal rate and regular rhythm.  Pulmonary:     Effort: Pulmonary effort is normal. No respiratory  distress.  Musculoskeletal:     Cervical back: Normal range of motion.     Comments: RLE No traumatic wounds, ecchymosis, or rash  Short leg splint in place  No knee effusion  Knee stable to varus/ valgus and anterior/posterior stress  Sens DPN, SPN, TN intact  Motor EHL 5/5  Toes perfused, No significant edema  LLE No traumatic wounds, ecchymosis, or rash  KI in place  No ankle effusion  Sens DPN, SPN, TN intact  Motor EHL, ext, flex, evers 5/5  DP 2+, PT 1+, No significant edema  Skin:    General: Skin is warm and dry.  Neurological:     Mental Status: She is alert.  Psychiatric:        Mood and Affect: Mood normal.        Behavior: Behavior normal.    Assessment/Plan: Right ankle fx -- Will plan ORIF Wednesday by Dr. Doreatha Martin. NPO after MN Tuesday. Left patella fx -- Plan ORIF same time    Lisette Abu, PA-C Orthopedic Surgery [phone] 06/29/2021, 9:12 AM

## 2021-06-29 NOTE — TOC CAGE-AID Note (Signed)
Transition of Care [facility]) - CAGE-AID Screening   Patient Details  Name: Courtney Berg MRN: [id number] Date of Birth: 29-Mar-1956    Elvina Sidle, RN Trauma Response Nurse Phone Number: [phone] 06/29/2021, 6:49 PM  CAGE-AID Screening:       Substance Abuse Education Offered: No (denies and drug or alcohol use)

## 2021-06-30 DIAGNOSIS — S82002A Unspecified fracture of left patella, initial encounter for closed fracture: Secondary | ICD-10-CM | POA: Diagnosis not present

## 2021-06-30 DIAGNOSIS — S9304XA Dislocation of right ankle joint, initial encounter: Secondary | ICD-10-CM | POA: Diagnosis not present

## 2021-06-30 DIAGNOSIS — S82201A Unspecified fracture of shaft of right tibia, initial encounter for closed fracture: Secondary | ICD-10-CM | POA: Diagnosis not present

## 2021-06-30 DIAGNOSIS — S82401A Unspecified fracture of shaft of right fibula, initial encounter for closed fracture: Secondary | ICD-10-CM | POA: Diagnosis not present

## 2021-06-30 LAB — CBC
HCT: 34.3 % — ABNORMAL LOW (ref 36.0–46.0)
Hemoglobin: 11.3 g/dL — ABNORMAL LOW (ref 12.0–15.0)
MCH: 31.2 pg (ref 26.0–34.0)
MCHC: 32.9 g/dL (ref 30.0–36.0)
MCV: 94.8 fL (ref 80.0–100.0)
Platelets: 303 10*3/uL (ref 150–400)
RBC: 3.62 MIL/uL — ABNORMAL LOW (ref 3.87–5.11)
RDW: 13.2 % (ref 11.5–15.5)
WBC: 8.9 10*3/uL (ref 4.0–10.5)
nRBC: 0 % (ref 0.0–0.2)

## 2021-06-30 NOTE — Progress Notes (Signed)
OT Cancellation Note  Patient Details Name: Courtney Berg MRN: [id number] DOB: February 19, 1956   Cancelled Treatment:    Reason Eval/Treat Not Completed: Medical issues which prohibited therapy;Patient not medically ready (Pt with plans for bilat LE ORIF of L patella and R ankle tomorrow 8/10; OT evaluation to be completed after fixation & weightbearing/ROM orders placed)  Mitsue Peery A Eldonna Neuenfeldt 06/30/2021, 9:21 AM

## 2021-06-30 NOTE — Progress Notes (Signed)
PT Cancellation Note  Patient Details Name: Courtney Berg MRN: [id number] DOB: 05/06/1956   Cancelled Treatment:    Reason Eval/Treat Not Completed: Medical issues which prohibited therapy.  Pt with plans for ORIF of L patella and R ankle tomorrow 8/10; PT evaluation to be completed after fixation & weightbearing/ROM orders placed.   Ramond Dial 06/30/2021, 9:36 AM  Mee Hives, PT MS Acute Rehab Dept. Number: [place]

## 2021-06-30 NOTE — Progress Notes (Signed)
[facility]  PROGRESS NOTE  Courtney Berg [id number] DOB: 1956/01/23 DOA: 06/26/2021 PCP: Susy Frizzle, MD   Brief HPI:   65 year old female with history of allergies who presented to the ED after fall downstairs at work.  She landed on her left knee and also noted to have significant ankle pain and deformity.  Left knee x-ray showed patella fracture.  Right ankle and foot x-ray showed distal tibia and fibula fracture and dislocated ankle.  Ankle was reduced in the ED.  Orthopedics was consulted    Subjective   Patient seen and examined, denies any new complaints.  Plan for surgery in a.m.   Assessment/Plan:    Trimalleolar ankle fracture -Orthopedics planning ORIF -Surgery on Wednesday  Left patella fracture -Left leg in knee immobilizer -Surgery on Wednesday  Urinary retention -Resolved -Bladder scan obtained yesterday showed 800 cc urine -Likely induced by opioids -Continue in-and-out cath every 6 hours as needed      Scheduled medications:    enoxaparin (LOVENOX) injection  40 mg Subcutaneous Q24H         Data Reviewed:   CBG:  No results for input(s): GLUCAP in the last 168 hours.  SpO2: 100 % O2 Flow Rate (L/min): 15 L/min    Vitals:   06/29/21 1415 06/29/21 2059 06/30/21 0510 06/30/21 0754  BP: 134/62 122/70 (!) 127/59 138/60  Pulse: 74 87 82 84  Resp:  17 18   Temp: 98.7 F (37.1 C) 98.4 F (36.9 C) 98.1 F (36.7 C) (!) 97.4 F (36.3 C)  TempSrc: Oral Oral Oral Oral  SpO2: 100% 97% 99% 100%     Intake/Output Summary (Last 24 hours) at 06/30/2021 1431 Last data filed at 06/30/2021 0900 Gross per 24 hour  Intake 240 ml  Output 900 ml  Net -660 ml    08/07 1901 - 08/09 0700 In: 720 [P.O.:720] Out: 1800 [Urine:1800]  There were no vitals filed for this visit.  CBC:  Recent Labs  Lab 06/26/21 1900 06/27/21 0222 06/28/21 0113 06/30/21 0305  WBC 14.6* 10.4 7.8 8.9  HGB 12.5 11.5* 10.7* 11.3*  HCT 38.7 34.7*  32.6* 34.3*  PLT 306 294 266 303  MCV 96.0 94.6 95.0 94.8  MCH 31.0 31.3 31.2 31.2  MCHC 32.3 33.1 32.8 32.9  RDW 13.2 13.4 13.4 13.2  LYMPHSABS 1.0  --   --   --   MONOABS 1.1*  --   --   --   EOSABS 0.0  --   --   --   BASOSABS 0.1  --   --   --     Complete metabolic panel:  Recent Labs  Lab 06/26/21 1900 06/27/21 0222 06/28/21 0113  NA 139 138 133*  K 3.5 4.2 3.9  CL 106 104 98  CO2 '24 27 29  '$ GLUCOSE 116* 151* 127*  BUN '11 13 12  '$ CREATININE 0.80 0.82 0.87  CALCIUM 8.9 8.7* 8.7*  AST 22  --   --   ALT 11  --   --   ALKPHOS 42  --   --   BILITOT 0.8  --   --   ALBUMIN 3.7  --   --     No results for input(s): LIPASE, AMYLASE in the last 168 hours.  Recent Labs  Lab 06/26/21 2113  SARSCOV2NAA NEGATIVE    ------------------------------------------------------------------------------------------------------------------ No results for input(s): CHOL, HDL, LDLCALC, TRIG, CHOLHDL, LDLDIRECT in the last 72 hours.  No results found for: HGBA1C ------------------------------------------------------------------------------------------------------------------ No  results for input(s): TSH, T4TOTAL, T3FREE, THYROIDAB in the last 72 hours.  Invalid input(s): FREET3 ------------------------------------------------------------------------------------------------------------------ No results for input(s): VITAMINB12, FOLATE, FERRITIN, TIBC, IRON, RETICCTPCT in the last 72 hours.  Coagulation profile No results for input(s): INR, PROTIME in the last 168 hours. No results for input(s): DDIMER in the last 72 hours.  Cardiac Enzymes No results for input(s): CKTOTAL, CKMB, CKMBINDEX, TROPONINI in the last 168 hours.  ------------------------------------------------------------------------------------------------------------------ No results found for: BNP   Antibiotics: Anti-infectives (From admission, onward)    None        Radiology Reports  No results  found.    DVT prophylaxis: Lovenox  Code Status: Full code  Family Communication: No family at bedside   Consultants: Orthopedics  Procedures:     Objective    Physical Examination:  General-appears in no acute distress Heart-S1-S2, regular, no murmur auscultated Lungs-clear to auscultation bilaterally, no wheezing or crackles auscultated Abdomen-soft, nontender, no organomegaly Extremities-no edema in the lower extremities Neuro-alert, oriented x3, no focal deficit noted  Status is: Inpatient  Dispo: The patient is from: Home              Anticipated d/c is to: Home versus skilled nursing facility              Anticipated d/c date is: 07/03/2021              Patient currently not stable for discharge  Barrier to discharge-ongoing management for ankle fracture, left patella fracture  COVID-19 Labs  No results for input(s): DDIMER, FERRITIN, LDH, CRP in the last 72 hours.  Lab Results  Component Value Date   [place] NEGATIVE 06/26/2021    Microbiology  Recent Results (from the past 240 hour(s))  Resp Panel by RT-PCR (Flu A&B, Covid) Nasopharyngeal Swab     Status: None   Collection Time: 06/26/21  9:13 PM   Specimen: Nasopharyngeal Swab; Nasopharyngeal(NP) swabs in vial transport medium  Result Value Ref Range Status   SARS Coronavirus 2 by RT PCR NEGATIVE NEGATIVE Final    Comment: (NOTE) SARS-CoV-2 target nucleic acids are NOT DETECTED.  The SARS-CoV-2 RNA is generally detectable in upper respiratory specimens during the acute phase of infection. The lowest concentration of SARS-CoV-2 viral copies this assay can detect is 138 copies/mL. A negative result does not preclude SARS-Cov-2 infection and should not be used as the sole basis for treatment or other patient management decisions. A negative result may occur with  improper specimen collection/handling, submission of specimen other than nasopharyngeal swab, presence of viral mutation(s) within  the areas targeted by this assay, and inadequate number of viral copies(<138 copies/mL). A negative result must be combined with clinical observations, patient history, and epidemiological information. The expected result is Negative.  Fact Sheet for Patients:  EntrepreneurPulse.com.au  Fact Sheet for Healthcare Providers:  IncredibleEmployment.be  This test is no t yet approved or cleared by the [place] FDA and  has been authorized for detection and/or diagnosis of SARS-CoV-2 by FDA under an Emergency Use Authorization (EUA). This EUA will remain  in effect (meaning this test can be used) for the duration of the COVID-19 declaration under Section 564(b)(1) of the Act, 21 U.S.C.section 360bbb-3(b)(1), unless the authorization is terminated  or revoked sooner.       Influenza A by PCR NEGATIVE NEGATIVE Final   Influenza B by PCR NEGATIVE NEGATIVE Final    Comment: (NOTE) The Xpert Xpress SARS-CoV-2/FLU/RSV plus assay is intended as an aid in the diagnosis of  influenza from Nasopharyngeal swab specimens and should not be used as a sole basis for treatment. Nasal washings and aspirates are unacceptable for Xpert Xpress SARS-CoV-2/FLU/RSV testing.  Fact Sheet for Patients: EntrepreneurPulse.com.au  Fact Sheet for Healthcare Providers: IncredibleEmployment.be  This test is not yet approved or cleared by the [place] FDA and has been authorized for detection and/or diagnosis of SARS-CoV-2 by FDA under an Emergency Use Authorization (EUA). This EUA will remain in effect (meaning this test can be used) for the duration of the COVID-19 declaration under Section 564(b)(1) of the Act, 21 U.S.C. section 360bbb-3(b)(1), unless the authorization is terminated or revoked.  Performed at [facility], [place] [address], [place], [place] [address]   Surgical pcr screen     Status: None   Collection  Time: 06/27/21  8:20 PM   Specimen: Nasal Mucosa; Nasal Swab  Result Value Ref Range Status   MRSA, PCR NEGATIVE NEGATIVE Final   Staphylococcus aureus NEGATIVE NEGATIVE Final    Comment: (NOTE) The Xpert SA Assay (FDA approved for NASAL specimens in patients 33 years of age and older), is one component of a comprehensive surveillance program. It is not intended to diagnose infection nor to guide or monitor treatment. Performed at [facility], [place] [address], [place], [place] [address]          [facility] If 7PM-7AM, please contact night-coverage at [email or web address], Office  [phone]   06/30/2021, 2:31 PM  LOS: 3 days

## 2021-06-30 NOTE — Plan of Care (Signed)
  Problem: Education: Goal: Knowledge of the prescribed therapeutic regimen will improve Outcome: Progressing   Problem: Pain Management: Goal: Pain level will decrease with appropriate interventions Outcome: Progressing   Problem: Skin Integrity: Goal: Will show signs of wound healing Outcome: Progressing   

## 2021-07-01 ENCOUNTER — Encounter (HOSPITAL_COMMUNITY)
Admission: EM | Disposition: A | Discharge status: Rehabilitation Facility | Payer: Self-pay | Source: Home / Self Care | Attending: Internal Medicine

## 2021-07-01 ENCOUNTER — Inpatient Hospital Stay (HOSPITAL_COMMUNITY): Payer: Worker's Compensation | Admitting: Certified Registered Nurse Anesthetist

## 2021-07-01 ENCOUNTER — Inpatient Hospital Stay (HOSPITAL_COMMUNITY): Payer: Worker's Compensation

## 2021-07-01 DIAGNOSIS — D649 Anemia, unspecified: Secondary | ICD-10-CM

## 2021-07-01 DIAGNOSIS — S82201A Unspecified fracture of shaft of right tibia, initial encounter for closed fracture: Secondary | ICD-10-CM | POA: Diagnosis not present

## 2021-07-01 DIAGNOSIS — T7840XA Allergy, unspecified, initial encounter: Secondary | ICD-10-CM | POA: Diagnosis not present

## 2021-07-01 DIAGNOSIS — S82002A Unspecified fracture of left patella, initial encounter for closed fracture: Secondary | ICD-10-CM | POA: Diagnosis not present

## 2021-07-01 HISTORY — PX: ORIF PATELLA: SHX5033

## 2021-07-01 HISTORY — PX: ORIF ANKLE FRACTURE: SHX5408

## 2021-07-01 LAB — COMPREHENSIVE METABOLIC PANEL
ALT: 20 U/L (ref 0–44)
AST: 22 U/L (ref 15–41)
Albumin: 3.5 g/dL (ref 3.5–5.0)
Alkaline Phosphatase: 40 U/L (ref 38–126)
Anion gap: 8 (ref 5–15)
BUN: 15 mg/dL (ref 8–23)
CO2: 28 mmol/L (ref 22–32)
Calcium: 9.1 mg/dL (ref 8.9–10.3)
Chloride: 102 mmol/L (ref 98–111)
Creatinine, Ser: 0.72 mg/dL (ref 0.44–1.00)
GFR, Estimated: >60 mL/min (ref >60)
Glucose, Bld: 113 mg/dL — ABNORMAL HIGH (ref 70–99)
Potassium: 4.3 mmol/L (ref 3.5–5.1)
Sodium: 138 mmol/L (ref 135–145)
Total Bilirubin: 0.5 mg/dL (ref 0.3–1.2)
Total Protein: 6.7 g/dL (ref 6.5–8.1)

## 2021-07-01 LAB — CBC WITH DIFFERENTIAL/PLATELET
Abs Immature Granulocytes: 0.03 10*3/uL (ref 0.00–0.07)
Basophils Absolute: 0.1 10*3/uL (ref 0.0–0.1)
Basophils Relative: 1 %
Eosinophils Absolute: 0.2 10*3/uL (ref 0.0–0.5)
Eosinophils Relative: 3 %
HCT: 36.9 % (ref 36.0–46.0)
Hemoglobin: 12 g/dL (ref 12.0–15.0)
Immature Granulocytes: 0 %
Lymphocytes Relative: 20 %
Lymphs Abs: 1.7 10*3/uL (ref 0.7–4.0)
MCH: 30.5 pg (ref 26.0–34.0)
MCHC: 32.5 g/dL (ref 30.0–36.0)
MCV: 93.9 fL (ref 80.0–100.0)
Monocytes Absolute: 1.1 10*3/uL — ABNORMAL HIGH (ref 0.1–1.0)
Monocytes Relative: 12 %
Neutro Abs: 5.8 10*3/uL (ref 1.7–7.7)
Neutrophils Relative %: 64 %
Platelets: 337 10*3/uL (ref 150–400)
RBC: 3.93 MIL/uL (ref 3.87–5.11)
RDW: 13.2 % (ref 11.5–15.5)
WBC: 8.9 10*3/uL (ref 4.0–10.5)
nRBC: 0 % (ref 0.0–0.2)

## 2021-07-01 LAB — MAGNESIUM: Magnesium: 1.9 mg/dL (ref 1.7–2.4)

## 2021-07-01 LAB — VITAMIN D 25 HYDROXY (VIT D DEFICIENCY, FRACTURES): Vit D, 25-Hydroxy: 19.11 ng/mL — ABNORMAL LOW (ref 30–100)

## 2021-07-01 LAB — PHOSPHORUS: Phosphorus: 3.9 mg/dL (ref 2.5–4.6)

## 2021-07-01 SURGERY — OPEN REDUCTION INTERNAL FIXATION (ORIF) ANKLE FRACTURE
Anesthesia: General | Site: Knee | Laterality: Right

## 2021-07-01 MED ORDER — LIDOCAINE 2% (20 MG/ML) 5 ML SYRINGE
INTRAMUSCULAR | Status: DC | PRN
  Administered 2021-07-01: 80 mg via INTRAVENOUS

## 2021-07-01 MED ORDER — MIDAZOLAM HCL 2 MG/2ML IJ SOLN
INTRAMUSCULAR | Status: AC
  Administered 2021-07-01: 1 mg via INTRAVENOUS
  Filled 2021-07-01: qty 2

## 2021-07-01 MED ORDER — POTASSIUM CHLORIDE IN NACL 20-0.9 MEQ/L-% IV SOLN
INTRAVENOUS | Status: DC
  Filled 2021-07-01 (×3): qty 1000

## 2021-07-01 MED ORDER — LACTATED RINGERS IV SOLN: INTRAVENOUS | Status: DC

## 2021-07-01 MED ORDER — METHOCARBAMOL 1000 MG/10ML IJ SOLN
500.0000 mg | Freq: Four times a day (QID) | INTRAVENOUS | Status: DC | PRN
  Filled 2021-07-01: qty 5

## 2021-07-01 MED ORDER — PROPOFOL 10 MG/ML IV BOLUS
INTRAVENOUS | Status: AC
  Filled 2021-07-01: qty 20

## 2021-07-01 MED ORDER — 0.9 % SODIUM CHLORIDE (POUR BTL) OPTIME
TOPICAL | Status: DC | PRN
  Administered 2021-07-01: 1000 mL

## 2021-07-01 MED ORDER — FENTANYL CITRATE (PF) 100 MCG/2ML IJ SOLN: 100.0000 ug | Freq: Once | INTRAMUSCULAR | Status: AC

## 2021-07-01 MED ORDER — FENTANYL CITRATE (PF) 100 MCG/2ML IJ SOLN
INTRAMUSCULAR | Status: AC
  Administered 2021-07-01: 100 ug via INTRAVENOUS
  Filled 2021-07-01: qty 2

## 2021-07-01 MED ORDER — SCOPOLAMINE 1 MG/3DAYS TD PT72
1.0000 | MEDICATED_PATCH | TRANSDERMAL | Status: DC
  Administered 2021-07-01: 1.5 mg via TRANSDERMAL
  Filled 2021-07-01: qty 1

## 2021-07-01 MED ORDER — ONDANSETRON HCL 4 MG PO TABS: 4.0000 mg | ORAL_TABLET | Freq: Four times a day (QID) | ORAL | Status: DC | PRN

## 2021-07-01 MED ORDER — METOCLOPRAMIDE HCL 5 MG PO TABS
5.0000 mg | ORAL_TABLET | Freq: Three times a day (TID) | ORAL | Status: DC | PRN
Start: 2021-07-01 — End: 2021-07-13

## 2021-07-01 MED ORDER — ROCURONIUM BROMIDE 10 MG/ML (PF) SYRINGE
PREFILLED_SYRINGE | INTRAVENOUS | Status: DC | PRN
Start: 2021-07-01 — End: 2021-07-01
  Administered 2021-07-01: 50 mg via INTRAVENOUS

## 2021-07-01 MED ORDER — HYDROMORPHONE HCL 1 MG/ML IJ SOLN: 0.5000 mg | INTRAMUSCULAR | Status: DC | PRN

## 2021-07-01 MED ORDER — VANCOMYCIN HCL 1000 MG IV SOLR
INTRAVENOUS | Status: AC
  Filled 2021-07-01: qty 2000

## 2021-07-01 MED ORDER — ORAL CARE MOUTH RINSE: 15.0000 mL | Freq: Once | OROMUCOSAL | Status: AC

## 2021-07-01 MED ORDER — DEXAMETHASONE SODIUM PHOSPHATE 4 MG/ML IJ SOLN
INTRAMUSCULAR | Status: DC | PRN
  Administered 2021-07-01: 8 mg via PERINEURAL

## 2021-07-01 MED ORDER — ONDANSETRON HCL 4 MG/2ML IJ SOLN
INTRAMUSCULAR | Status: DC | PRN
  Administered 2021-07-01: 4 mg via INTRAVENOUS

## 2021-07-01 MED ORDER — MIDAZOLAM HCL 2 MG/2ML IJ SOLN
INTRAMUSCULAR | Status: AC
  Filled 2021-07-01: qty 2

## 2021-07-01 MED ORDER — DEXAMETHASONE SODIUM PHOSPHATE 10 MG/ML IJ SOLN
INTRAMUSCULAR | Status: DC | PRN
  Administered 2021-07-01: 10 mg via INTRAVENOUS

## 2021-07-01 MED ORDER — VANCOMYCIN HCL 1000 MG IV SOLR
INTRAVENOUS | Status: DC | PRN
  Administered 2021-07-01 (×2): 1000 mg

## 2021-07-01 MED ORDER — FENTANYL CITRATE (PF) 250 MCG/5ML IJ SOLN
INTRAMUSCULAR | Status: AC
  Filled 2021-07-01: qty 5

## 2021-07-01 MED ORDER — HYDROCODONE-ACETAMINOPHEN 5-325 MG PO TABS
1.0000 | ORAL_TABLET | ORAL | Status: DC | PRN
  Administered 2021-07-01 – 2021-07-08 (×11): 2 via ORAL
  Filled 2021-07-01 (×12): qty 2

## 2021-07-01 MED ORDER — ALBUMIN HUMAN 5 % IV SOLN: INTRAVENOUS | Status: DC | PRN

## 2021-07-01 MED ORDER — FENTANYL CITRATE (PF) 100 MCG/2ML IJ SOLN
25.0000 ug | INTRAMUSCULAR | Status: DC | PRN
  Administered 2021-07-01: 25 ug via INTRAVENOUS

## 2021-07-01 MED ORDER — SUGAMMADEX SODIUM 200 MG/2ML IV SOLN
INTRAVENOUS | Status: DC | PRN
  Administered 2021-07-01: 200 mg via INTRAVENOUS

## 2021-07-01 MED ORDER — HYDROCODONE-ACETAMINOPHEN 7.5-325 MG PO TABS
1.0000 | ORAL_TABLET | ORAL | Status: DC | PRN
  Administered 2021-07-01 – 2021-07-03 (×3): 2 via ORAL
  Filled 2021-07-01 (×3): qty 2

## 2021-07-01 MED ORDER — FENTANYL CITRATE (PF) 250 MCG/5ML IJ SOLN
INTRAMUSCULAR | Status: DC | PRN
  Administered 2021-07-01 (×3): 50 ug via INTRAVENOUS

## 2021-07-01 MED ORDER — MEPERIDINE HCL 25 MG/ML IJ SOLN: 6.2500 mg | INTRAMUSCULAR | Status: DC | PRN

## 2021-07-01 MED ORDER — PROPOFOL 10 MG/ML IV BOLUS
INTRAVENOUS | Status: DC | PRN
  Administered 2021-07-01: 140 mg via INTRAVENOUS

## 2021-07-01 MED ORDER — METHOCARBAMOL 500 MG PO TABS
500.0000 mg | ORAL_TABLET | Freq: Four times a day (QID) | ORAL | Status: DC | PRN
  Administered 2021-07-02 – 2021-07-03 (×3): 500 mg via ORAL
  Filled 2021-07-01 (×4): qty 1

## 2021-07-01 MED ORDER — CHLORHEXIDINE GLUCONATE 0.12 % MT SOLN
OROMUCOSAL | Status: AC
  Administered 2021-07-01: 15 mL via OROMUCOSAL
  Filled 2021-07-01: qty 15

## 2021-07-01 MED ORDER — EPHEDRINE SULFATE 50 MG/ML IJ SOLN
INTRAMUSCULAR | Status: DC | PRN
  Administered 2021-07-01: 5 mg via INTRAVENOUS

## 2021-07-01 MED ORDER — METOCLOPRAMIDE HCL 5 MG/ML IJ SOLN: 5.0000 mg | Freq: Three times a day (TID) | INTRAMUSCULAR | Status: DC | PRN

## 2021-07-01 MED ORDER — MIDAZOLAM HCL 2 MG/2ML IJ SOLN: 1.0000 mg | Freq: Once | INTRAMUSCULAR | Status: AC

## 2021-07-01 MED ORDER — CHLORHEXIDINE GLUCONATE 4 % EX LIQD: 60.0000 mL | Freq: Once | CUTANEOUS | Status: DC

## 2021-07-01 MED ORDER — CLONIDINE HCL (ANALGESIA) 100 MCG/ML EP SOLN
EPIDURAL | Status: DC | PRN
  Administered 2021-07-01: 100 ug

## 2021-07-01 MED ORDER — CEFAZOLIN SODIUM-DEXTROSE 2-4 GM/100ML-% IV SOLN
2.0000 g | Freq: Three times a day (TID) | INTRAVENOUS | Status: AC
Start: 2021-07-01 — End: 2021-07-02
  Administered 2021-07-01 – 2021-07-02 (×3): 2 g via INTRAVENOUS
  Filled 2021-07-01 (×3): qty 100

## 2021-07-01 MED ORDER — ENOXAPARIN SODIUM 40 MG/0.4ML IJ SOSY
40.0000 mg | PREFILLED_SYRINGE | INTRAMUSCULAR | Status: DC
Start: 2021-07-02 — End: 2021-07-03
  Administered 2021-07-02: 40 mg via SUBCUTANEOUS
  Filled 2021-07-01: qty 0.4

## 2021-07-01 MED ORDER — PROMETHAZINE HCL 25 MG/ML IJ SOLN: 6.2500 mg | INTRAMUSCULAR | Status: DC | PRN

## 2021-07-01 MED ORDER — ONDANSETRON HCL 4 MG/2ML IJ SOLN: 4.0000 mg | Freq: Four times a day (QID) | INTRAMUSCULAR | Status: DC | PRN

## 2021-07-01 MED ORDER — CHLORHEXIDINE GLUCONATE 0.12 % MT SOLN: 15.0000 mL | Freq: Once | OROMUCOSAL | Status: AC

## 2021-07-01 MED ORDER — BUPIVACAINE-EPINEPHRINE (PF) 0.5% -1:200000 IJ SOLN
INTRAMUSCULAR | Status: DC | PRN
  Administered 2021-07-01: 15 mL via PERINEURAL
  Administered 2021-07-01: 10 mL via PERINEURAL
  Administered 2021-07-01: 25 mL via PERINEURAL

## 2021-07-01 MED ORDER — POVIDONE-IODINE 10 % EX SWAB
2.0000 "application " | Freq: Once | CUTANEOUS | Status: AC
  Administered 2021-07-01: 2 via TOPICAL

## 2021-07-01 MED ORDER — MIDAZOLAM HCL 2 MG/2ML IJ SOLN
INTRAMUSCULAR | Status: DC | PRN
  Administered 2021-07-01: 2 mg via INTRAVENOUS

## 2021-07-01 MED ORDER — DOCUSATE SODIUM 100 MG PO CAPS
100.0000 mg | ORAL_CAPSULE | Freq: Two times a day (BID) | ORAL | Status: DC
  Administered 2021-07-01 – 2021-07-05 (×8): 100 mg via ORAL
  Filled 2021-07-01 (×8): qty 1

## 2021-07-01 MED ORDER — PHENYLEPHRINE HCL-NACL 20-0.9 MG/250ML-% IV SOLN
INTRAVENOUS | Status: DC | PRN
  Administered 2021-07-01: 25 ug/min via INTRAVENOUS

## 2021-07-01 MED ORDER — FENTANYL CITRATE (PF) 100 MCG/2ML IJ SOLN
INTRAMUSCULAR | Status: AC
  Filled 2021-07-01: qty 2

## 2021-07-01 MED ORDER — CEFAZOLIN SODIUM-DEXTROSE 2-4 GM/100ML-% IV SOLN
2.0000 g | INTRAVENOUS | Status: DC
  Filled 2021-07-01: qty 100

## 2021-07-01 MED ORDER — ACETAMINOPHEN 325 MG PO TABS
325.0000 mg | ORAL_TABLET | Freq: Four times a day (QID) | ORAL | Status: DC | PRN
  Administered 2021-07-13: 650 mg via ORAL
  Filled 2021-07-01: qty 2

## 2021-07-01 SURGICAL SUPPLY — 102 items
ADH SKN CLS APL DERMABOND .7 (GAUZE/BANDAGES/DRESSINGS) ×2
APL PRP STRL LF DISP 70% ISPRP (MISCELLANEOUS) ×2
BAG COUNTER SPONGE SURGICOUNT (BAG) ×3 IMPLANT
BAG SPNG CNTER NS LX DISP (BAG) ×2
BANDAGE ESMARK 6X9 LF (GAUZE/BANDAGES/DRESSINGS) ×2 IMPLANT
BIT DRILL QC 2.0 SHORT EVOS SM (DRILL) IMPLANT
BIT DRILL QC 2.5MM SHRT EVO SM (DRILL) IMPLANT
BIT DRILL QC 2X140 (BIT) ×1 IMPLANT
BLADE CLIPPER SURG (BLADE) ×3 IMPLANT
BNDG CMPR 9X6 STRL LF SNTH (GAUZE/BANDAGES/DRESSINGS) ×2
BNDG COHESIVE 4X5 TAN STRL (GAUZE/BANDAGES/DRESSINGS) ×3 IMPLANT
BNDG ELASTIC 4X5.8 VLCR STR LF (GAUZE/BANDAGES/DRESSINGS) ×4 IMPLANT
BNDG ELASTIC 6X5.8 VLCR STR LF (GAUZE/BANDAGES/DRESSINGS) ×3 IMPLANT
BNDG ESMARK 6X9 LF (GAUZE/BANDAGES/DRESSINGS) ×3
BRUSH SCRUB EZ PLAIN DRY (MISCELLANEOUS) ×6 IMPLANT
CHLORAPREP W/TINT 26 (MISCELLANEOUS) ×3 IMPLANT
COVER SURGICAL LIGHT HANDLE (MISCELLANEOUS) ×3 IMPLANT
CUFF TOURN SGL QUICK 34 (TOURNIQUET CUFF) ×3
CUFF TOURN SGL QUICK 42 (TOURNIQUET CUFF) IMPLANT
CUFF TRNQT CYL 34X4.125X (TOURNIQUET CUFF) ×2 IMPLANT
DECANTER SPIKE VIAL GLASS SM (MISCELLANEOUS) IMPLANT
DERMABOND ADVANCED (GAUZE/BANDAGES/DRESSINGS) ×1
DERMABOND ADVANCED .7 DNX12 (GAUZE/BANDAGES/DRESSINGS) ×2 IMPLANT
DRAPE C-ARM 42X72 X-RAY (DRAPES) ×3 IMPLANT
DRAPE C-ARMOR (DRAPES) ×3 IMPLANT
DRAPE HALF SHEET 40X57 (DRAPES) ×3 IMPLANT
DRAPE IMP U-DRAPE 54X76 (DRAPES) ×3 IMPLANT
DRAPE ORTHO SPLIT 77X108 STRL (DRAPES) ×6
DRAPE SURG ORHT 6 SPLT 77X108 (DRAPES) ×4 IMPLANT
DRAPE U-SHAPE 47X51 STRL (DRAPES) ×3 IMPLANT
DRILL QC 2.0 SHORT EVOS SM (DRILL) ×3
DRILL QC 2.5MM SHORT EVOS SM (DRILL) ×3
DRSG ADAPTIC 3X8 NADH LF (GAUZE/BANDAGES/DRESSINGS) IMPLANT
DRSG MEPITEL 4X7.2 (GAUZE/BANDAGES/DRESSINGS) ×2 IMPLANT
DRSG PAD ABDOMINAL 8X10 ST (GAUZE/BANDAGES/DRESSINGS) ×2 IMPLANT
ELECT REM PT RETURN 9FT ADLT (ELECTROSURGICAL) ×3
ELECTRODE REM PT RTRN 9FT ADLT (ELECTROSURGICAL) ×2 IMPLANT
GAUZE SPONGE 4X4 12PLY STRL (GAUZE/BANDAGES/DRESSINGS) ×2 IMPLANT
GLOVE SURG ENC MOIS LTX SZ6.5 (GLOVE) ×9 IMPLANT
GLOVE SURG ENC MOIS LTX SZ7.5 (GLOVE) ×12 IMPLANT
GLOVE SURG UNDER POLY LF SZ6.5 (GLOVE) ×3 IMPLANT
GLOVE SURG UNDER POLY LF SZ7.5 (GLOVE) ×3 IMPLANT
GOWN STRL REUS W/ TWL LRG LVL3 (GOWN DISPOSABLE) ×4 IMPLANT
GOWN STRL REUS W/TWL LRG LVL3 (GOWN DISPOSABLE) ×6
IMMOBILIZER KNEE 22 UNIV (SOFTGOODS) ×3 IMPLANT
K-WIRE 1.6 (WIRE) ×9
K-WIRE FX150X1.6XTROC PNT (WIRE) ×6
KIT BASIN OR (CUSTOM PROCEDURE TRAY) ×3 IMPLANT
KIT INVISIKNOT ANKLE FRACTURE (Screw) ×1 IMPLANT
KIT TEMPLATE PATELLA ANTERIOR (ORTHOPEDIC DISPOSABLE SUPPLIES) ×1 IMPLANT
KIT TURNOVER KIT B (KITS) ×3 IMPLANT
KWIRE FX150X1.6XTROC PNT (WIRE) IMPLANT
MANIFOLD NEPTUNE II (INSTRUMENTS) ×3 IMPLANT
NDL HYPO 21X1.5 SAFETY (NEEDLE) IMPLANT
NDL HYPO 25GX1X1/2 BEV (NEEDLE) ×2 IMPLANT
NEEDLE 22X1 1/2 (OR ONLY) (NEEDLE) IMPLANT
NEEDLE HYPO 21X1.5 SAFETY (NEEDLE) IMPLANT
NEEDLE HYPO 25GX1X1/2 BEV (NEEDLE) ×3 IMPLANT
NS IRRIG 1000ML POUR BTL (IV SOLUTION) ×3 IMPLANT
PACK GENERAL/GYN (CUSTOM PROCEDURE TRAY) ×3 IMPLANT
PACK TOTAL JOINT (CUSTOM PROCEDURE TRAY) ×3 IMPLANT
PAD ARMBOARD 7.5X6 YLW CONV (MISCELLANEOUS) ×6 IMPLANT
PAD CAST 4YDX4 CTTN HI CHSV (CAST SUPPLIES) IMPLANT
PADDING CAST COTTON 4X4 STRL (CAST SUPPLIES) ×3
PADDING CAST COTTON 6X4 STRL (CAST SUPPLIES) IMPLANT
PADDING CAST SYNTHETIC 4 (CAST SUPPLIES) ×1
PADDING CAST SYNTHETIC 4X4 STR (CAST SUPPLIES) IMPLANT
PLATE FIB EVOS 2.7/3.5 7H R103 (Plate) ×1 IMPLANT
PLATE LOCK SM 3H ANT PAT 2.7 (Plate) ×1 IMPLANT
RETRIEVER SUT HEWSON (MISCELLANEOUS) IMPLANT
SCREW CORT 2.7X14 T8 EVOS (Screw) ×1 IMPLANT
SCREW CORT 2.7X15 T8 ST EVOS (Screw) ×2 IMPLANT
SCREW CORT 2.7X18 T8 ST EVOS (Screw) ×1 IMPLANT
SCREW CORT 3.5X11 ST EVOS (Screw) ×1 IMPLANT
SCREW CORT EVOS ST 3.5X12 (Screw) ×1 IMPLANT
SCREW CTX 3.5X38MM EVOS (Screw) ×1 IMPLANT
SCREW LOCK 2.7X13 ST EVOS (Screw) ×1 IMPLANT
SCREW LOCK VA ST 2.7X18 (Screw) ×3 IMPLANT
SCREW LOCK VA ST 2.7X20 (Screw) ×4 IMPLANT
SCREW LOCKING 2.7X16MM VA (Screw) ×4 IMPLANT
SPONGE T-LAP 18X18 ~~LOC~~+RFID (SPONGE) IMPLANT
STAPLER VISISTAT 35W (STAPLE) ×3 IMPLANT
SUCTION FRAZIER HANDLE 10FR (MISCELLANEOUS) ×3
SUCTION TUBE FRAZIER 10FR DISP (MISCELLANEOUS) ×2 IMPLANT
SUT ETHILON 3 0 PS 1 (SUTURE) ×9 IMPLANT
SUT FIBERWIRE #2 38 T-5 BLUE (SUTURE)
SUT FIBERWIRE #5 38 CONV NDL (SUTURE)
SUT MNCRL AB 3-0 PS2 18 (SUTURE) ×3 IMPLANT
SUT PROLENE 0 CT (SUTURE) IMPLANT
SUT VIC AB 0 CT1 27 (SUTURE) ×3
SUT VIC AB 0 CT1 27XBRD ANBCTR (SUTURE) ×2 IMPLANT
SUT VIC AB 1 CT1 27 (SUTURE) ×3
SUT VIC AB 1 CT1 27XBRD ANBCTR (SUTURE) ×2 IMPLANT
SUT VIC AB 2-0 CT1 27 (SUTURE) ×6
SUT VIC AB 2-0 CT1 TAPERPNT 27 (SUTURE) ×4 IMPLANT
SUTURE FIBERWR #2 38 T-5 BLUE (SUTURE) IMPLANT
SUTURE FIBERWR #5 38 CONV NDL (SUTURE) IMPLANT
SYR CONTROL 10ML LL (SYRINGE) ×3 IMPLANT
TOWEL GREEN STERILE (TOWEL DISPOSABLE) ×6 IMPLANT
TOWEL GREEN STERILE FF (TOWEL DISPOSABLE) ×3 IMPLANT
UNDERPAD 30X36 HEAVY ABSORB (UNDERPADS AND DIAPERS) ×3 IMPLANT
WATER STERILE IRR 1000ML POUR (IV SOLUTION) ×3 IMPLANT

## 2021-07-01 NOTE — Anesthesia Preprocedure Evaluation (Addendum)
Anesthesia Evaluation  Patient identified by MRN, date of birth, ID band Patient awake    Reviewed: Allergy & Precautions, NPO status , Patient's Chart, lab work & pertinent test results  Airway Mallampati: II  TM Distance: >3 FB Neck ROM: Full    Dental no notable dental hx. (+) Dental Advisory Given, Teeth Intact   Pulmonary neg pulmonary ROS, former smoker,    Pulmonary exam normal breath sounds clear to auscultation       Cardiovascular + DOE  Normal cardiovascular exam Rhythm:Regular Rate:Normal     Neuro/Psych negative neurological ROS     GI/Hepatic negative GI ROS, Neg liver ROS,   Endo/Other  negative endocrine ROS  Renal/GU negative Renal ROS     Musculoskeletal negative musculoskeletal ROS (+)   Abdominal (+) + obese,   Peds  Hematology negative hematology ROS (+)   Anesthesia Other Findings   Reproductive/Obstetrics                            Anesthesia Physical Anesthesia Plan  ASA: 2  Anesthesia Plan: General   Post-op Pain Management: GA combined w/ Regional for post-op pain   Induction: Intravenous  PONV Risk Score and Plan: 3 and Ondansetron, Dexamethasone, Treatment may vary due to age or medical condition, Midazolam and Scopolamine patch - Pre-op  Airway Management Planned: Oral ETT  Additional Equipment:   Intra-op Plan:   Post-operative Plan: Extubation in OR  Informed Consent: I have reviewed the patients History and Physical, chart, labs and discussed the procedure including the risks, benefits and alternatives for the proposed anesthesia with the patient or authorized representative who has indicated his/her understanding and acceptance.     Dental advisory given  Plan Discussed with: CRNA  Anesthesia Plan Comments:       Anesthesia Quick Evaluation

## 2021-07-01 NOTE — Anesthesia Postprocedure Evaluation (Signed)
Anesthesia Post Note  Patient: Courtney Berg  Procedure(s) Performed: OPEN REDUCTION INTERNAL FIXATION (ORIF) ANKLE FRACTURE (Right: Ankle) OPEN REDUCTION INTERNAL (ORIF) FIXATION PATELLA (Left: Knee)     Patient location during evaluation: PACU Anesthesia Type: General Level of consciousness: sedated and patient cooperative Pain management: pain level controlled Vital Signs Assessment: post-procedure vital signs reviewed and stable Respiratory status: spontaneous breathing Cardiovascular status: stable Anesthetic complications: no   No notable events documented.  Last Vitals:  Vitals:   07/01/21 1522 07/01/21 1725  BP: (!) 125/55 118/68  Pulse: 68 69  Resp: 12 12  Temp: 36.6 C 36.7 C  SpO2:  98%    Last Pain:  Vitals:   07/01/21 1725  TempSrc: Oral  PainSc:                  [name]

## 2021-07-01 NOTE — Anesthesia Procedure Notes (Addendum)
Anesthesia Regional Block: Femoral nerve block   Pre-Anesthetic Checklist: , timeout performed,  Correct Patient, Correct Site, Correct Laterality,  Correct Procedure, Correct Position, site marked,  Risks and benefits discussed,  Surgical consent,  Pre-op evaluation,  At surgeon's request and post-op pain management  Laterality: Lower and Left  Prep: chloraprep       Needles:  Injection technique: Single-shot  Needle Type: Stimulator Needle - 80     Needle Length: 9cm  Needle Gauge: 22   Needle insertion depth: 6 cm   Additional Needles:   Procedures:,,,, ultrasound used (permanent image in chart),,    Narrative:  Start time: 07/01/2021 10:10 AM End time: 07/01/2021 10:20 AM Injection made incrementally with aspirations every 5 mL.  Performed by: Personally  Anesthesiologist: [name], MD  Additional Notes: BP cuff, EKG monitors applied. Sedation begun. Femoral artery palpated for location of nerve. After nerve location verified with U/S, anesthetic injected incrementally, slowly, and after negative aspirations under direct u/s guidance. Good perineural spread. Patient tolerated well.

## 2021-07-01 NOTE — Anesthesia Procedure Notes (Signed)
Anesthesia Regional Block: Popliteal block   Pre-Anesthetic Checklist: , timeout performed,  Correct Patient, Correct Site, Correct Laterality,  Correct Procedure, Correct Position, site marked,  Risks and benefits discussed,  Surgical consent,  Pre-op evaluation,  At surgeon's request and post-op pain management  Laterality: Lower and Right  Prep: chloraprep       Needles:  Injection technique: Single-shot  Needle Type: Stimiplex     Needle Length: 10cm  Needle Gauge: 21     Additional Needles:   Procedures:,,,, ultrasound used (permanent image in chart),,   Motor weakness within 5 minutes.  Narrative:  Start time: 07/01/2021 10:22 AM End time: 07/01/2021 10:30 AM Injection made incrementally with aspirations every 5 mL.  Performed by: Personally  Anesthesiologist: [name], MD  Additional Notes: Nerve located and needle positioned with direct ultrasound guidance. Good perineural spread. Patient tolerated well.

## 2021-07-01 NOTE — Progress Notes (Addendum)
PROGRESS NOTE    Courtney Berg  [id number] DOB: [birth year] DOA: 06/26/2021 PCP: Susy Frizzle, MD   Brief Narrative:  The patient is a 65 year old obese Caucasian female with a past medical history significant for but not limited to history of allergies as well as other comorbidities who presented to ED after fall.  She fell downstairs at her work and she landed on her left knee and also noted to have significant right ankle pain and deformity.  Left knee showed patellar fracture and right ankle and foot x-ray showed distal tibia and fibular fracture as well as dislocated ankle.  She was admitted for her trimalleolar ankle fracture and left patellar fracture and orthopedic surgery was consulted.  Orthopedic surgery is planning on taking the patient for surgical intervention today for ORIF.  She will need PT OT to further evaluate and treat.  Assessment & Plan:   Principal Problem:   Tibia/fibula fracture, right, closed, initial encounter Active Problems:   Allergies   Left patella fracture  Right Trimalleolar Ankle Fracture -Had a Mechanical Fall approximately down 3 stairs afer her heel slipped -DG Ankle showed "Interval reduction of fractures and talar dislocation." -CT Ankle showed "Complex comminuted spiral type fracture involving the distal fibular shaft at and above the level of the ankle mortise. Oblique coursing comminuted longitudinal intra-articular fracture involving the posterior malleolus of the tibia. Maximum posterior displacement is 7 mm. Maximum gap at the articular surface is 5 mm. Small impaction type fracture involving the medial and anterior aspect of the talar dome. Slight widening of the medial mortise and a few tiny bony densities near the talus. Possible deltoid ligament injury."  -C/w Pain Control  -Orthopedics planning ORIF and contacting [facility] for definitive care  -Surgery on Wednesday 07/01/21 by Dr. Doreatha Martin   Left Patella  Fracture -DG Left Knee showed "There are changes consistent with a transverse fracture through the patella. There is distraction of the fracture fragments of at least 3 cm. Fat fluid effusion is noted between the fracture  fragments. Additionally on the frontal film there is fragmentation of the more superior fragment identified" -Left leg in knee immobilizer -Surgery today on Wednesday 07/01/21   Acute Urinary Retention -Bladder scan obtained the day before yesterday showed 800 cc urine -Likely induced by opioids -Continue in-and-out cath every 6 hours as needed -Improving and continue to Monitor  Hyperglycemia -Patient's Blood Sugar on Admission was 151 on CMP and repeat was 127 yesterday and 113 today  -Continue to Monitor Blood Sugars per Protocol and if Necessary will place on Sensitive Novolog SSI AC -Check HbA1c in the AM   Normocytic Anemia -Patient's Hgb/Hct went from 11.5/34.7 -> 10.7/32.6 -> 11.3/34.3 -> 12.0/36.9 -Check Anemia Panel in the AM -Continue to Monitor for S/Sx of Bleeding; Currently no overt bleeding noted -Repeat CBC in the AM   Hyponatremia -Mild at 133 yesterday with repeat improved to 138 -Continue to Monitor and Trend -Repeat CMP in the AM   Obesity -Complicates overall prognosis and care -Estimated body mass index is 30.41 kg/m as calculated from the following:   Height as of this encounter: '5\' 8"'$  (1.727 m).   Weight as of this encounter: 90.7 kg. -Weight Loss and Dietary Counseling given   DVT prophylaxis: Enoxaparin 40 mg sq q24h Code Status: FULL CODE Family Communication: Spoke with Husband at bedside  Disposition Plan: Pending further clinical improvement and clearance by Orthopedic Surgery and evaluation by PT/OT   Status is: Inpatient  Remains inpatient  appropriate because:Unsafe d/c plan, IV treatments appropriate due to intensity of illness or inability to take PO, and Inpatient level of care appropriate due to severity of  illness  Dispo: The patient is from: Home              Anticipated d/c is to:  TBD              Patient currently is not medically stable to d/c.   Difficult to place patient No   Consultants:  Orthopedic Surgery General Surgery (Trauma Consult)  Procedures:  OPEN REDUCTION INTERNAL FIXATION (ORIF) ANKLE FRACTURE (Right) OPEN REDUCTION INTERNAL (ORIF) FIXATION PATELLA (Left)  Antimicrobials:  Anti-infectives (From admission, onward)    Start     Dose/Rate Route Frequency Ordered Stop   07/01/21 0800  ceFAZolin (ANCEF) IVPB 2g/100 mL premix        2 g 200 mL/hr over 30 Minutes Intravenous On call to O.R. 07/01/21 0701 07/02/21 0559       Subjective: Seen and examined at bedside and she was doing okay and still had some pain in her right ankle.  No chest pain or shortness breath.  Denies any lightheadedness or dizziness.  No other concerns or complaints at this time.  Objective: Vitals:   07/01/21 1025 07/01/21 1030 07/01/21 1035 07/01/21 1040  BP: (!) 132/51 (!) 117/48 (!) 137/57 (!) 103/46  Pulse: 77 79 82 84  Resp: '18 13 16 15  '$ Temp:      TempSrc:      SpO2: 96% 96% 98% 93%  Weight:      Height:        Intake/Output Summary (Last 24 hours) at 07/01/2021 1120 Last data filed at 07/01/2021 0915 Gross per 24 hour  Intake --  Output 2 ml  Net -2 ml   Filed Weights   07/01/21 0700 07/01/21 0933  Weight: 93.4 kg 90.7 kg   Examination: Physical Exam:  Constitutional: WN/WD obese Caucasian female in NAD and appears calm but a little uncomfortable Eyes: Lids and conjunctivae normal, sclerae anicteric  ENMT: External Ears, Nose appear normal. Grossly normal hearing. Neck: Appears normal, supple, no cervical masses, normal ROM, no appreciable thyromegaly; no JVD Respiratory: Diminished to auscultation bilaterally, no wheezing, rales, rhonchi or crackles. Normal respiratory effort and patient is not tachypenic. No accessory muscle use. Unlabored breathing   Cardiovascular: RRR, no murmurs / rubs / gallops. S1 and S2 auscultated.  Abdomen: Soft, non-tender, Distended 2/2 body habitus. No masses palpated. No appreciable hepatosplenomegaly. Bowel sounds positive.  GU: Deferred. Musculoskeletal: No clubbing / cyanosis of digits/nails. Left Knee is in a immobilizer and Right ankle is wrapped Skin: No rashes, lesions, ulcers. No induration; Warm and dry.  Neurologic: CN 2-12 grossly intact with no focal deficits. Romberg sign and cerebellar reflexes not assessed.  Psychiatric: Normal judgment and insight. Alert and oriented x 3. Normal mood and appropriate affect.   Data Reviewed: I have personally reviewed following labs and imaging studies  CBC: Recent Labs  Lab 06/26/21 1900 06/27/21 0222 06/28/21 0113 06/30/21 0305 07/01/21 0818  WBC 14.6* 10.4 7.8 8.9 8.9  NEUTROABS 12.3*  --   --   --  5.8  HGB 12.5 11.5* 10.7* 11.3* 12.0  HCT 38.7 34.7* 32.6* 34.3* 36.9  MCV 96.0 94.6 95.0 94.8 93.9  PLT 306 294 266 303 [id number]   Basic Metabolic Panel: Recent Labs  Lab 06/26/21 1900 06/27/21 0222 06/28/21 0113 07/01/21 0818  NA 139 138 133* 138  K 3.5  4.2 3.9 4.3  CL 106 104 98 102  CO2 '24 27 29 28  '$ GLUCOSE 116* 151* 127* 113*  BUN '11 13 12 15  '$ CREATININE 0.80 0.82 0.87 0.72  CALCIUM 8.9 8.7* 8.7* 9.1  MG  --   --   --  1.9  PHOS  --   --   --  3.9   GFR: Estimated Creatinine Clearance: 84.8 mL/min (by C-G formula based on SCr of 0.72 mg/dL). Liver Function Tests: Recent Labs  Lab 06/26/21 1900 07/01/21 0818  AST 22 22  ALT 11 20  ALKPHOS 42 40  BILITOT 0.8 0.5  PROT 6.4* 6.7  ALBUMIN 3.7 3.5   No results for input(s): LIPASE, AMYLASE in the last 168 hours. No results for input(s): AMMONIA in the last 168 hours. Coagulation Profile: No results for input(s): INR, PROTIME in the last 168 hours. Cardiac Enzymes: No results for input(s): CKTOTAL, CKMB, CKMBINDEX, TROPONINI in the last 168 hours. BNP (last 3 results) No results  for input(s): PROBNP in the last 8760 hours. HbA1C: No results for input(s): HGBA1C in the last 72 hours. CBG: No results for input(s): GLUCAP in the last 168 hours. Lipid Profile: No results for input(s): CHOL, HDL, LDLCALC, TRIG, CHOLHDL, LDLDIRECT in the last 72 hours. Thyroid Function Tests: No results for input(s): TSH, T4TOTAL, FREET4, T3FREE, THYROIDAB in the last 72 hours. Anemia Panel: No results for input(s): VITAMINB12, FOLATE, FERRITIN, TIBC, IRON, RETICCTPCT in the last 72 hours. Sepsis Labs: No results for input(s): PROCALCITON, LATICACIDVEN in the last 168 hours.  Recent Results (from the past 240 hour(s))  Resp Panel by RT-PCR (Flu A&B, Covid) Nasopharyngeal Swab     Status: None   Collection Time: 06/26/21  9:13 PM   Specimen: Nasopharyngeal Swab; Nasopharyngeal(NP) swabs in vial transport medium  Result Value Ref Range Status   SARS Coronavirus 2 by RT PCR NEGATIVE NEGATIVE Final    Comment: (NOTE) SARS-CoV-2 target nucleic acids are NOT DETECTED.  The SARS-CoV-2 RNA is generally detectable in upper respiratory specimens during the acute phase of infection. The lowest concentration of SARS-CoV-2 viral copies this assay can detect is 138 copies/mL. A negative result does not preclude SARS-Cov-2 infection and should not be used as the sole basis for treatment or other patient management decisions. A negative result may occur with  improper specimen collection/handling, submission of specimen other than nasopharyngeal swab, presence of viral mutation(s) within the areas targeted by this assay, and inadequate number of viral copies(<138 copies/mL). A negative result must be combined with clinical observations, patient history, and epidemiological information. The expected result is Negative.  Fact Sheet for Patients:  EntrepreneurPulse.com.au  Fact Sheet for Healthcare Providers:  IncredibleEmployment.be  This test is no t yet  approved or cleared by the [place] FDA and  has been authorized for detection and/or diagnosis of SARS-CoV-2 by FDA under an Emergency Use Authorization (EUA). This EUA will remain  in effect (meaning this test can be used) for the duration of the COVID-19 declaration under Section 564(b)(1) of the Act, 21 U.S.C.section 360bbb-3(b)(1), unless the authorization is terminated  or revoked sooner.       Influenza A by PCR NEGATIVE NEGATIVE Final   Influenza B by PCR NEGATIVE NEGATIVE Final    Comment: (NOTE) The Xpert Xpress SARS-CoV-2/FLU/RSV plus assay is intended as an aid in the diagnosis of influenza from Nasopharyngeal swab specimens and should not be used as a sole basis for treatment. Nasal washings and aspirates are unacceptable  for Xpert Xpress SARS-CoV-2/FLU/RSV testing.  Fact Sheet for Patients: EntrepreneurPulse.com.au  Fact Sheet for Healthcare Providers: IncredibleEmployment.be  This test is not yet approved or cleared by the [place] FDA and has been authorized for detection and/or diagnosis of SARS-CoV-2 by FDA under an Emergency Use Authorization (EUA). This EUA will remain in effect (meaning this test can be used) for the duration of the COVID-19 declaration under Section 564(b)(1) of the Act, 21 U.S.C. section 360bbb-3(b)(1), unless the authorization is terminated or revoked.  Performed at [facility], [place] [address] [place], [place] [address]   Surgical pcr screen     Status: None   Collection Time: 06/27/21  8:20 PM   Specimen: Nasal Mucosa; Nasal Swab  Result Value Ref Range Status   MRSA, PCR NEGATIVE NEGATIVE Final   Staphylococcus aureus NEGATIVE NEGATIVE Final    Comment: (NOTE) The Xpert SA Assay (FDA approved for NASAL specimens in patients 42 years of age and older), is one component of a comprehensive surveillance program. It is not intended to diagnose infection nor to guide or monitor  treatment. Performed at [facility], [place] [address], [place], [place] [address]      RN Pressure Injury Documentation:   Estimated body mass index is 30.41 kg/m as calculated from the following:   Height as of this encounter: '5\' 8"'$  (1.727 m).   Weight as of this encounter: 90.7 kg.  Malnutrition Type:   Malnutrition Characteristics:    Nutrition Interventions:     Radiology Studies: No results found.  Scheduled Meds:  chlorhexidine  60 mL Topical Once   [MAR Hold] enoxaparin (LOVENOX) injection  40 mg Subcutaneous Q24H   scopolamine  1 patch Transdermal Q72H   Continuous Infusions:   ceFAZolin (ANCEF) IV     lactated ringers 10 mL/hr at 07/01/21 1001    LOS: 4 days   Kerney Elbe, DO [facility] PAGER is on AMION  If 7PM-7AM, please contact night-coverage [email or web address]

## 2021-07-01 NOTE — Transfer of Care (Signed)
Immediate Anesthesia Transfer of Care Note  Patient: Courtney Berg  Procedure(s) Performed: OPEN REDUCTION INTERNAL FIXATION (ORIF) ANKLE FRACTURE (Right: Ankle) OPEN REDUCTION INTERNAL (ORIF) FIXATION PATELLA (Left: Knee)  Patient Location: PACU  Anesthesia Type:General  Level of Consciousness: drowsy  Airway & Oxygen Therapy: Patient Spontanous Breathing and Patient connected to face mask oxygen  Post-op Assessment: Report given to RN and Post -op Vital signs reviewed and stable  Post vital signs: Reviewed and stable  Last Vitals:  Vitals Value Taken Time  BP 121/60 07/01/21 1352  Temp 36.5 C 07/01/21 1352  Pulse 82 07/01/21 1354  Resp 19 07/01/21 1354  SpO2 99 % 07/01/21 1354  Vitals shown include unvalidated device data.  Last Pain:  Vitals:   07/01/21 1040  TempSrc:   PainSc: 0-No pain      Patients Stated Pain Goal: 3 ([id number])  Complications: No notable events documented.

## 2021-07-01 NOTE — Interval H&P Note (Signed)
History and Physical Interval Note:  07/01/2021 10:49 AM  Courtney Berg  has presented today for surgery, with the diagnosis of Left Patella Fracture, Right Ankle Fracture.  The various methods of treatment have been discussed with the patient and family. After consideration of risks, benefits and other options for treatment, the patient has consented to  Procedure(s): OPEN REDUCTION INTERNAL FIXATION (ORIF) ANKLE FRACTURE (Right) OPEN REDUCTION INTERNAL (ORIF) FIXATION PATELLA (Left) as a surgical intervention.  The patient's history has been reviewed, patient examined, no change in status, stable for surgery.  I have reviewed the patient's chart and labs.  Questions were answered to the patient's satisfaction.     Lennette Bihari P Alexas Basulto

## 2021-07-01 NOTE — Progress Notes (Signed)
OT Cancellation Note  Patient Details Name: AIRIEL ANTOINE MRN: [id number] DOB: [birth year]   Cancelled Treatment:    Reason Eval/Treat Not Completed: Medical issues which prohibited therapy;Patient at procedure or test/ unavailable (Pt with plans for L&R LE ORIF this a.m.; OT evaluation to f/u after fixation and weightbearing/ROM orders are placed.)  Chaquita Basques A Lavren Lewan 07/01/2021, 9:10 AM

## 2021-07-01 NOTE — Progress Notes (Signed)
Orthopedic Tech Progress Note Patient Details:  BELLAMAE BIGNELL [birth year] [id number]  Ortho Devices Type of Ortho Device: CAM walker Splint Material: Fiberglass Ortho Device/Splint Location: RLE Ortho Device/Splint Interventions: Ordered, Application   Post Interventions Patient Tolerated: Well Instructions Provided: Other (comment)  Janit Pagan 07/01/2021, 2:49 PM

## 2021-07-01 NOTE — Op Note (Signed)
Orthopaedic Surgery Operative Note (CSN: [id number] ) Date of Surgery: 07/01/2021  Admit Date: 06/26/2021   Diagnoses: Pre-Op Diagnoses: Right bimalleolar ankle fracture/dislocation Left patella fracture  Post-Op Diagnosis: Same  Procedures: CPT 27814-Open reduction internal fixation of right bimalleolar ankle fracture CPT 27829-Repair of right ankle syndesmosis CPT 27524-Open reduction internal fixation of left patella  Surgeons : Primary: Shona Needles, MD  Assistant: Patrecia Pace, PA-C  Location: OR 7   Anesthesia:General with regional blocks   Antibiotics: Ancef 2g preop with 1 gm vancomycin powder placed topically in bilateral incisions   Tourniquet time:None    Estimated Blood IOEV:03 mL  Complications:None  Specimens:None  Implants: Implant Name Type Inv. Item Serial No. Manufacturer Lot No. LRB No. Used Action  KIT INVISIKNOT ANKLE FRACTURE - [id number] Screw KIT INVISIKNOT ANKLE FRACTURE  [employer]   1 Implanted  PLATE FIB EVOS 9.9/3.7 7H R103 - [id number] Plate PLATE FIB EVOS 1.0/1.7 7H R103  [employer]   1 Implanted  SCREW CORT EVOS ST 3.5X12 - [id number] Screw SCREW CORT EVOS ST 3.5X12  [employer]  Right 1 Implanted  SCREW CORT 3.5X11 ST EVOS - [id number] Screw SCREW CORT 3.5X11 ST EVOS  [employer]  Right 1 Implanted  SCREW CTX 3.5X38MM EVOS - [id number] Screw SCREW CTX 3.5X38MM EVOS  [employer]  Right 1 Implanted  SCREW CORT 2.7X18 T8 ST EVOS - [id number] Screw SCREW CORT 2.7X18 T8 ST EVOS  [employer]  Right 1 Implanted  SCREW CORT 2.7X15 T8 ST EVOS - [id number] Screw SCREW CORT 2.7X15 T8 ST EVOS  [employer]  Right 1 Implanted  SCREW CORT 2.7X14 T8 EVOS - [id number] Screw SCREW CORT 2.7X14 T8 EVOS  [employer]  Right 1 Implanted  SCREW LOCK 2.7X13 ST EVOS - [id number] Screw SCREW LOCK 2.7X13 ST EVOS  [employer]  ORTH[employer]  Right 1 Implanted  PLATE LOCK VA 2.7 3H - [id number] Plate PLATE LOCK VA 2.7 3H  [employer] [id number] Right 1 Implanted  SCREW LOCKING 2.7X20MM - [id number] Screw SCREW LOCKING 2.7X20MM  [employer] ORTH[employer]   3 Implanted  SCREW LOCKING 2.7X22MM - [id number] Screw SCREW LOCKING 2.7X22MM  DEPU[employer] ORTH[employer]AED[employer]   1 Implanted  SCREW LOCKING 2.7X16MM VA - [id number] Screw SCREW LOCKING 2.7X16MM VA  [employer] ORTH[employer]   3 Implanted  SCREW LOCKING 2.7X18MM - [id number] Screw SCREW LOCKING 2.7X18MM  DEPU[employer] ORTHOPAED[employer]   3 Implanted     Indications for Surgery: 65 year old female fell and sustained a right bimalleolar ankle fracture dislocation and a left patella fracture.  Due to the unstable nature of her injuries I recommended proceeding with open reduction internal fixation of both.  Risks and benefits were discussed with the patient.  Risks included but not limited to bleeding, infection, malunion, nonunion, hardware failure, hardware irritation, nerve and blood vessel injury, knee stiffness, ankle stiffness, ankle arthritis, knee arthritis, DVT, even possible anesthetic complications.  The patient agreed to proceed with surgery and consent was obtained.  Operative Findings: 1.  Open reduction internal fixation of right bimalleolar ankle fracture using [employer]hew EVOS 3.5/2.7 mm distal fibular locking plate for the distal fibula and a independent 3.5 millimeter screw from anterior to posterior for the posterior malleolus fracture 2.  External rotation stress view with some medial clear space widening treated with Tamala Julian & Nephew Invisiknot suture anchor 3.  Open reduction internal fixation of left patella fracture using Synthes  2.7 mm VA patella plate  Procedure: The patient was identified in the preoperative holding area. Consent was confirmed with the patient and their family and all questions were answered. The operative extremity was marked after confirmation with the patient. she was  then brought back to the operating room by our anesthesia colleagues.  She was placed under general anesthetic and carefully transferred over to a radiolucent flat top table.  A bump was placed under her right hip.  Bilateral lower extremities were prepped and draped in usual sterile fashion.  A timeout was performed to verify the patient, the procedure, and the extremities.  Preoperative antibiotics were dosed.  I first started with the right ankle.  Fluoroscopic imaging was obtained that showed the unstable nature of her injury.  A direct lateral approach to the distal fibula was carried down through skin simultaneous tissue.  I took care to protect the superficial peroneal nerve during my dissection.  I exposed the fracture site and it was a Weber C oblique fracture with a small amount of posterior comminution.  I used a reduction tenaculum to anatomically reduce the fracture.  The mortise was well reduced after reduction.  I held provisionally with 1.6 mm K wires.  I then contoured a Smith [employer] Nephew EVOS 3.5 mm / 2.7 mm distal fibular locking plate.  I held provisionally to the fibular shaft using a 3.5 mm nonlocking screw.  I then brought the plate flush to bone distally with a 2.7 mm nonlocking screw.  I then placed locking screws in all of the distal fibular holes.  I returned to the fibular shaft and placed 3.5 mm nonlocking screws.  Lateral view showed some displacement of the articular surface of the posterior malleolus.  I used a reduction tenaculum to place posterior to the fibula to grasped the posterior malleolus fracture and percutaneously placed the other tine along the anterior lateral aspect of the distal tibia.  Was able to reduce and clamp the fracture in near anatomic position.  I then placed a 3.5 millimeter screw from anterior to posterior to hold this reduction.  An external rotation stress view was then performed and there was a slight amount of medial clear space widening.  As result I  felt that syndesmotic fixation would be appropriate.  Using the drill bit from the [facility] I then drilled through the fibula and tibia and brought the suture repair fixation through the medial side cut down on this and flipped the button flush to the medial cortex.  I then tightened the suture with the ankle in full dorsiflexion.  It was then tied down.  Final fluoroscopic imaging of the ankle was performed.  The incision was copiously irrigated.  A gram of vancomycin powder was placed into the incision.  A layered closure of 2-0 Vicryl and 3-0 nylon was used to close the skin.  I then turned my attention to the left lower extremity.  Fluoroscopic imaging was obtained.  A direct anterior approach to the patella was made.  There was some comminution of the lateral aspect of the superior pole of patella and inferior pole of patella.  I encountered a hematoma which I removed.  The medial lateral retinaculum were disrupted.  I then used a reduction tenaculum to reduce the inferior pole to the superior pole.  I placed 2 Clamps 1 medial and 1 lateral to reduce the fragments that were comminuted.  I then used K wires to provisionally hold the reduction.  I then placed a Synthes 2.7 mm VA patella plate anteriorly and contoured this to fit the patella.  I then drilled and placed locking screws to hold the fixation in place.  I got excellent fixation and confirmed that no screws were intra-articular.  I then reinforced my repair with a #5 FiberWire through the quadriceps tendon in a figure-of-eight fashion to the patellar tendon.  It was brought through the inferior holes of the patella plate.  Final fluoroscopic imaging was obtained.  The incision was copiously irrigated.  A gram of vancomycin powder was placed into the incision.  The retinaculum was closed with 0 Vicryl suture.  The skin was closed with 0 Vicryl, 2-0 Vicryl and 3-0 nylon.  Sterile dressings were placed to both of the lower extremities.   A well-padded short leg splint was applied to the right lower ankle.  A knee immobilizer was placed to the left knee.  The patient was then awoken from anesthesia and taken to the PACU in stable condition.  Post Op Plan/Instructions: The patient will be weightbearing as tolerated to left lower extremity with the knee locked in extension.  She will be nonweightbearing to the right lower extremity.  She will receive Lovenox for DVT prophylaxis.  She will receive Ancef for surgical prophylaxis.  We will have her mobilize with physical and Occupational Therapy.  I was present and performed the entire surgery.  Patrecia Pace, PA-C did assist me throughout the case. An assistant was necessary given the difficulty in approach, maintenance of reduction and ability to instrument the fracture.   Katha Hamming, MD [facility]

## 2021-07-01 NOTE — Progress Notes (Signed)
PT Cancellation Note  Patient Details Name: Courtney Berg MRN: [id number] DOB: [birth year]   Cancelled Treatment:    Reason Eval/Treat Not Completed: Patient at procedure or test/unavailable.  Will await instructions post op and follow up with pt when appropriate.   Ramond Dial 07/01/2021, 9:57 AM  Mee Hives, PT MS Acute Rehab Dept. Number: [place] and [place]

## 2021-07-01 NOTE — Anesthesia Procedure Notes (Signed)
Anesthesia Regional Block: Adductor canal block   Pre-Anesthetic Checklist: , timeout performed,  Correct Patient, Correct Site, Correct Laterality,  Correct Procedure, Correct Position, site marked,  Risks and benefits discussed,  Surgical consent,  Pre-op evaluation,  At surgeon's request and post-op pain management  Laterality: Lower and Right  Prep: chloraprep       Needles:  Injection technique: Single-shot  Needle Type: Stimiplex     Needle Length: 9cm  Needle Gauge: 21     Additional Needles:   Procedures:,,,, ultrasound used (permanent image in chart),,    Narrative:  Start time: 07/01/2021 10:15 AM End time: 07/01/2021 10:22 AM Injection made incrementally with aspirations every 5 mL.  Performed by: Personally  Anesthesiologist: [name], MD  Additional Notes: BP cuff, EKG monitors applied. Sedation begun. Artery and nerve location verified with ultrasound. Anesthetic injected incrementally (52m), slowly, and after negative aspirations under direct u/s guidance. Good fascial/perineural spread. Tolerated well.

## 2021-07-01 NOTE — Anesthesia Procedure Notes (Signed)
Procedure Name: Intubation Date/Time: 07/01/2021 11:35 AM Performed by: Clearnce Sorrel, CRNA Pre-anesthesia Checklist: Patient identified, Emergency Drugs available, Suction available and Patient being monitored Patient Re-evaluated:Patient Re-evaluated prior to induction Oxygen Delivery Method: Circle System Utilized Preoxygenation: Pre-oxygenation with 100% oxygen Induction Type: IV induction Ventilation: Mask ventilation without difficulty Laryngoscope Size: Mac and 3 Grade View: Grade I Tube type: Oral Tube size: 7.0 mm Number of attempts: 1 Airway Equipment and Method: Stylet and Oral airway Placement Confirmation: ETT inserted through vocal cords under direct vision, positive ETCO2 and breath sounds checked- equal and bilateral Secured at: 23 cm Tube secured with: Tape Dental Injury: Teeth and Oropharynx as per pre-operative assessment

## 2021-07-02 ENCOUNTER — Encounter (HOSPITAL_COMMUNITY): Payer: Self-pay | Admitting: Student

## 2021-07-02 DIAGNOSIS — S82201A Unspecified fracture of shaft of right tibia, initial encounter for closed fracture: Secondary | ICD-10-CM | POA: Diagnosis not present

## 2021-07-02 DIAGNOSIS — T7840XA Allergy, unspecified, initial encounter: Secondary | ICD-10-CM | POA: Diagnosis not present

## 2021-07-02 DIAGNOSIS — S82002A Unspecified fracture of left patella, initial encounter for closed fracture: Secondary | ICD-10-CM | POA: Diagnosis not present

## 2021-07-02 DIAGNOSIS — D519 Vitamin B12 deficiency anemia, unspecified: Secondary | ICD-10-CM

## 2021-07-02 DIAGNOSIS — I959 Hypotension, unspecified: Secondary | ICD-10-CM

## 2021-07-02 DIAGNOSIS — D62 Acute posthemorrhagic anemia: Secondary | ICD-10-CM

## 2021-07-02 DIAGNOSIS — D649 Anemia, unspecified: Secondary | ICD-10-CM | POA: Diagnosis not present

## 2021-07-02 DIAGNOSIS — D509 Iron deficiency anemia, unspecified: Secondary | ICD-10-CM

## 2021-07-02 LAB — COMPREHENSIVE METABOLIC PANEL
ALT: 16 U/L (ref 0–44)
AST: 20 U/L (ref 15–41)
Albumin: 3 g/dL — ABNORMAL LOW (ref 3.5–5.0)
Alkaline Phosphatase: 35 U/L — ABNORMAL LOW (ref 38–126)
Anion gap: 6 (ref 5–15)
BUN: 14 mg/dL (ref 8–23)
CO2: 27 mmol/L (ref 22–32)
Calcium: 8.9 mg/dL (ref 8.9–10.3)
Chloride: 104 mmol/L (ref 98–111)
Creatinine, Ser: 0.67 mg/dL (ref 0.44–1.00)
GFR, Estimated: >60 mL/min (ref >60)
Glucose, Bld: 155 mg/dL — ABNORMAL HIGH (ref 70–99)
Potassium: 4.4 mmol/L (ref 3.5–5.1)
Sodium: 137 mmol/L (ref 135–145)
Total Bilirubin: 0.4 mg/dL (ref 0.3–1.2)
Total Protein: 6 g/dL — ABNORMAL LOW (ref 6.5–8.1)

## 2021-07-02 LAB — CBC WITH DIFFERENTIAL/PLATELET
Abs Immature Granulocytes: 0.07 10*3/uL (ref 0.00–0.07)
Basophils Absolute: 0 10*3/uL (ref 0.0–0.1)
Basophils Relative: 0 %
Eosinophils Absolute: 0 10*3/uL (ref 0.0–0.5)
Eosinophils Relative: 0 %
HCT: 29 % — ABNORMAL LOW (ref 36.0–46.0)
Hemoglobin: 9.6 g/dL — ABNORMAL LOW (ref 12.0–15.0)
Immature Granulocytes: 1 %
Lymphocytes Relative: 5 %
Lymphs Abs: 0.5 10*3/uL — ABNORMAL LOW (ref 0.7–4.0)
MCH: 31.2 pg (ref 26.0–34.0)
MCHC: 33.1 g/dL (ref 30.0–36.0)
MCV: 94.2 fL (ref 80.0–100.0)
Monocytes Absolute: 0.6 10*3/uL (ref 0.1–1.0)
Monocytes Relative: 6 %
Neutro Abs: 8.8 10*3/uL — ABNORMAL HIGH (ref 1.7–7.7)
Neutrophils Relative %: 88 %
Platelets: 291 10*3/uL (ref 150–400)
RBC: 3.08 MIL/uL — ABNORMAL LOW (ref 3.87–5.11)
RDW: 13.3 % (ref 11.5–15.5)
WBC: 10 10*3/uL (ref 4.0–10.5)
nRBC: 0 % (ref 0.0–0.2)

## 2021-07-02 LAB — RETICULOCYTES
Immature Retic Fract: 14.6 % (ref 2.3–15.9)
RBC.: 3.15 MIL/uL — ABNORMAL LOW (ref 3.87–5.11)
Retic Count, Absolute: 57.3 10*3/uL (ref 19.0–186.0)
Retic Ct Pct: 1.8 % (ref 0.4–3.1)

## 2021-07-02 LAB — HEMOGLOBIN A1C
Hgb A1c MFr Bld: 5.8 % — ABNORMAL HIGH (ref 4.8–5.6)
Mean Plasma Glucose: 119.76 mg/dL

## 2021-07-02 LAB — IRON AND TIBC
Iron: 21 ug/dL — ABNORMAL LOW (ref 28–170)
Saturation Ratios: 8 % — ABNORMAL LOW (ref 10.4–31.8)
TIBC: 256 ug/dL (ref 250–450)
UIBC: 235 ug/dL

## 2021-07-02 LAB — FERRITIN: Ferritin: 170 ng/mL (ref 11–307)

## 2021-07-02 LAB — PHOSPHORUS: Phosphorus: 2.5 mg/dL (ref 2.5–4.6)

## 2021-07-02 LAB — VITAMIN B12: Vitamin B-12: 133 pg/mL — ABNORMAL LOW (ref 180–914)

## 2021-07-02 LAB — MAGNESIUM: Magnesium: 2 mg/dL (ref 1.7–2.4)

## 2021-07-02 LAB — FOLATE: Folate: 8.5 ng/mL (ref >5.9)

## 2021-07-02 MED ORDER — VITAMIN D 25 MCG (1000 UNIT) PO TABS
2000.0000 [IU] | ORAL_TABLET | Freq: Every day | ORAL | Status: DC
  Administered 2021-07-02 – 2021-07-13 (×12): 2000 [IU] via ORAL
  Filled 2021-07-02 (×12): qty 2

## 2021-07-02 MED ORDER — SODIUM CHLORIDE 0.9 % IV BOLUS
500.0000 mL | Freq: Once | INTRAVENOUS | Status: AC
  Administered 2021-07-02: 500 mL via INTRAVENOUS

## 2021-07-02 MED ORDER — CYANOCOBALAMIN 1000 MCG/ML IJ SOLN
1000.0000 ug | Freq: Once | INTRAMUSCULAR | Status: AC
  Administered 2021-07-02: 1000 ug via INTRAMUSCULAR
  Filled 2021-07-02: qty 1

## 2021-07-02 MED ORDER — POLYSACCHARIDE IRON COMPLEX 150 MG PO CAPS
150.0000 mg | ORAL_CAPSULE | Freq: Every day | ORAL | Status: DC
  Administered 2021-07-02 – 2021-07-13 (×12): 150 mg via ORAL
  Filled 2021-07-02 (×13): qty 1

## 2021-07-02 MED ORDER — VITAMIN B-12 1000 MCG PO TABS
1000.0000 ug | ORAL_TABLET | Freq: Every day | ORAL | Status: DC
  Administered 2021-07-03 – 2021-07-13 (×11): 1000 ug via ORAL
  Filled 2021-07-02 (×11): qty 1

## 2021-07-02 NOTE — Progress Notes (Signed)
Orthopaedic Trauma Progress Note  SUBJECTIVE: Doing well this morning.  Nerve block still in full effect on right lower extremity.  Nerve block starting to wear off on the left but pain is very mild.  Has moved around some with nursing and done well with this. No chest pain. No SOB. No nausea/vomiting. No other complaints.   OBJECTIVE:  Vitals:   07/01/21 2149 07/02/21 0733  BP: (!) 112/56 (!) 95/45  Pulse: 69 (!) 46  Resp: 18 19  Temp: 98.4 F (36.9 C) 98.3 F (36.8 C)  SpO2: 100% 94%    General: Sitting up in bed, no acute distress Respiratory: No increased work of breathing.  LLE: Knee immobilizer in place.  Dressing clean, dry, intact.  Able to wiggle toes.  Endorses sensation light touch over the foot.  Foot warm and well-perfused. RLE: Cam boot in place.  Dressing clean, dry, intact.  Nerve block still in full effect.  Unable to wiggle toes and does not endorse sensation through the foot.  Foot is warm and well-perfused  IMAGING: Stable post op imaging.   LABS:  Results for orders placed or performed during the hospital encounter of 06/26/21 (from the past 24 hour(s))  CBC with Differential/Platelet     Status: Abnormal   Collection Time: 07/01/21  8:18 AM  Result Value Ref Range   WBC 8.9 4.0 - 10.5 K/uL   RBC 3.93 3.87 - 5.11 MIL/uL   Hemoglobin 12.0 12.0 - 15.0 g/dL   HCT 36.9 36.0 - 46.0 %   MCV 93.9 80.0 - 100.0 fL   MCH 30.5 26.0 - 34.0 pg   MCHC 32.5 30.0 - 36.0 g/dL   RDW 13.2 11.5 - 15.5 %   Platelets 337 150 - 400 K/uL   nRBC 0.0 0.0 - 0.2 %   Neutrophils Relative % 64 %   Neutro Abs 5.8 1.7 - 7.7 K/uL   Lymphocytes Relative 20 %   Lymphs Abs 1.7 0.7 - 4.0 K/uL   Monocytes Relative 12 %   Monocytes Absolute 1.1 (H) 0.1 - 1.0 K/uL   Eosinophils Relative 3 %   Eosinophils Absolute 0.2 0.0 - 0.5 K/uL   Basophils Relative 1 %   Basophils Absolute 0.1 0.0 - 0.1 K/uL   Immature Granulocytes 0 %   Abs Immature Granulocytes 0.03 0.00 - 0.07 K/uL   Comprehensive metabolic panel     Status: Abnormal   Collection Time: 07/01/21  8:18 AM  Result Value Ref Range   Sodium 138 135 - 145 mmol/L   Potassium 4.3 3.5 - 5.1 mmol/L   Chloride 102 98 - 111 mmol/L   CO2 28 22 - 32 mmol/L   Glucose, Bld 113 (H) 70 - 99 mg/dL   BUN 15 8 - 23 mg/dL   Creatinine, Ser 0.72 0.44 - 1.00 mg/dL   Calcium 9.1 8.9 - 10.3 mg/dL   Total Protein 6.7 6.5 - 8.1 g/dL   Albumin 3.5 3.5 - 5.0 g/dL   AST 22 15 - 41 U/L   ALT 20 0 - 44 U/L   Alkaline Phosphatase 40 38 - 126 U/L   Total Bilirubin 0.5 0.3 - 1.2 mg/dL   GFR, Estimated >60 >60 mL/min   Anion gap 8 5 - 15  Magnesium     Status: None   Collection Time: 07/01/21  8:18 AM  Result Value Ref Range   Magnesium 1.9 1.7 - 2.4 mg/dL  Phosphorus     Status: None   Collection  Time: 07/01/21  8:18 AM  Result Value Ref Range   Phosphorus 3.9 2.5 - 4.6 mg/dL  VITAMIN D 25 Hydroxy (Vit-D Deficiency, Fractures)     Status: Abnormal   Collection Time: 07/01/21  8:18 AM  Result Value Ref Range   Vit D, 25-Hydroxy 19.11 (L) 30 - 100 ng/mL  CBC with Differential/Platelet     Status: Abnormal   Collection Time: 07/02/21  2:41 AM  Result Value Ref Range   WBC 10.0 4.0 - 10.5 K/uL   RBC 3.08 (L) 3.87 - 5.11 MIL/uL   Hemoglobin 9.6 (L) 12.0 - 15.0 g/dL   HCT 29.0 (L) 36.0 - 46.0 %   MCV 94.2 80.0 - 100.0 fL   MCH 31.2 26.0 - 34.0 pg   MCHC 33.1 30.0 - 36.0 g/dL   RDW 13.3 11.5 - 15.5 %   Platelets 291 150 - 400 K/uL   nRBC 0.0 0.0 - 0.2 %   Neutrophils Relative % 88 %   Neutro Abs 8.8 (H) 1.7 - 7.7 K/uL   Lymphocytes Relative 5 %   Lymphs Abs 0.5 (L) 0.7 - 4.0 K/uL   Monocytes Relative 6 %   Monocytes Absolute 0.6 0.1 - 1.0 K/uL   Eosinophils Relative 0 %   Eosinophils Absolute 0.0 0.0 - 0.5 K/uL   Basophils Relative 0 %   Basophils Absolute 0.0 0.0 - 0.1 K/uL   Immature Granulocytes 1 %   Abs Immature Granulocytes 0.07 0.00 - 0.07 K/uL  Comprehensive metabolic panel     Status: Abnormal    Collection Time: 07/02/21  2:41 AM  Result Value Ref Range   Sodium 137 135 - 145 mmol/L   Potassium 4.4 3.5 - 5.1 mmol/L   Chloride 104 98 - 111 mmol/L   CO2 27 22 - 32 mmol/L   Glucose, Bld 155 (H) 70 - 99 mg/dL   BUN 14 8 - 23 mg/dL   Creatinine, Ser 0.67 0.44 - 1.00 mg/dL   Calcium 8.9 8.9 - 10.3 mg/dL   Total Protein 6.0 (L) 6.5 - 8.1 g/dL   Albumin 3.0 (L) 3.5 - 5.0 g/dL   AST 20 15 - 41 U/L   ALT 16 0 - 44 U/L   Alkaline Phosphatase 35 (L) 38 - 126 U/L   Total Bilirubin 0.4 0.3 - 1.2 mg/dL   GFR, Estimated >60 >60 mL/min   Anion gap 6 5 - 15  Magnesium     Status: None   Collection Time: 07/02/21  2:41 AM  Result Value Ref Range   Magnesium 2.0 1.7 - 2.4 mg/dL  Phosphorus     Status: None   Collection Time: 07/02/21  2:41 AM  Result Value Ref Range   Phosphorus 2.5 2.5 - 4.6 mg/dL  Vitamin B12     Status: Abnormal   Collection Time: 07/02/21  2:41 AM  Result Value Ref Range   Vitamin B-12 133 (L) 180 - 914 pg/mL  Folate     Status: None   Collection Time: 07/02/21  2:41 AM  Result Value Ref Range   Folate 8.5 >5.9 ng/mL  Iron and TIBC     Status: Abnormal   Collection Time: 07/02/21  2:41 AM  Result Value Ref Range   Iron 21 (L) 28 - 170 ug/dL   TIBC 256 250 - 450 ug/dL   Saturation Ratios 8 (L) 10.4 - 31.8 %   UIBC 235 ug/dL  Ferritin     Status: None   Collection Time:  07/02/21  2:41 AM  Result Value Ref Range   Ferritin 170 11 - 307 ng/mL  Reticulocytes     Status: Abnormal   Collection Time: 07/02/21  2:41 AM  Result Value Ref Range   Retic Ct Pct 1.8 0.4 - 3.1 %   RBC. 3.15 (L) 3.87 - 5.11 MIL/uL   Retic Count, Absolute 57.3 19.0 - 186.0 K/uL   Immature Retic Fract 14.6 2.3 - 15.9 %  Hemoglobin A1c     Status: Abnormal   Collection Time: 07/02/21  2:41 AM  Result Value Ref Range   Hgb A1c MFr Bld 5.8 (H) 4.8 - 5.6 %   Mean Plasma Glucose 119.76 mg/dL    ASSESSMENT: VERJEAN NEUGEBAUER is a 65 y.o. female, 1 Day Post-Op s/p Procedure(s): ORIF RIGHT  ANKLE FRACTURE ORIF LEFT PATELLA  CV/Blood loss: Acute blood loss anemia, Hgb 9.6 this AM. Slightly hypotensive.  PLAN: Weightbearing: NWB RLE, WBAT LLE with KI/hinge brace locked in full extension ROM: Ok for R ankle ROM as tolerated at rest Incisional and dressing care:  Plan to remove dressings tomorrow Showering: Hold off for now until dressings removed Orthopedic device(s): CAM boot RLE, Bledsoe brace LLE Pain management:  1. Tylenol 650 mg q 6 hours scheduled 2. Robaxin 500 mg q 6 hours PRN 3. Oxycodone 5-10 mg q 4 hours PRN 4. Neurontin 100 mg TID 5. Dilaudid 1 mg q 3 hours PRN VTE prophylaxis: Lovenox, SCDs ID:  Ancef 2gm post op Foley/Lines:  No foley, KVO IVFs Impediments to Fracture Healing: Vit D level 19, start on D3 supplementation. Recommend continuing this at discharge Dispo: PT/OT eval today, dispo pending. Plan to remove dressings tomorrow. Hinge brace has been ordered, likely to be delivered later today. Continue to monitor CBC    Follow - up plan: 2 weeks  Contact information:  Katha Hamming MD, Patrecia Pace PA-C. After hours and holidays please check [email or web address] for group call information for [facility]   Hulon Ferron A. Ricci Barker, PA-C [phone] (office) [email or web address]

## 2021-07-02 NOTE — Evaluation (Signed)
Occupational Therapy Evaluation Patient Details Name: Courtney Berg MRN: [id number] DOB: 10/06/1956 Today's Date: 07/02/2021    History of Present Illness The pt is a 65 year old female who presented to the ED 8/5 after fall downstairs at work, left knee x-ray showed patella fracture, right ankle and foot x-ray showed distal tibia and fibula fracture and dislocated ankle. Pt now s/p ORIF of R ankle and L patella 8/10. PMH includes diabetes, hypertension, cancer.   Clinical Impression   Pt admitted for concerns and procedure listed above. PTA pt reported that she was independent with all ADL's and IADL's, including working, driving, riding horses, and yard work. At this time, pt is mainly limited by weight bearing status and ROM limitations of LLE. Requiring max A +2 to sit<>stand, unable to pivot at this time. Pt also working on lateral scoots for transfers with drop arm chairs/BSC. Due to limitations, pt will benefit from intensive therapies prior to returning home, to maximize her independence and ease caregiver burden while recovering. OT will follow acutely.    Follow Up Recommendations  CIR    Equipment Recommendations   (May need drop arm BSC, TBD)    Recommendations for Other Services Rehab consult     Precautions / Restrictions Precautions Precautions: Fall Required Braces or Orthoses: Knee Immobilizer - Left;Other Brace Knee Immobilizer - Left: On when out of bed or walking Other Brace: CAM walker boot RLE Restrictions Weight Bearing Restrictions: Yes RLE Weight Bearing: Non weight bearing LLE Weight Bearing: Weight bearing as tolerated (with KI) Other Position/Activity Restrictions: pt is allowed gentle ROM to R ankle out of CAM boot when at rest with leg supported      Mobility Bed Mobility Overal bed mobility: Needs Assistance Bed Mobility: Supine to Sit     Supine to sit: Min assist     General bed mobility comments: increased time and effort, cues for  sequencing through transfer. Pt needing minA to LLE to advance OOB, but able to SLR RLE and move to EOB. modA to manage wt shift and scooting at EOB due to inability to use BLE    Transfers Overall transfer level: Needs assistance Equipment used: Rolling walker (2 wheeled) Transfers: Sit to/from Stand Sit to Stand: Max assist;+2 physical assistance;+2 safety/equipment;From elevated surface         General transfer comment: maxA of 2 to power up due to NWB RLE and LLE in KI. Pt able to generate good momentum and use of BUE on RW, but requries significant assist to power up due to limitations with BLE. recliner pulled behind pt as she was unable to generate pivoting steps or movement    Balance Overall balance assessment: Needs assistance;History of Falls Sitting-balance support: Feet unsupported Sitting balance-Leahy Scale: Good Sitting balance - Comments: pt leaning outside BOS with limited support of BLE with no LOB   Standing balance support: Bilateral upper extremity supported;During functional activity Standing balance-Leahy Scale: Poor Standing balance comment: pt reliant on BUE support to maintain balance with WB precautions                           ADL either performed or assessed with clinical judgement   ADL Overall ADL's : Needs assistance/impaired Eating/Feeding: Independent;Sitting   Grooming: Set up;Sitting   Upper Body Bathing: Min guard;Sitting   Lower Body Bathing: Maximal assistance;Sitting/lateral leans   Upper Body Dressing : Min guard;Sitting   Lower Body Dressing: Maximal assistance;+2 for safety/equipment;+2 for physical  assistance;Sitting/lateral leans;Sit to/from stand   Toilet Transfer: Maximal assistance;+2 for physical assistance;+2 for safety/equipment;Stand-pivot   Toileting- Clothing Manipulation and Hygiene: Maximal assistance;+2 for physical assistance;+2 for safety/equipment   Tub/ Shower Transfer: Maximal assistance;+2 for  physical assistance;+2 for safety/equipment;Stand-pivot   Functional mobility during ADLs: Maximal assistance;+2 for physical assistance;+2 for safety/equipment;Rolling walker General ADL Comments: Pt limited by weight bearing status and limited ROM. All LB ADL's and transfers/standing require max A +2 due to limited mobility.     Vision Baseline Vision/History: Wears glasses (contacts) Wears Glasses: Distance only Patient Visual Report: No change from baseline Vision Assessment?: No apparent visual deficits     Perception Perception Perception Tested?: No   Praxis Praxis Praxis tested?: Not tested    Pertinent Vitals/Pain Pain Assessment: Faces Faces Pain Scale: Hurts little more Pain Location: L knee with dependent position and wt bearing Pain Descriptors / Indicators: Aching;Sore Pain Intervention(s): Monitored during session;Repositioned;Ice applied     Hand Dominance Right   Extremity/Trunk Assessment Upper Extremity Assessment Upper Extremity Assessment: Overall WFL for tasks assessed   Lower Extremity Assessment Lower Extremity Assessment: RLE deficits/detail;LLE deficits/detail RLE Deficits / Details: good strength at hip and knee, lower leg NT due to NWB orders and CAM walker boot. Pt unable to wiggle toes or move R ankle RLE: Unable to fully assess due to immobilization RLE Sensation: decreased light touch (no sensation mid-calf down) RLE Coordination: decreased fine motor;decreased gross motor LLE Deficits / Details: pt must be kept in [place] for mobility, difficulty with hip ABD to move OOB. able to maintain standing with KI and no knee buckling. LLE: Unable to fully assess due to immobilization LLE Sensation: WNL   Cervical / Trunk Assessment Cervical / Trunk Assessment: Normal   Communication Communication Communication: No difficulties   Cognition Arousal/Alertness: Awake/alert Behavior During Therapy: WFL for tasks assessed/performed Overall Cognitive  Status: Within Functional Limits for tasks assessed                                 General Comments: pt pleasant and following all cues approrpiately   General Comments  VSS on RA, pt with complaints of KI rubbing back of LLE, keep watch on it    Exercises     Shoulder Instructions      Home Living Family/patient expects to be discharged to:: Private residence Living Arrangements: Spouse/significant other Available Help at Discharge: Family;Available 24 hours/day Type of Home: House Home Access: Stairs to enter;Ramped entrance Entrance Stairs-Number of Steps: 4 Entrance Stairs-Rails: Right;Left Home Layout: One level     Bathroom Shower/Tub: Corporate investment banker: Standard     Home Equipment: Bedside commode;Tub bench;Walker - 2 wheels;Wheelchair - manual;Grab bars - tub/shower;Crutches (WC with no foot rests)          Prior Functioning/Environment Level of Independence: Independent        Comments: Independent, working full time as Therapist, sports for [employer]. (can do from home), owns horses, still riding when able        OT Problem List: Decreased activity tolerance;Decreased range of motion;Impaired balance (sitting and/or standing);Decreased coordination;Decreased knowledge of use of DME or AE;Decreased knowledge of precautions;Pain      OT Treatment/Interventions: Self-care/ADL training;Therapeutic exercise;Energy conservation;DME and/or AE instruction;Therapeutic activities;Patient/family education;Balance training    OT Goals(Current goals can be found in the care plan section) Acute Rehab OT Goals Patient Stated Goal: return to independence OT Goal Formulation: With  patient Time For Goal Achievement: 07/16/21 Potential to Achieve Goals: Good ADL Goals Pt Will Perform Lower Body Bathing: with min assist;sitting/lateral leans;sit to/from stand Pt Will Perform Lower Body Dressing: with min assist;sitting/lateral  leans;sit to/from stand Pt Will Transfer to Toilet: with min assist;stand pivot transfer Pt Will Perform Toileting - Clothing Manipulation and hygiene: with min assist;sitting/lateral leans;sit to/from stand  OT Frequency: Min 2X/week   Barriers to D/C:            Co-evaluation PT/OT/SLP Co-Evaluation/Treatment: Yes Reason for Co-Treatment: For patient/therapist safety;To address functional/ADL transfers PT goals addressed during session: Mobility/safety with mobility;Balance;Strengthening/ROM OT goals addressed during session: ADL's and self-care;Strengthening/ROM      AM-PAC OT "6 Clicks" Daily Activity     Outcome Measure Help from another person eating meals?: None Help from another person taking care of personal grooming?: A Little Help from another person toileting, which includes using toliet, bedpan, or urinal?: A Lot Help from another person bathing (including washing, rinsing, drying)?: A Lot Help from another person to put on and taking off regular upper body clothing?: A Little Help from another person to put on and taking off regular lower body clothing?: A Lot 6 Click Score: 16   End of Session Equipment Utilized During Treatment: Gait belt;Rolling walker;Left knee immobilizer Nurse Communication: Mobility status;Weight bearing status;Other (comment) (Transfer options)  Activity Tolerance: Patient tolerated treatment well;Patient limited by pain Patient left: in chair;with call bell/phone within reach;with chair alarm set  OT Visit Diagnosis: Unsteadiness on feet (R26.81);Other abnormalities of gait and mobility (R26.89);Muscle weakness (generalized) (M62.81);Pain Pain - Right/Left: Left (Both) Pain - part of body: Leg;Ankle and joints of foot                Time: 0921-1003 OT Time Calculation (min): 42 min Charges:  OT General Charges $OT Visit: 1 Visit OT Evaluation $OT Eval Moderate Complexity: 1 Mod OT Treatments $Therapeutic Activity: 8-22 mins  Zhaire Locker  H., OTR/L Acute Rehabilitation  [place] Uplinger Elane Misk [place] 07/02/2021, 10:59 AM

## 2021-07-02 NOTE — Evaluation (Signed)
Physical Therapy Evaluation Patient Details Name: Courtney Berg MRN: [id number] DOB: [birth year] Today's Date: 07/02/2021   History of Present Illness  The pt is a 65 year old female who presented to the ED 8/5 after fall downstairs at work, left knee x-ray showed patella fracture, right ankle and foot x-ray showed distal tibia and fibula fracture and dislocated ankle. Pt now s/p ORIF of R ankle and L patella 8/10. PMH includes diabetes, hypertension, cancer.   Clinical Impression  Pt in bed upon arrival of PT, agreeable to evaluation at this time. Prior to admission the pt was completely independent with all mobility, working full time, and enjoyed horseback riding. The pt now presents with limitations in functional mobility, strength, power, endurance, and stability due to above dx, and will continue to benefit from skilled PT to address these deficits. The pt was able to come to sitting EOB with minA of 1 and use of bed rails/increased time this morning, but is significantly limited in ability to complete OOB transfers due to pain, weakness, and limits of WB precautions (described below). The pt required maxA of 2 to power up to standing from a significantly elevated surface, and was then unable to take any type of pivoting steps or movement. The pt will continue to benefit from skilled PT acutely to improve independence with OOB transfers, but would benefit from intensive therapies prior to return home where she would have great family support to maximize functional independence and safety with transfers.      Follow Up Recommendations CIR    Equipment Recommendations  Wheelchair (measurements PT);Wheelchair cushion (measurements PT) [facility] with elevating leg rests)    Recommendations for Other Services Rehab consult     Precautions / Restrictions Precautions Precautions: Fall Required Braces or Orthoses: Knee Immobilizer - Left;Other Brace Knee Immobilizer - Left: On when out of bed or  walking Other Brace: CAM walker boot RLE Restrictions Weight Bearing Restrictions: Yes RLE Weight Bearing: Non weight bearing LLE Weight Bearing: Weight bearing as tolerated (in [place]) Other Position/Activity Restrictions: pt is allowed gentle ROM to R ankle out of CAM boot when at rest with leg supported      Mobility  Bed Mobility Overal bed mobility: Needs Assistance Bed Mobility: Supine to Sit     Supine to sit: Min assist     General bed mobility comments: increased time and effort, cues for sequencing through transfer. Pt needing minA to LLE to advance OOB, but able to SLR RLE and move to EOB. modA to manage wt shift and scooting at EOB due to inability to use BLE    Transfers Overall transfer level: Needs assistance Equipment used: Rolling walker (2 wheeled) Transfers: Sit to/from Stand Sit to Stand: Max assist;+2 physical assistance;+2 safety/equipment;From elevated surface         General transfer comment: maxA of 2 to power up due to NWB RLE and LLE in KI. Pt able to generate good momentum and use of BUE on RW, but requries significant assist to power up due to limitations with BLE. recliner pulled behind pt as she was unable to generate pivoting steps or movement  Ambulation/Gait             General Gait Details: pt unable to generate even pivoting steps at this time      Balance Overall balance assessment: Needs assistance;History of Falls Sitting-balance support: Feet unsupported Sitting balance-Leahy Scale: Good Sitting balance - Comments: pt leaning outside BOS with limited support of BLE with no LOB  Standing balance support: Bilateral upper extremity supported;During functional activity Standing balance-Leahy Scale: Poor Standing balance comment: pt reliant on BUE support to maintain balance with WB precautions                             Pertinent Vitals/Pain Pain Assessment: Faces Faces Pain Scale: Hurts little more Pain Location:  L knee with dependent position and wt bearing Pain Descriptors / Indicators: Aching;Sore Pain Intervention(s): Monitored during session;Repositioned;Ice applied    Home Living Family/patient expects to be discharged to:: Private residence Living Arrangements: Spouse/significant other Available Help at Discharge: Family;Available 24 hours/day Type of Home: House Home Access: Stairs to enter;Ramped entrance Entrance Stairs-Rails: Right;Left Entrance Stairs-Number of Steps: 4 Home Layout: One level Home Equipment: Bedside commode;Tub bench;Walker - 2 wheels;Wheelchair - manual;Grab bars - tub/shower;Crutches (WC with no foot rests)      Prior Function Level of Independence: Independent         Comments: Independent, working full time as Therapist, sports for [employer]. (can do from home), owns horses, still riding when able     Hand Dominance   Dominant Hand: Right    Extremity/Trunk Assessment   Upper Extremity Assessment Upper Extremity Assessment: Defer to OT evaluation    Lower Extremity Assessment Lower Extremity Assessment: RLE deficits/detail;LLE deficits/detail RLE Deficits / Details: good strength at hip and knee, lower leg NT due to NWB orders and CAM walker boot. Pt unable to wiggle toes or move R ankle RLE: Unable to fully assess due to immobilization RLE Sensation: decreased light touch (no sensation mid-calf down) RLE Coordination: decreased fine motor;decreased gross motor LLE Deficits / Details: pt must be kept in [place] for mobility, difficulty with hip ABD to move OOB. able to maintain standing with KI and no knee buckling. LLE: Unable to fully assess due to immobilization LLE Sensation: WNL    Cervical / Trunk Assessment Cervical / Trunk Assessment: Normal  Communication   Communication: No difficulties  Cognition Arousal/Alertness: Awake/alert Behavior During Therapy: WFL for tasks assessed/performed Overall Cognitive Status: Within Functional Limits  for tasks assessed                                 General Comments: pt pleasant and following all cues approrpiately      General Comments General comments (skin integrity, edema, etc.): VSS on RA    Exercises     Assessment/Plan    PT Assessment Patient needs continued PT services  PT Problem List Decreased strength;Decreased range of motion;Decreased activity tolerance;Decreased balance;Decreased mobility;Pain       PT Treatment Interventions DME instruction;Gait training;Stair training;Functional mobility training;Therapeutic activities;Therapeutic exercise;Balance training    PT Goals (Current goals can be found in the Care Plan section)  Acute Rehab PT Goals Patient Stated Goal: return to independence PT Goal Formulation: With patient Time For Goal Achievement: 07/16/21 Potential to Achieve Goals: Good    Frequency Min 5X/week   Barriers to discharge        Co-evaluation PT/OT/SLP Co-Evaluation/Treatment: Yes Reason for Co-Treatment: For patient/therapist safety;To address functional/ADL transfers PT goals addressed during session: Mobility/safety with mobility;Balance;Strengthening/ROM         AM-PAC PT "6 Clicks" Mobility  Outcome Measure Help needed turning from your back to your side while in a flat bed without using bedrails?: A Little Help needed moving from lying on your back to sitting on the side  of a flat bed without using bedrails?: A Little Help needed moving to and from a bed to a chair (including a wheelchair)?: Total Help needed standing up from a chair using your arms (e.g., wheelchair or bedside chair)?: Total Help needed to walk in hospital room?: Total Help needed climbing 3-5 steps with a railing? : Total 6 Click Score: 10    End of Session Equipment Utilized During Treatment: Gait belt;Left knee immobilizer Activity Tolerance: Patient tolerated treatment well;Patient limited by fatigue;Patient limited by pain Patient  left: in chair;with call bell/phone within reach;with chair alarm set Nurse Communication: Mobility status;Weight bearing status PT Visit Diagnosis: Other abnormalities of gait and mobility (R26.89);Muscle weakness (generalized) (M62.81);Pain Pain - Right/Left: Left Pain - part of body: Knee    Time: 0921-1000 PT Time Calculation (min) (ACUTE ONLY): 39 min   Charges:   PT Evaluation $PT Eval Low Complexity: 1 Low PT Treatments $Therapeutic Activity: 8-22 mins        Inocencio Homes, PT, DPT   Acute Rehabilitation Department Pager #: [phone]  Sandra Cockayne 07/02/2021, 10:23 AM

## 2021-07-02 NOTE — Progress Notes (Signed)
PROGRESS NOTE    Courtney Berg  [id number] DOB: Jul 15, 1956 DOA: 06/26/2021 PCP: Susy Frizzle, MD   Brief Narrative:  The patient is a 65 year old obese Caucasian female with a past medical history significant for but not limited to history of allergies as well as other comorbidities who presented to ED after fall.  She fell downstairs at her work and she landed on her left knee and also noted to have significant right ankle pain and deformity.  Left knee showed patellar fracture and right ankle and foot x-ray showed distal tibia and fibular fracture as well as dislocated ankle.  She was admitted for her trimalleolar ankle fracture and left patellar fracture and orthopedic surgery was consulted.  Orthopedic surgery is planning on taking the patient for surgical intervention for ORIF of the Right Ankle and the Left Knee.  She will need PT OT to further evaluate and treat and this is still pending to be done.  Assessment & Plan:   Principal Problem:   Tibia/fibula fracture, right, closed, initial encounter Active Problems:   Allergies   Left patella fracture  Right Trimalleolar Ankle Fracture -Had a Mechanical Fall approximately down 3 stairs afer her heel slipped -DG Ankle showed "Interval reduction of fractures and talar dislocation." -CT Ankle showed "Complex comminuted spiral type fracture involving the distal fibular shaft at and above the level of the ankle mortise. Oblique coursing comminuted longitudinal intra-articular fracture involving the posterior malleolus of the tibia. Maximum posterior displacement is 7 mm. Maximum gap at the articular surface is 5 mm. Small impaction type fracture involving the medial and anterior aspect of the talar dome. Slight widening of the medial mortise and a few tiny bony densities near the talus. Possible deltoid ligament injury."  -C/w Pain Control per orthopedic surgery as delineated with Acetaminophen 325-650 mg every 6hprn Mild Pain for pain  1-3, Robaxin 500 mg po/IV every 6 as needed Muscle Spasms, Hydrocodone-Acetaminophen 5-325 mg (1-2 Tab) and 7.5-325 mg (1-2 Tab) every 4 as needed,  as well as Hydromorphone 0.5-1 mg every 4 as needed -Vitamin D level was 19.11 so she is will be started on vitamin D3 supplementation with Cholecalciferol 2,000 units po Daily   -Orthopedics planning ORIF and contacting Orthopedic Trauamtologists for definitive care  -Surgery on Wednesday 07/01/21 by Dr. Doreatha Martin and she is status post ORIF right ankle fracture -She has now been ordered for a right lower extremity CAM walker -Orthopedic recommending nonweightbearing of the right lower extremity but recommending okay for right ankle range of motion as tolerated at rest -We will need a bowel regimen -Orthopedic recommends plan to remove dressings tomorrow  Hypotension -Mild and Asymptomatic. Patient states normally her BP's run on the softer sider but haven't been this low -Given a bolus of 500 mL; Will C/w IVF with NS at 75 mL/hr -Last blood pressure reading was 95/45 this AM -Continue to Monitor BP per Protocol    Left Patella Fracture -DG Left Knee showed "There are changes consistent with a transverse fracture through the patella. There is distraction of the fracture fragments of at least 3 cm. Fat fluid effusion is noted between the fracture  fragments. Additionally on the frontal film there is fragmentation of the more superior fragment identified" -Left leg in knee immobilizer -Surgery today on Wednesday 07/01/21 and she underwent ORIF of the left patella -Orthopedic surgery recommending weightbearing as tolerated in the left lower extremity with AKI/hinged brace locked in full extension -Pain control as above   Acute  Urinary Retention -Bladder scan obtained the day before yesterday showed 800 cc urine -Likely induced by opioids -Continue in-and-out cath every 6 hours as needed -Improving and continue to Monitor -Up and Out of Bed    Hyperglycemia in setting of prediabetes -Patient's Blood Sugar on Admission was 151 on CMP and this a.m. it is 155; ranging from 113-155 daily BMP/CMP's -Continue to Monitor Blood Sugars per Protocol and if Necessary will place on Sensitive Novolog SSI AC -Check HbA1c is 5.8  Normocytic Anemia/IDA superimposed on Postoperative acute blood loss anemia -Patient's Hgb/Hct went from 11.5/34.7 -> 10.7/32.6 -> 11.3/34.3 -> 12.0/36.9 -> 9.6/29.0 and likely Post-Opertative Drop and Blood Loss along with some component of dilution  -Checked Anemia Panel and showed an iron level of 21, U IBC of 235, TIBC 256, saturation ratios of 8%, ferritin level 170, folate level 8.5, and vitamin B12 level of 133 -We will start supplementation Niferex 150 mg p.o. daily as well as given a 1000 mcg injection of cyanocobalamin today and start p.o. cyanocobalamin 1000 mcg tomorrow -Continue to Monitor for S/Sx of Bleeding; Currently no overt bleeding noted -Repeat CBC in the AM   Hyponatremia -Mild and now improved to 137 -Getting IVF as above with NS + 20 mEQ KCL at 75 mL/hr but will stop after 12 more hours  -Continue to Monitor and Trend -Repeat CMP in the AM   Obesity -Complicates overall prognosis and care -Estimated body mass index is 30.41 kg/m as calculated from the following:   Height as of this encounter: '5\' 8"'$  (1.727 m).   Weight as of this encounter: 90.7 kg. -Weight Loss and Dietary Counseling given   DVT prophylaxis: Enoxaparin 40 mg sq q24h; SCDs Code Status: FULL CODE Family Communication: No family present at bedside  Disposition Plan: Pending further clinical improvement and clearance by Orthopedic Surgery and evaluation by PT/OT   Status is: Inpatient  Remains inpatient appropriate because:Unsafe d/c plan, IV treatments appropriate due to intensity of illness or inability to take PO, and Inpatient level of care appropriate due to severity of illness  Dispo: The patient is from: Home               Anticipated d/c is to:  TBD              Patient currently is not medically stable to d/c.   Difficult to place patient No   Consultants:  Orthopedic Surgery General Surgery (Trauma Consult)  Procedures:  OPEN REDUCTION INTERNAL FIXATION (ORIF) ANKLE FRACTURE (Right) OPEN REDUCTION INTERNAL (ORIF) FIXATION PATELLA (Left)  Antimicrobials:  Anti-infectives (From admission, onward)    Start     Dose/Rate Route Frequency Ordered Stop   07/01/21 1615  ceFAZolin (ANCEF) IVPB 2g/100 mL premix        2 g 200 mL/hr over 30 Minutes Intravenous Every 8 hours 07/01/21 1518 07/02/21 0637   07/01/21 1305  vancomycin (VANCOCIN) powder  Status:  Discontinued          As needed 07/01/21 1317 07/01/21 1346   07/01/21 0800  ceFAZolin (ANCEF) IVPB 2g/100 mL premix  Status:  Discontinued        2 g 200 mL/hr over 30 Minutes Intravenous On call to O.R. 07/01/21 0701 07/01/21 1515       Subjective: Seen and examined at bedside and was doing ok this AM. Still awaiting breakfast. No CP or SOB. No lightheadedness or dizziness. Has some Pain but controlled with medication. States the feeling in her Right foot  hasn't come back yet. Plans to get out of bed with therapy today. No other concerns or complaints at this time.   Objective: Vitals:   07/01/21 1522 07/01/21 1725 07/01/21 2149 07/02/21 0733  BP: (!) 125/55 118/68 (!) 112/56 (!) 95/45  Pulse: 68 69 69 61  Resp: '12 12 18 19  '$ Temp: 97.8 F (36.6 C) 98 F (36.7 C) 98.4 F (36.9 C) 98.3 F (36.8 C)  TempSrc:  Oral Oral   SpO2:  98% 100% 94%  Weight:      Height:        Intake/Output Summary (Last 24 hours) at 07/02/2021 [id number] Last data filed at 07/01/2021 2137 Gross per 24 hour  Intake 2586.15 ml  Output 51 ml  Net 2535.15 ml    Filed Weights   07/01/21 0700 07/01/21 0933  Weight: 93.4 kg 90.7 kg   Examination: Physical Exam:  Constitutional: WN/WD obese Caucasian female in NAD and appears calm  Eyes: Lids and  conjunctivae normal, sclerae anicteric  ENMT: External Ears, Nose appear normal. Grossly normal hearing. Neck: Appears normal, supple, no cervical masses, normal ROM, no appreciable thyromegaly; no JVD Respiratory: Diminished to auscultation bilaterally, no wheezing, rales, rhonchi or crackles. Normal respiratory effort and patient is not tachypenic. No accessory muscle use. Unlabored breathing  Cardiovascular: RRR, no murmurs / rubs / gallops. S1 and S2 auscultated.  Abdomen: Soft, non-tender, Distended 2/2 body habitus. Bowel sounds positive.  GU: Deferred. Musculoskeletal: No clubbing / cyanosis of digits/nails. Right Leg and Aknle in a CAM boot and Left Knee in ACE wrap and Imobilizer  Skin: No rashes, lesions, ulcers on a limited skin evaluation. No induration; Warm and dry.  Neurologic: CN 2-12 grossly intact with no focal deficits. Romberg sign and cerebellar reflexes not assessed.  Psychiatric: Normal judgment and insight. Alert and oriented x 3. Normal mood and appropriate affect.   Data Reviewed: I have personally reviewed following labs and imaging studies  CBC: Recent Labs  Lab 06/26/21 1900 06/27/21 0222 06/28/21 0113 06/30/21 0305 07/01/21 0818 07/02/21 0241  WBC 14.6* 10.4 7.8 8.9 8.9 10.0  NEUTROABS 12.3*  --   --   --  5.8 8.8*  HGB 12.5 11.5* 10.7* 11.3* 12.0 9.6*  HCT 38.7 34.7* 32.6* 34.3* 36.9 29.0*  MCV 96.0 94.6 95.0 94.8 93.9 94.2  PLT 306 294 266 303 337 [id number]    Basic Metabolic Panel: Recent Labs  Lab 06/26/21 1900 06/27/21 0222 06/28/21 0113 07/01/21 0818 07/02/21 0241  NA 139 138 133* 138 137  K 3.5 4.2 3.9 4.3 4.4  CL 106 104 98 102 104  CO2 '24 27 29 28 27  '$ GLUCOSE 116* 151* 127* 113* 155*  BUN '11 13 12 15 14  '$ CREATININE 0.80 0.82 0.87 0.72 0.67  CALCIUM 8.9 8.7* 8.7* 9.1 8.9  MG  --   --   --  1.9 2.0  PHOS  --   --   --  3.9 2.5    GFR: Estimated Creatinine Clearance: 84.8 mL/min (by C-G formula based on SCr of 0.67 mg/dL). Liver  Function Tests: Recent Labs  Lab 06/26/21 1900 07/01/21 0818 07/02/21 0241  AST '22 22 20  '$ ALT '11 20 16  '$ ALKPHOS 42 40 35*  BILITOT 0.8 0.5 0.4  PROT 6.4* 6.7 6.0*  ALBUMIN 3.7 3.5 3.0*    No results for input(s): LIPASE, AMYLASE in the last 168 hours. No results for input(s): AMMONIA in the last 168 hours. Coagulation Profile: No results for input(s):  INR, PROTIME in the last 168 hours. Cardiac Enzymes: No results for input(s): CKTOTAL, CKMB, CKMBINDEX, TROPONINI in the last 168 hours. BNP (last 3 results) No results for input(s): PROBNP in the last 8760 hours. HbA1C: Recent Labs    07/02/21 0241  HGBA1C 5.8*   CBG: No results for input(s): GLUCAP in the last 168 hours. Lipid Profile: No results for input(s): CHOL, HDL, LDLCALC, TRIG, CHOLHDL, LDLDIRECT in the last 72 hours. Thyroid Function Tests: No results for input(s): TSH, T4TOTAL, FREET4, T3FREE, THYROIDAB in the last 72 hours. Anemia Panel: Recent Labs    07/02/21 0241  VITAMINB12 133*  FOLATE 8.5  FERRITIN 170  TIBC 256  IRON 21*  RETICCTPCT 1.8   Sepsis Labs: No results for input(s): PROCALCITON, LATICACIDVEN in the last 168 hours.  Recent Results (from the past 240 hour(s))  Resp Panel by RT-PCR (Flu A&B, Covid) Nasopharyngeal Swab     Status: None   Collection Time: 06/26/21  9:13 PM   Specimen: Nasopharyngeal Swab; Nasopharyngeal(NP) swabs in vial transport medium  Result Value Ref Range Status   SARS Coronavirus 2 by RT PCR NEGATIVE NEGATIVE Final    Comment: (NOTE) SARS-CoV-2 target nucleic acids are NOT DETECTED.  The SARS-CoV-2 RNA is generally detectable in upper respiratory specimens during the acute phase of infection. The lowest concentration of SARS-CoV-2 viral copies this assay can detect is 138 copies/mL. A negative result does not preclude SARS-Cov-2 infection and should not be used as the sole basis for treatment or other patient management decisions. A negative result may occur  with  improper specimen collection/handling, submission of specimen other than nasopharyngeal swab, presence of viral mutation(s) within the areas targeted by this assay, and inadequate number of viral copies(<138 copies/mL). A negative result must be combined with clinical observations, patient history, and epidemiological information. The expected result is Negative.  Fact Sheet for Patients:  EntrepreneurPulse.com.au  Fact Sheet for Healthcare Providers:  IncredibleEmployment.be  This test is no t yet approved or cleared by the [place] FDA and  has been authorized for detection and/or diagnosis of SARS-CoV-2 by FDA under an Emergency Use Authorization (EUA). This EUA will remain  in effect (meaning this test can be used) for the duration of the COVID-19 declaration under Section 564(b)(1) of the Act, 21 U.S.C.section 360bbb-3(b)(1), unless the authorization is terminated  or revoked sooner.       Influenza A by PCR NEGATIVE NEGATIVE Final   Influenza B by PCR NEGATIVE NEGATIVE Final    Comment: (NOTE) The Xpert Xpress SARS-CoV-2/FLU/RSV plus assay is intended as an aid in the diagnosis of influenza from Nasopharyngeal swab specimens and should not be used as a sole basis for treatment. Nasal washings and aspirates are unacceptable for Xpert Xpress SARS-CoV-2/FLU/RSV testing.  Fact Sheet for Patients: EntrepreneurPulse.com.au  Fact Sheet for Healthcare Providers: IncredibleEmployment.be  This test is not yet approved or cleared by the [place] FDA and has been authorized for detection and/or diagnosis of SARS-CoV-2 by FDA under an Emergency Use Authorization (EUA). This EUA will remain in effect (meaning this test can be used) for the duration of the COVID-19 declaration under Section 564(b)(1) of the Act, 21 U.S.C. section 360bbb-3(b)(1), unless the authorization is terminated  or revoked.  Performed at [facility], [place] [address] [place], [place] [address]   Surgical pcr screen     Status: None   Collection Time: 06/27/21  8:20 PM   Specimen: Nasal Mucosa; Nasal Swab  Result Value Ref  Range Status   MRSA, PCR NEGATIVE NEGATIVE Final   Staphylococcus aureus NEGATIVE NEGATIVE Final    Comment: (NOTE) The Xpert SA Assay (FDA approved for NASAL specimens in patients 27 years of age and older), is one component of a comprehensive surveillance program. It is not intended to diagnose infection nor to guide or monitor treatment. Performed at [facility], [place] [address] [place], [place] [address]     RN Pressure Injury Documentation:   Estimated body mass index is 30.41 kg/m as calculated from the following:   Height as of this encounter: '5\' 8"'$  (1.727 m).   Weight as of this encounter: 90.7 kg.  Malnutrition Type:   Malnutrition Characteristics:   Nutrition Interventions:     Radiology Studies: DG Knee 1-2 Views Left  Result Date: 07/01/2021 CLINICAL DATA:  Two heart surgery. ORIF right ankle and ORIF left patella. EXAM: DG C-ARM 1-60 MIN; RIGHT ANKLE - COMPLETE 3+ VIEW; LEFT KNEE - 1-2 VIEW FLUOROSCOPY TIME:  Fluoroscopy Time:  1 minute, 4 seconds Radiation Exposure Index (if provided by the fluoroscopic device): 1.84 mGy. Number of Acquired Spot Images: 21 COMPARISON:  06/26/2021. FINDINGS: Twenty-one total C-arm fluoroscopic images were obtained intraoperatively and submitted for post operative interpretation. The ankle fluoroscopy demonstrates plate screw fixation distal fibula, single screw fixation through the distal tibia, and tight rope syndesmotic repair. Improved alignment, near anatomic. The patellar fluoroscopy demonstrates plate and screw fixation of the patellar fracture with improved, near anatomic alignment. No unexpected findings. Expected soft tissue swelling and gas. Please see the performing provider's procedural report for  further detail. IMPRESSION: Intraoperative fluoroscopy during ankle and patellar surgery. Electronically Signed   By: Margaretha Sheffield MD   On: 07/01/2021 14:33   DG Ankle Complete Right  Result Date: 07/01/2021 CLINICAL DATA:  Two heart surgery. ORIF right ankle and ORIF left patella. EXAM: DG C-ARM 1-60 MIN; RIGHT ANKLE - COMPLETE 3+ VIEW; LEFT KNEE - 1-2 VIEW FLUOROSCOPY TIME:  Fluoroscopy Time:  1 minute, 4 seconds Radiation Exposure Index (if provided by the fluoroscopic device): 1.84 mGy. Number of Acquired Spot Images: 21 COMPARISON:  06/26/2021. FINDINGS: Twenty-one total C-arm fluoroscopic images were obtained intraoperatively and submitted for post operative interpretation. The ankle fluoroscopy demonstrates plate screw fixation distal fibula, single screw fixation through the distal tibia, and tight rope syndesmotic repair. Improved alignment, near anatomic. The patellar fluoroscopy demonstrates plate and screw fixation of the patellar fracture with improved, near anatomic alignment. No unexpected findings. Expected soft tissue swelling and gas. Please see the performing provider's procedural report for further detail. IMPRESSION: Intraoperative fluoroscopy during ankle and patellar surgery. Electronically Signed   By: Margaretha Sheffield MD   On: 07/01/2021 14:33   DG Knee Left Port  Result Date: 07/01/2021 CLINICAL DATA:  Status post ORIF left patella EXAM: PORTABLE LEFT KNEE - 1-2 VIEW COMPARISON:  June 26, 2021 FINDINGS: Interval plate and screw fixation of the comminuted patellar fracture which appears in anatomic alignment. Postsurgical changes evident with a joint effusion and subcutaneous edema/gas. IMPRESSION: Postsurgical change of ORIF of comminuted patellar fracture. Electronically Signed   By: Dahlia Bailiff MD   On: 07/01/2021 16:36   DG Ankle Right Port  Result Date: 07/01/2021 CLINICAL DATA:  Status post ORIF EXAM: PORTABLE RIGHT ANKLE - 2 VIEW COMPARISON:  06/26/2021 FINDINGS:  Interval surgical plate and multiple screw fixation of the distal fibula across comminuted distal fibular fracture. Fixating screw at the distal tibia with cortical anchor medially at the  distal tibia. Near anatomic alignment. Ankle mortise symmetric. IMPRESSION: Interval surgical fixation of distal tibial and fibular fractures Electronically Signed   By: Donavan Foil M.D.   On: 07/01/2021 16:36   DG C-Arm 1-60 Min  Result Date: 07/01/2021 CLINICAL DATA:  Two heart surgery. ORIF right ankle and ORIF left patella. EXAM: DG C-ARM 1-60 MIN; RIGHT ANKLE - COMPLETE 3+ VIEW; LEFT KNEE - 1-2 VIEW FLUOROSCOPY TIME:  Fluoroscopy Time:  1 minute, 4 seconds Radiation Exposure Index (if provided by the fluoroscopic device): 1.84 mGy. Number of Acquired Spot Images: 21 COMPARISON:  06/26/2021. FINDINGS: Twenty-one total C-arm fluoroscopic images were obtained intraoperatively and submitted for post operative interpretation. The ankle fluoroscopy demonstrates plate screw fixation distal fibula, single screw fixation through the distal tibia, and tight rope syndesmotic repair. Improved alignment, near anatomic. The patellar fluoroscopy demonstrates plate and screw fixation of the patellar fracture with improved, near anatomic alignment. No unexpected findings. Expected soft tissue swelling and gas. Please see the performing provider's procedural report for further detail. IMPRESSION: Intraoperative fluoroscopy during ankle and patellar surgery. Electronically Signed   By: Margaretha Sheffield MD   On: 07/01/2021 14:33    Scheduled Meds:  cholecalciferol  2,000 Units Oral Daily   cyanocobalamin  1,000 mcg Intramuscular Once   docusate sodium  100 mg Oral BID   enoxaparin (LOVENOX) injection  40 mg Subcutaneous Q24H   iron polysaccharides  150 mg Oral Daily   [START ON 07/03/2021] vitamin B-12  1,000 mcg Oral Daily   Continuous Infusions:  0.9 % NaCl with KCl 20 mEq / L 75 mL/hr at 07/02/21 0756   methocarbamol  (ROBAXIN) IV     sodium chloride      LOS: 5 days   Kerney Elbe, DO [facility] PAGER is on AMION  If 7PM-7AM, please contact night-coverage [email or web address]

## 2021-07-02 NOTE — Plan of Care (Signed)
?  Problem: Education: ?Goal: Knowledge of the prescribed therapeutic regimen will improve ?Outcome: Progressing ?  ?Problem: Activity: ?Goal: Ability to increase mobility will improve ?Outcome: Progressing ?  ?Problem: Pain Management: ?Goal: Pain level will decrease with appropriate interventions ?Outcome: Progressing ?  ?Problem: Skin Integrity: ?Goal: Will show signs of wound healing ?Outcome: Progressing ?  ?

## 2021-07-02 NOTE — Progress Notes (Signed)
Inpatient Rehab Admissions Coordinator Note:   Per therapy recommendations, pt was screened for CIR candidacy by Shann Medal, PT, DPT.  At this time we are recommending a CIR consult and I will place an order per our protocol.    Of note, it appears pt may have been injured at work and may be eligible for worker's compensation.  Please contact me with questions.   Shann Medal, PT, DPT [phone] 07/02/21 12:03 PM

## 2021-07-02 NOTE — TOC Progression Note (Signed)
Transition of Care [facility]) - Progression Note    Patient Details  Name: Courtney Berg MRN: [id number] Date of Birth: 18-Dec-1955  Transition of Care [facility]) CM/SW Contact  Milinda Antis, [place] Phone Number: 07/02/2021, 8:46 AM  Clinical Narrative:     TOC following patient for any d/c planning needs once medically stable.  Pending PT/ OT recommendations.  Lind Covert, MSW, LCSWA        Expected Discharge Plan and Services                                                 Social Determinants of Health (SDOH) Interventions    Readmission Risk Interventions No flowsheet data found.

## 2021-07-03 DIAGNOSIS — D62 Acute posthemorrhagic anemia: Secondary | ICD-10-CM | POA: Diagnosis not present

## 2021-07-03 DIAGNOSIS — D72829 Elevated white blood cell count, unspecified: Secondary | ICD-10-CM

## 2021-07-03 DIAGNOSIS — S82201A Unspecified fracture of shaft of right tibia, initial encounter for closed fracture: Secondary | ICD-10-CM | POA: Diagnosis not present

## 2021-07-03 DIAGNOSIS — T7840XA Allergy, unspecified, initial encounter: Secondary | ICD-10-CM | POA: Diagnosis not present

## 2021-07-03 DIAGNOSIS — S82002A Unspecified fracture of left patella, initial encounter for closed fracture: Secondary | ICD-10-CM | POA: Diagnosis not present

## 2021-07-03 LAB — CBC WITH DIFFERENTIAL/PLATELET
Abs Immature Granulocytes: 0.05 10*3/uL (ref 0.00–0.07)
Basophils Absolute: 0 10*3/uL (ref 0.0–0.1)
Basophils Relative: 0 %
Eosinophils Absolute: 0 10*3/uL (ref 0.0–0.5)
Eosinophils Relative: 0 %
HCT: 28.4 % — ABNORMAL LOW (ref 36.0–46.0)
Hemoglobin: 9.5 g/dL — ABNORMAL LOW (ref 12.0–15.0)
Immature Granulocytes: 0 %
Lymphocytes Relative: 13 %
Lymphs Abs: 1.6 10*3/uL (ref 0.7–4.0)
MCH: 31.8 pg (ref 26.0–34.0)
MCHC: 33.5 g/dL (ref 30.0–36.0)
MCV: 95 fL (ref 80.0–100.0)
Monocytes Absolute: 1.2 10*3/uL — ABNORMAL HIGH (ref 0.1–1.0)
Monocytes Relative: 10 %
Neutro Abs: 9.5 10*3/uL — ABNORMAL HIGH (ref 1.7–7.7)
Neutrophils Relative %: 77 %
Platelets: 319 10*3/uL (ref 150–400)
RBC: 2.99 MIL/uL — ABNORMAL LOW (ref 3.87–5.11)
RDW: 13.7 % (ref 11.5–15.5)
WBC: 12.4 10*3/uL — ABNORMAL HIGH (ref 4.0–10.5)
nRBC: 0 % (ref 0.0–0.2)

## 2021-07-03 LAB — COMPREHENSIVE METABOLIC PANEL
ALT: 14 U/L (ref 0–44)
AST: 18 U/L (ref 15–41)
Albumin: 2.8 g/dL — ABNORMAL LOW (ref 3.5–5.0)
Alkaline Phosphatase: 40 U/L (ref 38–126)
Anion gap: 7 (ref 5–15)
BUN: 14 mg/dL (ref 8–23)
CO2: 26 mmol/L (ref 22–32)
Calcium: 8.4 mg/dL — ABNORMAL LOW (ref 8.9–10.3)
Chloride: 107 mmol/L (ref 98–111)
Creatinine, Ser: 0.79 mg/dL (ref 0.44–1.00)
GFR, Estimated: >60 mL/min (ref >60)
Glucose, Bld: 107 mg/dL — ABNORMAL HIGH (ref 70–99)
Potassium: 3.8 mmol/L (ref 3.5–5.1)
Sodium: 140 mmol/L (ref 135–145)
Total Bilirubin: 0.3 mg/dL (ref 0.3–1.2)
Total Protein: 5.6 g/dL — ABNORMAL LOW (ref 6.5–8.1)

## 2021-07-03 LAB — MAGNESIUM: Magnesium: 1.8 mg/dL (ref 1.7–2.4)

## 2021-07-03 LAB — PHOSPHORUS: Phosphorus: 3 mg/dL (ref 2.5–4.6)

## 2021-07-03 MED ORDER — MAGNESIUM SULFATE 2 GM/50ML IV SOLN
2.0000 g | Freq: Once | INTRAVENOUS | Status: AC
  Administered 2021-07-03: 2 g via INTRAVENOUS
  Filled 2021-07-03: qty 50

## 2021-07-03 NOTE — Progress Notes (Signed)
Physical Therapy Treatment Patient Details Name: Courtney Berg MRN: [id number] DOB: Feb 09, 1956 Today's Date: 07/03/2021    History of Present Illness The pt is a 65 year old female who presented to the ED 8/5 after fall downstairs at work, left knee x-ray showed patella fracture, right ankle and foot x-ray showed distal tibia and fibula fracture and dislocated ankle. Pt now s/p ORIF of R ankle and L patella 8/10. PMH includes diabetes, hypertension, cancer.    PT Comments    Patient received in bed, reports her L knee is hurting, her stomach is hurting, but she is agreeable to PT session. She was able to move from supine to sit at edge of bed with min assist for L LE, increased time and effort. Sit to stand requires +2 max assist from very elevated bed due to KI on left, NWB on right. She has difficulty remaining NWB on right with standing and requires cues/assist for positioning of RW to maintain balance in standing. Patient was able to pivot this day with max assist of 2 to recliner. She will continue to benefit from skilled PT while here to improve functional mobility, safety and independence.       Follow Up Recommendations  CIR     Equipment Recommendations  Wheelchair (measurements PT);Wheelchair cushion (measurements PT)    Recommendations for Other Services Rehab consult     Precautions / Restrictions Precautions Precautions: Fall Required Braces or Orthoses: Knee Immobilizer - Left;Other Brace Knee Immobilizer - Left: On when out of bed or walking Other Brace: CAM walker boot RLE Restrictions Weight Bearing Restrictions: Yes RLE Weight Bearing: Non weight bearing LLE Weight Bearing: Weight bearing as tolerated Other Position/Activity Restrictions: pt is allowed gentle ROM to R ankle out of CAM boot when at rest with leg supported    Mobility  Bed Mobility Overal bed mobility: Needs Assistance Bed Mobility: Supine to Sit     Supine to sit: Min assist     General  bed mobility comments: Min assist with L LE to bring off bed and support    Transfers Overall transfer level: Needs assistance Equipment used: Rolling walker (2 wheeled) Transfers: Sit to/from [facility] Sit to Stand: Max assist;+2 physical assistance;+2 safety/equipment;From elevated surface Stand pivot transfers: Max assist;+2 physical assistance;+2 safety/equipment       General transfer comment: Pt able to complete pivot transfer this session with Max A +2 assist  Ambulation/Gait             General Gait Details: patient is able to pivot but not able to hop   Stairs             Wheelchair Mobility    Modified Rankin (Stroke Patients Only)       Balance Overall balance assessment: Needs assistance;History of Falls Sitting-balance support: Feet supported;Bilateral upper extremity supported Sitting balance-Leahy Scale: Good Sitting balance - Comments: pt leaning outside BOS with limited support of BLE with no LOB   Standing balance support: Bilateral upper extremity supported;During functional activity Standing balance-Leahy Scale: Poor Standing balance comment: pt reliant on BUE support to maintain balance with WB precautions, requires mod +2 for standing balance                            Cognition Arousal/Alertness: Awake/alert Behavior During Therapy: WFL for tasks assessed/performed Overall Cognitive Status: Within Functional Limits for tasks assessed  General Comments: pt pleasant and following all cues approrpiately      Exercises Other Exercises Other Exercises: chair pushups x5 Other Exercises: ankle pumps x5    General Comments General comments (skin integrity, edema, etc.): VSS on RA, bandages removed from L knee      Pertinent Vitals/Pain Pain Assessment: Faces Faces Pain Scale: Hurts even more Pain Location: L knee with dependent position and wt bearing Pain  Descriptors / Indicators: Discomfort;Sore;Grimacing;Guarding Pain Intervention(s): Monitored during session;Repositioned;Limited activity within patient's tolerance    Home Living                      Prior Function            PT Goals (current goals can now be found in the care plan section) Acute Rehab PT Goals Patient Stated Goal: return to independence PT Goal Formulation: With patient Time For Goal Achievement: 07/16/21 Potential to Achieve Goals: Good Progress towards PT goals: Progressing toward goals    Frequency    Min 5X/week      PT Plan Current plan remains appropriate    Co-evaluation PT/OT/SLP Co-Evaluation/Treatment: Yes Reason for Co-Treatment: For patient/therapist safety;To address functional/ADL transfers;Necessary to address cognition/behavior during functional activity PT goals addressed during session: Mobility/safety with mobility;Balance;Proper use of DME OT goals addressed during session: ADL's and self-care;Strengthening/ROM;Proper use of Adaptive equipment and DME      AM-PAC PT "6 Clicks" Mobility   Outcome Measure  Help needed turning from your back to your side while in a flat bed without using bedrails?: A Little Help needed moving from lying on your back to sitting on the side of a flat bed without using bedrails?: A Little Help needed moving to and from a bed to a chair (including a wheelchair)?: A Lot Help needed standing up from a chair using your arms (e.g., wheelchair or bedside chair)?: Total Help needed to walk in hospital room?: Total Help needed climbing 3-5 steps with a railing? : Total 6 Click Score: 11    End of Session Equipment Utilized During Treatment: Gait belt;Left knee immobilizer Activity Tolerance: Patient limited by fatigue;Patient limited by pain Patient left: in chair;with call bell/phone within reach;with chair alarm set Nurse Communication: Mobility status PT Visit Diagnosis: Other abnormalities of  gait and mobility (R26.89);Muscle weakness (generalized) (M62.81);Pain;Unsteadiness on feet (R26.81) Pain - Right/Left: Left Pain - part of body: Knee     Time: [id number] PT Time Calculation (min) (ACUTE ONLY): 23 min  Charges:  $Therapeutic Activity: 8-22 mins                    [employer], PT, [facility] 07/03/21,3:05 PM

## 2021-07-03 NOTE — Progress Notes (Signed)
Orthopaedic Trauma Progress Note  SUBJECTIVE: Doing well this morning.  Nerve blocks have fully worn off. Pain currently controlled, recently received pain medication. No chest pain. No SOB. No nausea/vomiting. No other complaints. Patient notes her husband has been working on home modifications/accommodations for when she returns home. Notes she has a walker and elevated toilet seat at home already but will need a wheelchair at time of discharge.  OBJECTIVE:  Vitals:   07/03/21 0600 07/03/21 0818  BP: 125/60 131/63  Pulse: 64 67  Resp: 18 18  Temp: 98.5 F (36.9 C) 98.3 F (36.8 C)  SpO2: 96% 98%    General: Sitting up in bed, no acute distress Respiratory: No increased work of breathing.  LLE: Knee immobilizer in place.  Dressing removed, incision clean, dry, intact.  Able to wiggle toes.  Endorses sensation to light touch over the foot.  Foot warm and well-perfused. RLE: Cam boot in place.  Dressing clean, dry, intact.  Left in place per patient's request. Able to wiggle toes. Endorses sensation to light touch over foot. Foot is warm and well-perfused  IMAGING: Stable post op imaging.   LABS:  Results for orders placed or performed during the hospital encounter of 06/26/21 (from the past 24 hour(s))  CBC with Differential/Platelet     Status: Abnormal   Collection Time: 07/03/21  3:35 AM  Result Value Ref Range   WBC 12.4 (H) 4.0 - 10.5 K/uL   RBC 2.99 (L) 3.87 - 5.11 MIL/uL   Hemoglobin 9.5 (L) 12.0 - 15.0 g/dL   HCT 28.4 (L) 36.0 - 46.0 %   MCV 95.0 80.0 - 100.0 fL   MCH 31.8 26.0 - 34.0 pg   MCHC 33.5 30.0 - 36.0 g/dL   RDW 13.7 11.5 - 15.5 %   Platelets 319 150 - 400 K/uL   nRBC 0.0 0.0 - 0.2 %   Neutrophils Relative % 77 %   Neutro Abs 9.5 (H) 1.7 - 7.7 K/uL   Lymphocytes Relative 13 %   Lymphs Abs 1.6 0.7 - 4.0 K/uL   Monocytes Relative 10 %   Monocytes Absolute 1.2 (H) 0.1 - 1.0 K/uL   Eosinophils Relative 0 %   Eosinophils Absolute 0.0 0.0 - 0.5 K/uL    Basophils Relative 0 %   Basophils Absolute 0.0 0.0 - 0.1 K/uL   Immature Granulocytes 0 %   Abs Immature Granulocytes 0.05 0.00 - 0.07 K/uL  Comprehensive metabolic panel     Status: Abnormal   Collection Time: 07/03/21  3:35 AM  Result Value Ref Range   Sodium 140 135 - 145 mmol/L   Potassium 3.8 3.5 - 5.1 mmol/L   Chloride 107 98 - 111 mmol/L   CO2 26 22 - 32 mmol/L   Glucose, Bld 107 (H) 70 - 99 mg/dL   BUN 14 8 - 23 mg/dL   Creatinine, Ser 0.79 0.44 - 1.00 mg/dL   Calcium 8.4 (L) 8.9 - 10.3 mg/dL   Total Protein 5.6 (L) 6.5 - 8.1 g/dL   Albumin 2.8 (L) 3.5 - 5.0 g/dL   AST 18 15 - 41 U/L   ALT 14 0 - 44 U/L   Alkaline Phosphatase 40 38 - 126 U/L   Total Bilirubin 0.3 0.3 - 1.2 mg/dL   GFR, Estimated >60 >60 mL/min   Anion gap 7 5 - 15  Phosphorus     Status: None   Collection Time: 07/03/21  3:35 AM  Result Value Ref Range   Phosphorus  3.0 2.5 - 4.6 mg/dL  Magnesium     Status: None   Collection Time: 07/03/21  3:35 AM  Result Value Ref Range   Magnesium 1.8 1.7 - 2.4 mg/dL    ASSESSMENT: Courtney Berg is a 65 y.o. female, 2 Days Post-Op s/p Procedure(s): ORIF RIGHT ANKLE FRACTURE ORIF LEFT PATELLA  CV/Blood loss: Acute blood loss anemia, Hgb 9.5 this AM. Stable   PLAN: Weightbearing: NWB RLE, WBAT LLE with KI/hinge brace locked in full extension ROM: Ok for R ankle ROM as tolerated at rest Incisional and dressing care:  Change dressings PRN. Ok to leave open to air Showering: ok to shower with assistance. Incisions may get wet Orthopedic device(s): CAM boot RLE, Bledsoe brace LLE Pain management:  1. Tylenol 650 mg q 6 hours scheduled 2. Robaxin 500 mg q 6 hours PRN 3. Oxycodone 5-10 mg q 4 hours PRN 4. Neurontin 100 mg TID 5. Dilaudid 1 mg q 3 hours PRN VTE prophylaxis: Lovenox, SCDs ID:  Ancef 2gm post op completed Foley/Lines:  No foley, KVO IVFs Impediments to Fracture Healing: Vit D level 19, continue on D3 supplementation. Recommend continuing this  at discharge Dispo: Therapies as tolerated, PT/OT recommending CIR. Consult for this has been placed.  Hinge knee brace has been ordered, likely to be delivered later today.  Follow - up plan: 2 weeks  Contact information:  Katha Hamming MD, Patrecia Pace PA-C. After hours and holidays please check [email or web address] for group call information for [facility]   Merland Holness A. Ricci Barker, PA-C [phone] (office) [email or web address]

## 2021-07-03 NOTE — Progress Notes (Signed)
Orthopedic Tech Progress Note Patient Details:  Courtney Berg [birth year] [id number] Hinged knee brace has been ordered from [employer]  Patient ID: [name], female   DOB: 1956-03-23, 65 y.o.   MRN: [id number]  Linus Salmons Cyle Kenyon 07/03/2021, 9:15 AM

## 2021-07-03 NOTE — Progress Notes (Signed)
Inpatient Rehab Admissions Coordinator:   Met with patient and her spouse at the bedside.  Currently they are leaning towards discharge home with 24/7 support and home health.  I reviewed goals/expectations for CIR to include ELOS about 2 weeks (dependent upon progress) and 3 hr/day of therapy with goal of improving independence.  They would like to think about it over the weekend so I will f/u on Monday.   Shann Medal, PT, DPT Admissions Coordinator [phone] 07/03/21  12:22 PM

## 2021-07-03 NOTE — Progress Notes (Addendum)
PROGRESS NOTE    Courtney HEADEN  [id number] DOB: 1956-09-29 DOA: 06/26/2021 PCP: Susy Frizzle, MD   Brief Narrative:  The patient is a 65 year old obese Caucasian female with a past medical history significant for but not limited to history of allergies as well as other comorbidities who presented to ED after fall.  She fell downstairs at her work and she landed on her left knee and also noted to have significant right ankle pain and deformity.  Left knee showed patellar fracture and right ankle and foot x-ray showed distal tibia and fibular fracture as well as dislocated ankle.  She was admitted for her trimalleolar ankle fracture and left patellar fracture and orthopedic surgery was consulted.  Orthopedic surgery is planning on taking the patient for surgical intervention for ORIF of the Right Ankle and the Left Knee.  PT and OT evaluated and recommending Inpatient Rehab but patient evaluating options of possibly going home with Home Health if she does not elect to go to CIR.   Assessment & Plan:   Principal Problem:   Tibia/fibula fracture, right, closed, initial encounter Active Problems:   Allergies   Left patella fracture  Right Trimalleolar Ankle Fracture s/p ORIF on 07/01/21 -Had a Mechanical Fall approximately down 3 stairs afer her heel slipped -DG Ankle showed "Interval reduction of fractures and talar dislocation." -CT Ankle showed "Complex comminuted spiral type fracture involving the distal fibular shaft at and above the level of the ankle mortise. Oblique coursing comminuted longitudinal intra-articular fracture involving the posterior malleolus of the tibia. Maximum posterior displacement is 7 mm. Maximum gap at the articular surface is 5 mm. Small impaction type fracture involving the medial and anterior aspect of the talar dome. Slight widening of the medial mortise and a few tiny bony densities near the talus. Possible deltoid ligament injury."  -C/w Pain Control per  orthopedic surgery as delineated with Acetaminophen 325-650 mg every 6hprn Mild Pain for pain 1-3, Robaxin 500 mg po/IV every 6 as needed Muscle Spasms, Hydrocodone-Acetaminophen 5-325 mg (1-2 Tab) and 7.5-325 mg (1-2 Tab) every 4 as needed,  as well as Hydromorphone 0.5-1 mg every 4 as needed -Vitamin D level was 19.11 so she is will be started on vitamin D3 supplementation with Cholecalciferol 2,000 units po Daily   -Orthopedics planning ORIF and contacting Orthopedic Trauamtologists for definitive care  -Surgery on Wednesday 07/01/21 by Dr. Doreatha Martin and she is status post ORIF right ankle fracture -She has now been ordered for a right lower extremity CAM walker -Orthopedic recommending nonweightbearing of the right lower extremity but recommending okay for right ankle range of motion as tolerated at rest -We will need a bowel regimen -Orthopedic removed dressings today and letting air out today   Hypotension -Mild and Asymptomatic. Patient states normally her BP's run on the softer sider but haven't been this low -Given a bolus of 500 mL on 07/02/21; Will Now Discontinue IVF with NS at 75 mL/hr + 20 mEQ KCl -Last blood pressure reading was 131/63 this AM -Continue to Monitor BP per Protocol    Left Patella Fracture s/p ORIF 07/01/21 -DG Left Knee showed "There are changes consistent with a transverse fracture through the patella. There is distraction of the fracture fragments of at least 3 cm. Fat fluid effusion is noted between the fracture  fragments. Additionally on the frontal film there is fragmentation of the more superior fragment identified" -Left leg in knee immobilizer -Surgery today on Wednesday 07/01/21 and she underwent ORIF of  the left patella -Orthopedic surgery recommending weightbearing as tolerated in the left lower extremity with AKI/hinged brace locked in full extension which has still yet to be delivered but has been ordered  -Pain control as above   Acute Urinary  Retention -Bladder scan obtained the day before yesterday showed 800 cc urine -Likely induced by opioids -Continue in-and-out cath every 6 hours as needed -Improving and continue to Monitor -Up and Out of Bed   Leukocytosis -Likely Reactive in the setting of Pain and Surgery -WBC went from 7.8 -> 8.9 -> 8.9 -> 10.0 -> 12.4 -Patient is afebrile -Continue to Monitor for S/Sx of Infection -Repeat CBC in the AM   Hyperglycemia in setting of Prediabetes -Patient's Blood Sugar on Admission was 151 on CMP and this a.m. it is 107; ranging from 113-155 daily BMP/CMP's -Continue to Monitor Blood Sugars per Protocol and if Necessary will place on Sensitive Novolog SSI AC -Check HbA1c is 5.8  Normocytic Anemia/IDA superimposed on Postoperative acute blood loss anemia -Patient's Hgb/Hct went from 11.5/34.7 -> 10.7/32.6 -> 11.3/34.3 -> 12.0/36.9 -> 9.6/29.0 -> 9.5/28.4 and likely Post-Opertative Drop and Blood Loss along with some component of dilution  -Checked Anemia Panel and showed an iron level of 21, U IBC of 235, TIBC 256, saturation ratios of 8%, ferritin level 170, folate level 8.5, and vitamin B12 level of 133 -We will start supplementation Niferex 150 mg p.o. daily as well as given a 1000 mcg injection of cyanocobalamin yesterday and start p.o. cyanocobalamin 1000 mcg today  -Continue to Monitor for S/Sx of Bleeding; Currently no overt bleeding noted -Repeat CBC in the AM   Hyponatremia -Mild at 133 and now improved to 140 -IVF with NS + 20 mEQ KCL at 75 mL/hr now discontinued  -Continue to Monitor and Trend intermittently   Obesity -Complicates overall prognosis and care -Estimated body mass index is 30.41 kg/m as calculated from the following:   Height as of this encounter: '5\' 8"'$  (1.727 m).   Weight as of this encounter: 90.7 kg. -Weight Loss and Dietary Counseling given   DVT prophylaxis: Enoxaparin 40 mg sq q24h; SCDs Code Status: FULL CODE Family Communication: No family  present at bedside  Disposition Plan: Pending further clinical improvement and clearance by Orthopedic Surgery and evaluation by PT/OT; PT/OT Recommending CIR but patient looking into going home with Home Health if she does not elect for CIR  Status is: Inpatient  Remains inpatient appropriate because:Unsafe d/c plan, IV treatments appropriate due to intensity of illness or inability to take PO, and Inpatient level of care appropriate due to severity of illness  Dispo: The patient is from: Home              Anticipated d/c is to:  TBD              Patient currently is not medically stable to d/c.   Difficult to place patient No   Consultants:  Orthopedic Surgery General Surgery (Trauma Consult)  Procedures:  OPEN REDUCTION INTERNAL FIXATION (ORIF) ANKLE FRACTURE (Right) OPEN REDUCTION INTERNAL (ORIF) FIXATION PATELLA (Left)  Antimicrobials:  Anti-infectives (From admission, onward)    Start     Dose/Rate Route Frequency Ordered Stop   07/01/21 1615  ceFAZolin (ANCEF) IVPB 2g/100 mL premix        2 g 200 mL/hr over 30 Minutes Intravenous Every 8 hours 07/01/21 1518 07/02/21 0637   07/01/21 1305  vancomycin (VANCOCIN) powder  Status:  Discontinued  As needed 07/01/21 1317 07/01/21 1346   07/01/21 0800  ceFAZolin (ANCEF) IVPB 2g/100 mL premix  Status:  Discontinued        2 g 200 mL/hr over 30 Minutes Intravenous On call to O.R. 07/01/21 0701 07/01/21 1515       Subjective: Seen and examined at bedside and was doing ok this AM and states her knee started hurting last night. Orthopedic PA at bedside removing dressings. She has not had a bowel movement yet. No CP or SOB. No other concerns or complaints at this time.   Objective: Vitals:   07/02/21 1125 07/02/21 2100 07/03/21 0600 07/03/21 0818  BP: (!) 112/54 (!) 129/53 125/60 131/63  Pulse: 70 76 64 67  Resp: '17 18 18 18  '$ Temp: 98.2 F (36.8 C) (!) 97.5 F (36.4 C) 98.5 F (36.9 C) 98.3 F (36.8 C)  TempSrc:   Oral Oral Oral  SpO2: 100% 100% 96% 98%  Weight:      Height:        Intake/Output Summary (Last 24 hours) at 07/03/2021 0910 Last data filed at 07/02/2021 2143 Gross per 24 hour  Intake 240 ml  Output --  Net 240 ml    Filed Weights   07/01/21 0700 07/01/21 0933  Weight: 93.4 kg 90.7 kg   Examination: Physical Exam:  Constitutional: WN/WD obese Caucasian female in NAD and appears calm but a little uncomfortable Eyes: Lids and conjunctivae normal, sclerae anicteric  ENMT: External Ears, Nose appear normal. Grossly normal hearing.  Neck: Appears normal, supple, no cervical masses, normal ROM, no appreciable thyromegaly; no JVD Respiratory: Clear to auscultation bilaterally, no wheezing, rales, rhonchi or crackles. Normal respiratory effort and patient is not tachypenic. No accessory muscle use.  Cardiovascular: RRR, no murmurs / rubs / gallops. S1 and S2 auscultated.  Abdomen: Soft, non-tender, Distended 2/2 to body habitus. Bowel sounds positive.  GU: Deferred. Musculoskeletal: No clubbing / cyanosis of digits/nails. Left Knee Incision noted and appears C/D/I as brace is open and dressing removed. Right Ankle Dressing still in place Skin: No rashes, lesions, ulcers on a limited skin evaluation and Dressings and Incisions appear C/D/I. No induration; Warm and dry.  Neurologic: CN 2-12 grossly intact with no focal deficits. Romberg sign and cerebellar reflexes not assessed.  Psychiatric: Normal judgment and insight. Alert and oriented x 3. Normal mood and appropriate affect.   Data Reviewed: I have personally reviewed following labs and imaging studies  CBC: Recent Labs  Lab 06/26/21 1900 06/27/21 0222 06/28/21 0113 06/30/21 0305 07/01/21 0818 07/02/21 0241 07/03/21 0335  WBC 14.6*   < > 7.8 8.9 8.9 10.0 12.4*  NEUTROABS 12.3*  --   --   --  5.8 8.8* 9.5*  HGB 12.5   < > 10.7* 11.3* 12.0 9.6* 9.5*  HCT 38.7   < > 32.6* 34.3* 36.9 29.0* 28.4*  MCV 96.0   < > 95.0 94.8  93.9 94.2 95.0  PLT 306   < > 266 303 337 291 319   < > = values in this interval not displayed.    Basic Metabolic Panel: Recent Labs  Lab 06/27/21 0222 06/28/21 0113 07/01/21 0818 07/02/21 0241 07/03/21 0335  NA 138 133* 138 137 140  K 4.2 3.9 4.3 4.4 3.8  CL 104 98 102 104 107  CO2 '27 29 28 27 26  '$ GLUCOSE 151* 127* 113* 155* 107*  BUN '13 12 15 14 14  '$ CREATININE 0.82 0.87 0.72 0.67 0.79  CALCIUM 8.7* 8.7*  9.1 8.9 8.4*  MG  --   --  1.9 2.0 1.8  PHOS  --   --  3.9 2.5 3.0    GFR: Estimated Creatinine Clearance: 84.8 mL/min (by C-G formula based on SCr of 0.79 mg/dL). Liver Function Tests: Recent Labs  Lab 06/26/21 1900 07/01/21 0818 07/02/21 0241 07/03/21 0335  AST '22 22 20 18  '$ ALT '11 20 16 14  '$ ALKPHOS 42 40 35* 40  BILITOT 0.8 0.5 0.4 0.3  PROT 6.4* 6.7 6.0* 5.6*  ALBUMIN 3.7 3.5 3.0* 2.8*    No results for input(s): LIPASE, AMYLASE in the last 168 hours. No results for input(s): AMMONIA in the last 168 hours. Coagulation Profile: No results for input(s): INR, PROTIME in the last 168 hours. Cardiac Enzymes: No results for input(s): CKTOTAL, CKMB, CKMBINDEX, TROPONINI in the last 168 hours. BNP (last 3 results) No results for input(s): PROBNP in the last 8760 hours. HbA1C: Recent Labs    07/02/21 0241  HGBA1C 5.8*    CBG: No results for input(s): GLUCAP in the last 168 hours. Lipid Profile: No results for input(s): CHOL, HDL, LDLCALC, TRIG, CHOLHDL, LDLDIRECT in the last 72 hours. Thyroid Function Tests: No results for input(s): TSH, T4TOTAL, FREET4, T3FREE, THYROIDAB in the last 72 hours. Anemia Panel: Recent Labs    07/02/21 0241  VITAMINB12 133*  FOLATE 8.5  FERRITIN 170  TIBC 256  IRON 21*  RETICCTPCT 1.8    Sepsis Labs: No results for input(s): PROCALCITON, LATICACIDVEN in the last 168 hours.  Recent Results (from the past 240 hour(s))  Resp Panel by RT-PCR (Flu A&B, Covid) Nasopharyngeal Swab     Status: None   Collection Time:  06/26/21  9:13 PM   Specimen: Nasopharyngeal Swab; Nasopharyngeal(NP) swabs in vial transport medium  Result Value Ref Range Status   SARS Coronavirus 2 by RT PCR NEGATIVE NEGATIVE Final    Comment: (NOTE) SARS-CoV-2 target nucleic acids are NOT DETECTED.  The SARS-CoV-2 RNA is generally detectable in upper respiratory specimens during the acute phase of infection. The lowest concentration of SARS-CoV-2 viral copies this assay can detect is 138 copies/mL. A negative result does not preclude SARS-Cov-2 infection and should not be used as the sole basis for treatment or other patient management decisions. A negative result may occur with  improper specimen collection/handling, submission of specimen other than nasopharyngeal swab, presence of viral mutation(s) within the areas targeted by this assay, and inadequate number of viral copies(<138 copies/mL). A negative result must be combined with clinical observations, patient history, and epidemiological information. The expected result is Negative.  Fact Sheet for Patients:  EntrepreneurPulse.com.au  Fact Sheet for Healthcare Providers:  IncredibleEmployment.be  This test is no t yet approved or cleared by the [place] FDA and  has been authorized for detection and/or diagnosis of SARS-CoV-2 by FDA under an Emergency Use Authorization (EUA). This EUA will remain  in effect (meaning this test can be used) for the duration of the COVID-19 declaration under Section 564(b)(1) of the Act, 21 U.S.C.section 360bbb-3(b)(1), unless the authorization is terminated  or revoked sooner.       Influenza A by PCR NEGATIVE NEGATIVE Final   Influenza B by PCR NEGATIVE NEGATIVE Final    Comment: (NOTE) The Xpert Xpress SARS-CoV-2/FLU/RSV plus assay is intended as an aid in the diagnosis of influenza from Nasopharyngeal swab specimens and should not be used as a sole basis for treatment. Nasal washings  and aspirates are unacceptable for Xpert Xpress SARS-CoV-2/FLU/RSV  testing.  Fact Sheet for Patients: EntrepreneurPulse.com.au  Fact Sheet for Healthcare Providers: IncredibleEmployment.be  This test is not yet approved or cleared by the [place] FDA and has been authorized for detection and/or diagnosis of SARS-CoV-2 by FDA under an Emergency Use Authorization (EUA). This EUA will remain in effect (meaning this test can be used) for the duration of the COVID-19 declaration under Section 564(b)(1) of the Act, 21 U.S.C. section 360bbb-3(b)(1), unless the authorization is terminated or revoked.  Performed at [facility], [place] [address], [place], [place] [address]   Surgical pcr screen     Status: None   Collection Time: 06/27/21  8:20 PM   Specimen: Nasal Mucosa; Nasal Swab  Result Value Ref Range Status   MRSA, PCR NEGATIVE NEGATIVE Final   Staphylococcus aureus NEGATIVE NEGATIVE Final    Comment: (NOTE) The Xpert SA Assay (FDA approved for NASAL specimens in patients 89 years of age and older), is one component of a comprehensive surveillance program. It is not intended to diagnose infection nor to guide or monitor treatment. Performed at [facility], [place] [address] [place], [place] [address]     RN Pressure Injury Documentation:   Estimated body mass index is 30.41 kg/m as calculated from the following:   Height as of this encounter: '5\' 8"'$  (1.727 m).   Weight as of this encounter: 90.7 kg.  Malnutrition Type:   Malnutrition Characteristics:   Nutrition Interventions:     Radiology Studies: DG Knee 1-2 Views Left  Result Date: 07/01/2021 CLINICAL DATA:  Two heart surgery. ORIF right ankle and ORIF left patella. EXAM: DG C-ARM 1-60 MIN; RIGHT ANKLE - COMPLETE 3+ VIEW; LEFT KNEE - 1-2 VIEW FLUOROSCOPY TIME:  Fluoroscopy Time:  1 minute, 4 seconds Radiation Exposure Index (if provided by the fluoroscopic  device): 1.84 mGy. Number of Acquired Spot Images: 21 COMPARISON:  06/26/2021. FINDINGS: Twenty-one total C-arm fluoroscopic images were obtained intraoperatively and submitted for post operative interpretation. The ankle fluoroscopy demonstrates plate screw fixation distal fibula, single screw fixation through the distal tibia, and tight rope syndesmotic repair. Improved alignment, near anatomic. The patellar fluoroscopy demonstrates plate and screw fixation of the patellar fracture with improved, near anatomic alignment. No unexpected findings. Expected soft tissue swelling and gas. Please see the performing provider's procedural report for further detail. IMPRESSION: Intraoperative fluoroscopy during ankle and patellar surgery. Electronically Signed   By: Margaretha Sheffield MD   On: 07/01/2021 14:33   DG Ankle Complete Right  Result Date: 07/01/2021 CLINICAL DATA:  Two heart surgery. ORIF right ankle and ORIF left patella. EXAM: DG C-ARM 1-60 MIN; RIGHT ANKLE - COMPLETE 3+ VIEW; LEFT KNEE - 1-2 VIEW FLUOROSCOPY TIME:  Fluoroscopy Time:  1 minute, 4 seconds Radiation Exposure Index (if provided by the fluoroscopic device): 1.84 mGy. Number of Acquired Spot Images: 21 COMPARISON:  06/26/2021. FINDINGS: Twenty-one total C-arm fluoroscopic images were obtained intraoperatively and submitted for post operative interpretation. The ankle fluoroscopy demonstrates plate screw fixation distal fibula, single screw fixation through the distal tibia, and tight rope syndesmotic repair. Improved alignment, near anatomic. The patellar fluoroscopy demonstrates plate and screw fixation of the patellar fracture with improved, near anatomic alignment. No unexpected findings. Expected soft tissue swelling and gas. Please see the performing provider's procedural report for further detail. IMPRESSION: Intraoperative fluoroscopy during ankle and patellar surgery. Electronically Signed   By: Margaretha Sheffield MD   On: 07/01/2021 14:33    DG Knee Left Port  Result  Date: 07/01/2021 CLINICAL DATA:  Status post ORIF left patella EXAM: PORTABLE LEFT KNEE - 1-2 VIEW COMPARISON:  June 26, 2021 FINDINGS: Interval plate and screw fixation of the comminuted patellar fracture which appears in anatomic alignment. Postsurgical changes evident with a joint effusion and subcutaneous edema/gas. IMPRESSION: Postsurgical change of ORIF of comminuted patellar fracture. Electronically Signed   By: Dahlia Bailiff MD   On: 07/01/2021 16:36   DG Ankle Right Port  Result Date: 07/01/2021 CLINICAL DATA:  Status post ORIF EXAM: PORTABLE RIGHT ANKLE - 2 VIEW COMPARISON:  06/26/2021 FINDINGS: Interval surgical plate and multiple screw fixation of the distal fibula across comminuted distal fibular fracture. Fixating screw at the distal tibia with cortical anchor medially at the distal tibia. Near anatomic alignment. Ankle mortise symmetric. IMPRESSION: Interval surgical fixation of distal tibial and fibular fractures Electronically Signed   By: Donavan Foil M.D.   On: 07/01/2021 16:36   DG C-Arm 1-60 Min  Result Date: 07/01/2021 CLINICAL DATA:  Two heart surgery. ORIF right ankle and ORIF left patella. EXAM: DG C-ARM 1-60 MIN; RIGHT ANKLE - COMPLETE 3+ VIEW; LEFT KNEE - 1-2 VIEW FLUOROSCOPY TIME:  Fluoroscopy Time:  1 minute, 4 seconds Radiation Exposure Index (if provided by the fluoroscopic device): 1.84 mGy. Number of Acquired Spot Images: 21 COMPARISON:  06/26/2021. FINDINGS: Twenty-one total C-arm fluoroscopic images were obtained intraoperatively and submitted for post operative interpretation. The ankle fluoroscopy demonstrates plate screw fixation distal fibula, single screw fixation through the distal tibia, and tight rope syndesmotic repair. Improved alignment, near anatomic. The patellar fluoroscopy demonstrates plate and screw fixation of the patellar fracture with improved, near anatomic alignment. No unexpected findings. Expected soft tissue  swelling and gas. Please see the performing provider's procedural report for further detail. IMPRESSION: Intraoperative fluoroscopy during ankle and patellar surgery. Electronically Signed   By: Margaretha Sheffield MD   On: 07/01/2021 14:33    Scheduled Meds:  cholecalciferol  2,000 Units Oral Daily   docusate sodium  100 mg Oral BID   enoxaparin (LOVENOX) injection  40 mg Subcutaneous Q24H   iron polysaccharides  150 mg Oral Daily   vitamin B-12  1,000 mcg Oral Daily   Continuous Infusions:  0.9 % NaCl with KCl 20 mEq / L 75 mL/hr at 07/02/21 2145   magnesium sulfate bolus IVPB     methocarbamol (ROBAXIN) IV      LOS: 6 days   Kerney Elbe, DO [facility] PAGER is on AMION  If 7PM-7AM, please contact night-coverage [email or web address]

## 2021-07-03 NOTE — Progress Notes (Signed)
Occupational Therapy Treatment Patient Details Name: Courtney Berg MRN: [id number] DOB: [birth year] Today's Date: 07/03/2021    History of present illness The pt is a 65 year old female who presented to the ED 8/5 after fall downstairs at work, left knee x-ray showed patella fracture, right ankle and foot x-ray showed distal tibia and fibula fracture and dislocated ankle. Pt now s/p ORIF of R ankle and L patella 8/10. PMH includes diabetes, hypertension, cancer.   OT comments  Pt doing well with OT goals this session. Pt able to complete a stand pivot transfer with max A +2 to the recliner this session to simulate toilet transfers. She continues to demonstrate difficulties maintaining weightbearing precautions on RLE, due to increased pain in LLE knee. Pt continues to benefit from Intensive rehab prior to returning home due to safety concerns, maintaining NWB in LLE, and maximizing her return to independence. OT will follow acutely.    Follow Up Recommendations  CIR    Equipment Recommendations  Other (comment) (drop arm BSC)    Recommendations for Other Services Rehab consult    Precautions / Restrictions Precautions Precautions: Fall Required Braces or Orthoses: Knee Immobilizer - Left;Other Brace Knee Immobilizer - Left: On when out of bed or walking Other Brace: CAM walker boot RLE Restrictions Weight Bearing Restrictions: Yes RLE Weight Bearing: Non weight bearing LLE Weight Bearing: Non weight bearing Other Position/Activity Restrictions: pt is allowed gentle ROM to R ankle out of CAM boot when at rest with leg supported       Mobility Bed Mobility               General bed mobility comments: sitting EOB on OT entrance    Transfers Overall transfer level: Needs assistance Equipment used: Rolling walker (2 wheeled) Transfers: Sit to/from [facility] Sit to Stand: Max assist;+2 physical assistance;+2 safety/equipment;From elevated surface Stand  pivot transfers: Max assist;+2 physical assistance;+2 safety/equipment       General transfer comment: Pt able to complete pivot transfer this session with Max A +2 assist    Balance Overall balance assessment: Needs assistance Sitting-balance support: Feet unsupported Sitting balance-Leahy Scale: Good Sitting balance - Comments: pt leaning outside BOS with limited support of BLE with no LOB   Standing balance support: Bilateral upper extremity supported;During functional activity Standing balance-Leahy Scale: Poor Standing balance comment: pt reliant on BUE support to maintain balance with WB precautions                           ADL either performed or assessed with clinical judgement   ADL Overall ADL's : Needs assistance/impaired                         Toilet Transfer: Maximal assistance;+2 for physical assistance;+2 for safety/equipment;Stand-pivot Toilet Transfer Details (indicate cue type and reason): Pt able to complete pivot to the chair this session, requirin gmax A to remain upright and attempt to remain NWB on RLE.         Functional mobility during ADLs: Maximal assistance;+2 for physical assistance;+2 for safety/equipment;Rolling walker General ADL Comments: Pt tolerating standing and pivotting more this session, able to complete transfer to chair with pivot.     Vision       Perception     Praxis      Cognition Arousal/Alertness: Awake/alert Behavior During Therapy: WFL for tasks assessed/performed Overall Cognitive Status: Within Functional Limits for tasks assessed  Exercises Exercises: Other exercises Other Exercises Other Exercises: chair pushups x5 Other Exercises: ankle pumps x5   Shoulder Instructions       General Comments VSS on RA, bandages removed from L knee    Pertinent Vitals/ Pain       Pain Assessment: Faces Faces Pain Scale: Hurts even more Pain  Location: L knee with dependent position and wt bearing Pain Descriptors / Indicators: Aching;Sore Pain Intervention(s): Limited activity within patient's tolerance;Monitored during session;Repositioned  Home Living                                          Prior Functioning/Environment              Frequency  Min 2X/week        Progress Toward Goals  OT Goals(current goals can now be found in the care plan section)  Progress towards OT goals: Progressing toward goals  Acute Rehab OT Goals Patient Stated Goal: return to independence OT Goal Formulation: With patient Time For Goal Achievement: 07/16/21 Potential to Achieve Goals: Good ADL Goals Pt Will Perform Lower Body Bathing: with min assist;sitting/lateral leans;sit to/from stand Pt Will Perform Lower Body Dressing: with min assist;sitting/lateral leans;sit to/from stand Pt Will Transfer to Toilet: with min assist;stand pivot transfer Pt Will Perform Toileting - Clothing Manipulation and hygiene: with min assist;sitting/lateral leans;sit to/from stand  Plan Discharge plan remains appropriate;Frequency remains appropriate    Co-evaluation    PT/OT/SLP Co-Evaluation/Treatment: Yes Reason for Co-Treatment: For patient/therapist safety;To address functional/ADL transfers PT goals addressed during session: Balance;Mobility/safety with mobility;Proper use of DME;Strengthening/ROM OT goals addressed during session: ADL's and self-care;Strengthening/ROM;Proper use of Adaptive equipment and DME      AM-PAC OT "6 Clicks" Daily Activity     Outcome Measure   Help from another person eating meals?: None Help from another person taking care of personal grooming?: A Little Help from another person toileting, which includes using toliet, bedpan, or urinal?: A Lot Help from another person bathing (including washing, rinsing, drying)?: A Lot Help from another person to put on and taking off regular upper body  clothing?: A Little Help from another person to put on and taking off regular lower body clothing?: A Lot 6 Click Score: 16    End of Session Equipment Utilized During Treatment: Gait belt;Rolling walker;Left knee immobilizer  OT Visit Diagnosis: Unsteadiness on feet (R26.81);Other abnormalities of gait and mobility (R26.89);Muscle weakness (generalized) (M62.81);Pain Pain - Right/Left: Left Pain - part of body: Knee   Activity Tolerance Patient tolerated treatment well;Patient limited by pain   Patient Left in chair;with call bell/phone within reach;with chair alarm set   Nurse Communication Mobility status        Time: [id number] OT Time Calculation (min): 18 min  Charges: OT General Charges $OT Visit: 1 Visit OT Treatments $Self Care/Home Management : 8-22 mins  Renesmae Donahey H., OTR/L Acute Rehabilitation  Ritaj Dullea Elane Yolanda Bonine 07/03/2021, 2:37 PM

## 2021-07-04 DIAGNOSIS — S82201A Unspecified fracture of shaft of right tibia, initial encounter for closed fracture: Secondary | ICD-10-CM | POA: Diagnosis not present

## 2021-07-04 DIAGNOSIS — D62 Acute posthemorrhagic anemia: Secondary | ICD-10-CM | POA: Diagnosis not present

## 2021-07-04 DIAGNOSIS — S82002A Unspecified fracture of left patella, initial encounter for closed fracture: Secondary | ICD-10-CM | POA: Diagnosis not present

## 2021-07-04 DIAGNOSIS — T7840XA Allergy, unspecified, initial encounter: Secondary | ICD-10-CM | POA: Diagnosis not present

## 2021-07-04 LAB — CBC WITH DIFFERENTIAL/PLATELET
Abs Immature Granulocytes: 0.03 10*3/uL (ref 0.00–0.07)
Basophils Absolute: 0.1 10*3/uL (ref 0.0–0.1)
Basophils Relative: 1 %
Eosinophils Absolute: 0.2 10*3/uL (ref 0.0–0.5)
Eosinophils Relative: 2 %
HCT: 29.6 % — ABNORMAL LOW (ref 36.0–46.0)
Hemoglobin: 9.5 g/dL — ABNORMAL LOW (ref 12.0–15.0)
Immature Granulocytes: 0 %
Lymphocytes Relative: 25 %
Lymphs Abs: 2.2 10*3/uL (ref 0.7–4.0)
MCH: 30.6 pg (ref 26.0–34.0)
MCHC: 32.1 g/dL (ref 30.0–36.0)
MCV: 95.5 fL (ref 80.0–100.0)
Monocytes Absolute: 1.1 10*3/uL — ABNORMAL HIGH (ref 0.1–1.0)
Monocytes Relative: 13 %
Neutro Abs: 5.2 10*3/uL (ref 1.7–7.7)
Neutrophils Relative %: 59 %
Platelets: 335 10*3/uL (ref 150–400)
RBC: 3.1 MIL/uL — ABNORMAL LOW (ref 3.87–5.11)
RDW: 13.6 % (ref 11.5–15.5)
WBC: 8.8 10*3/uL (ref 4.0–10.5)
nRBC: 0 % (ref 0.0–0.2)

## 2021-07-04 LAB — MAGNESIUM: Magnesium: 1.8 mg/dL (ref 1.7–2.4)

## 2021-07-04 MED ORDER — MAGNESIUM SULFATE 2 GM/50ML IV SOLN
2.0000 g | Freq: Once | INTRAVENOUS | Status: AC
  Administered 2021-07-04: 2 g via INTRAVENOUS
  Filled 2021-07-04: qty 50

## 2021-07-04 MED ORDER — BISACODYL 10 MG RE SUPP
10.0000 mg | Freq: Every day | RECTAL | Status: DC | PRN
  Administered 2021-07-05: 10 mg via RECTAL
  Filled 2021-07-04: qty 1

## 2021-07-04 NOTE — Progress Notes (Signed)
Physical Therapy Treatment Patient Details Name: Courtney Berg MRN: [id number] DOB: [birth year] Today's Date: 07/04/2021    History of Present Illness The pt is a 65 year old female who presented to the ED 8/5 after fall downstairs at work, left knee x-ray showed patella fracture, right ankle and foot x-ray showed distal tibia and fibula fracture and dislocated ankle. Pt now s/p ORIF of R ankle and L patella 8/10. PMH includes diabetes, hypertension, cancer.    PT Comments    Pt supine in bed on arrival this session.  Pt has been having difficulty coming to standing due to weight bearing restrictions and innability to flex L knee.  Performed AP transfers into and out of WC this session.  Pt fatigued post session but required decreased assistance with this technique.      Follow Up Recommendations  CIR     Equipment Recommendations  Wheelchair (measurements PT);Wheelchair cushion (measurements PT)    Recommendations for Other Services       Precautions / Restrictions Precautions Precautions: Fall Required Braces or Orthoses: Knee Immobilizer - Left;Other Brace Knee Immobilizer - Left: On at all times (L bledsoe lock in extension.) Other Brace: CAM walker boot RLE Restrictions Weight Bearing Restrictions: Yes RLE Weight Bearing: Non weight bearing LLE Weight Bearing: Weight bearing as tolerated Other Position/Activity Restrictions: pt is allowed gentle ROM to R ankle out of CAM boot when at rest with leg supported    Mobility  Bed Mobility Overal bed mobility: Needs Assistance Bed Mobility: Supine to Sit;Sit to Supine     Supine to sit: Min assist Sit to supine: Min assist   General bed mobility comments: Min assistance for trunk control and min assistance to manage LEs to edge of bed.    Transfers Overall transfer level: Needs assistance Equipment used: None Transfers: Comptroller transfers: Min assist;Mod assist;+2  physical assistance   General transfer comment: Pt able to perform posterior scoot with Min +1, required mod +2 for anterior scoot back to bed.  Ambulation/Gait Ambulation/Gait assistance:  (unable.)               Theme park manager mobility: Yes Wheelchair propulsion: Both upper extremities Wheelchair parts: Needs assistance Distance: 38f x 3 trials, required rest breaks between due to fatigue and DOE. Wheelchair Assistance Details (indicate cue type and reason): Cues for locking brakes and cues for use of drive wheels to move forward, backward and for turns.  Modified Rankin (Stroke Patients Only)       Balance Overall balance assessment: Needs assistance;History of Falls Sitting-balance support: Feet supported;Bilateral upper extremity supported Sitting balance-Leahy Scale: Good       Standing balance-Leahy Scale: Poor                              Cognition Arousal/Alertness: Awake/alert Behavior During Therapy: WFL for tasks assessed/performed Overall Cognitive Status: Within Functional Limits for tasks assessed                                 General Comments: pt pleasant and following all cues approrpiately      Exercises      General Comments        Pertinent Vitals/Pain Pain Assessment: Faces Faces Pain Scale: Hurts even more  Pain Location: L knee Pain Descriptors / Indicators: Discomfort;Sore;Grimacing;Guarding Pain Intervention(s): Monitored during session;Repositioned;Ice applied    Home Living                      Prior Function            PT Goals (current goals can now be found in the care plan section) Acute Rehab PT Goals Patient Stated Goal: return to independence Potential to Achieve Goals: Good Additional Goals Additional Goal #1: Pt will propel wheelchair with BUE's x 150 feet modI Progress towards PT goals: Progressing toward  goals    Frequency    Min 5X/week      PT Plan Current plan remains appropriate    Co-evaluation              AM-PAC PT "6 Clicks" Mobility   Outcome Measure  Help needed turning from your back to your side while in a flat bed without using bedrails?: A Little Help needed moving from lying on your back to sitting on the side of a flat bed without using bedrails?: A Little Help needed moving to and from a bed to a chair (including a wheelchair)?: A Lot Help needed standing up from a chair using your arms (e.g., wheelchair or bedside chair)?: Total Help needed to walk in hospital room?: Total Help needed climbing 3-5 steps with a railing? : Total 6 Click Score: 11    End of Session Equipment Utilized During Treatment: Gait belt;Left knee immobilizer Activity Tolerance: Patient limited by fatigue;Patient limited by pain Patient left: in bed;with call bell/phone within reach;with bed alarm set Nurse Communication: Mobility status PT Visit Diagnosis: Other abnormalities of gait and mobility (R26.89);Muscle weakness (generalized) (M62.81);Pain;Unsteadiness on feet (R26.81) Pain - Right/Left: Left Pain - part of body: Knee     Time: [id number] PT Time Calculation (min) (ACUTE ONLY): 42 min  Charges:  $Therapeutic Activity: 23-37 mins $Wheel Chair Management: 8-22 mins                     Erasmo Leventhal , PTA Acute Rehabilitation Services Pager [phone] Office [phone]    Jeanise Durfey Eli Hose 07/04/2021, 3:18 PM

## 2021-07-04 NOTE — Plan of Care (Signed)
  Problem: Physical Regulation: Goal: Postoperative complications will be avoided or minimized Outcome: Progressing   Problem: Pain Management: Goal: Pain level will decrease with appropriate interventions Outcome: Progressing   Problem: Skin Integrity: Goal: Will show signs of wound healing Outcome: Progressing

## 2021-07-04 NOTE — Progress Notes (Addendum)
PROGRESS NOTE    Courtney Berg  [id number] DOB: 03-21-1956 DOA: 06/26/2021 PCP: Susy Frizzle, MD   Brief Narrative:  The patient is a 65 year old obese Caucasian female with a past medical history significant for but not limited to history of allergies as well as other comorbidities who presented to ED after fall.  She fell downstairs at her work and she landed on her left knee and also noted to have significant right ankle pain and deformity.  Left knee showed patellar fracture and right ankle and foot x-ray showed distal tibia and fibular fracture as well as dislocated ankle.  She was admitted for her trimalleolar ankle fracture and left patellar fracture and orthopedic surgery was consulted.  Orthopedic surgery is planning on taking the patient for surgical intervention for ORIF of the Right Ankle and the Left Knee.  PT and OT evaluated and recommending Inpatient Rehab but patient evaluating options of possibly going home with Home Health if she does not elect to go to CIR. Currently she is now leaning toward going to CIR given her lack of mobility and pain.   Assessment & Plan:   Principal Problem:   Tibia/fibula fracture, right, closed, initial encounter Active Problems:   Allergies   Left patella fracture  Right Trimalleolar Ankle Fracture s/p ORIF on 07/01/21 -Had a Mechanical Fall approximately down 3 stairs afer her heel slipped -DG Ankle showed "Interval reduction of fractures and talar dislocation." -CT Ankle showed "Complex comminuted spiral type fracture involving the distal fibular shaft at and above the level of the ankle mortise. Oblique coursing comminuted longitudinal intra-articular fracture involving the posterior malleolus of the tibia. Maximum posterior displacement is 7 mm. Maximum gap at the articular surface is 5 mm. Small impaction type fracture involving the medial and anterior aspect of the talar dome. Slight widening of the medial mortise and a few tiny bony  densities near the talus. Possible deltoid ligament injury."  -C/w Pain Control per orthopedic surgery as delineated with Acetaminophen 325-650 mg every 6hprn Mild Pain for pain 1-3, Robaxin 500 mg po/IV every 6 as needed Muscle Spasms, Hydrocodone-Acetaminophen 5-325 mg (1-2 Tab) and 7.5-325 mg (1-2 Tab) every 4 as needed,  as well as Hydromorphone 0.5-1 mg every 4 as needed -Vitamin D level was 19.11 so she is will be started on vitamin D3 supplementation with Cholecalciferol 2,000 units po Daily   -Orthopedics planning ORIF and contacting Orthopedic Trauamtologists for definitive care  -Surgery on Wednesday 07/01/21 by Dr. Doreatha Martin and she is status post ORIF right ankle fracture -She has now been ordered for a right lower extremity CAM walker -Orthopedic recommending nonweightbearing of the right lower extremity but recommending okay for right ankle range of motion as tolerated at rest -We will need a bowel regimen -Orthopedic removed dressings today and letting air out today  -PT/OT recommending CIR but patient may elect to go home with Home Health with 24/7 Care if it can be arranged but now patient feels as if the best option would be going to CIR first and then going home   Hypotension, improved  -Mild and Asymptomatic. Patient states normally her BP's run on the softer sider but haven't been this low -Given a bolus of 500 mL on 07/02/21; mIVF now discontinued  -Last blood pressure reading was 128/49 this AM -Continue to Monitor BP per Protocol    Left Patella Fracture s/p ORIF 07/01/21 -DG Left Knee showed "There are changes consistent with a transverse fracture through the patella. There  is distraction of the fracture fragments of at least 3 cm. Fat fluid effusion is noted between the fracture  fragments. Additionally on the frontal film there is fragmentation of the more superior fragment identified" -Left leg in knee immobilizer -Surgery today on Wednesday 07/01/21 and she underwent ORIF of  the left patella -Orthopedic surgery recommending weightbearing as tolerated in the left lower extremity with AKI/hinged brace locked in full extension which has still yet to be delivered but has been ordered  -Pain control as above   Acute Urinary Retention -Bladder scan obtained the day before yesterday showed 800 cc urine -Likely induced by opioids -Continue in-and-out cath every 6 hours as needed -Improving and continue to Monitor -Up and Out of Bed   Leukocytosis -Likely Reactive in the setting of Pain and Surgery -WBC went from 7.8 -> 8.9 -> 8.9 -> 10.0 -> 12.4 -> 8.8 -Patient is afebrile -Continue to Monitor for S/Sx of Infection -Repeat CBC intermittently now that Leukocytosis has resolved  Hyperglycemia in setting of Prediabetes -Patient's Blood Sugar on Admission was 151 on CMP and yesterday a.m. it is 107; ranging from 107-155 daily BMP/CMP's -Continue to Monitor Blood Sugars per Protocol and if Necessary will place on Sensitive Novolog SSI AC -Check HbA1c is 5.8  Constipation -Has not had a bowel movement in over a 1 week -C/w Bowel Regimen with Docusate 100 mg po BID and Miralax 17 gram dailyprn;  -Will add Bisacodyl 10 mg DailyPRN suppository  -If no bowel movement by tomorrow will adjust bowel reigmen further  Normocytic Anemia/IDA superimposed on Postoperative acute blood loss anemia -Patient's Hgb/Hct went from 11.5/34.7 -> 10.7/32.6 -> 11.3/34.3 -> 12.0/36.9 -> 9.6/29.0 -> 9.5/28.4 -> 9.5/29.6 and likely Post-Opertative Drop and Blood Loss along with some component of dilution  -Checked Anemia Panel and showed an iron level of 21, U IBC of 235, TIBC 256, saturation ratios of 8%, ferritin level 170, folate level 8.5, and vitamin B12 level of 133 -We will start supplementation Niferex 150 mg p.o. daily as well as given a 1000 mcg injection of cyanocobalamin yesterday and start p.o. cyanocobalamin 1000 mcg today  -Continue to Monitor for S/Sx of Bleeding; Currently no  overt bleeding noted -Repeat CBC intermittently now that Hgb/Hct appears stable   Hyponatremia -Mild at 133 and now improved to 140 -IVF with NS + 20 mEQ KCL at 75 mL/hr now discontinued  -Continue to Monitor and Trend  BMP intermittently   Obesity -Complicates overall prognosis and care -Estimated body mass index is 30.41 kg/m as calculated from the following:   Height as of this encounter: '5\' 8"'$  (1.727 m).   Weight as of this encounter: 90.7 kg. -Weight Loss and Dietary Counseling given   DVT prophylaxis: Enoxaparin 40 mg sq q24h; SCDs Code Status: FULL CODE Family Communication: No family present at bedside  Disposition Plan: Pending further clinical improvement and clearance by Orthopedic Surgery and evaluation by PT/OT; PT/OT Recommending CIR but patient looking into going home with Home Health if she does not elect for CIR  Status is: Inpatient  Remains inpatient appropriate because:Unsafe d/c plan, IV treatments appropriate due to intensity of illness or inability to take PO, and Inpatient level of care appropriate due to severity of illness  Dispo: The patient is from: Home              Anticipated d/c is to:  TBD              Patient currently is not medically stable  to d/c.   Difficult to place patient No   Consultants:  Orthopedic Surgery General Surgery (Trauma Consult)  Procedures:  OPEN REDUCTION INTERNAL FIXATION (ORIF) ANKLE FRACTURE (Right) OPEN REDUCTION INTERNAL (ORIF) FIXATION PATELLA (Left)  Antimicrobials:  Anti-infectives (From admission, onward)    Start     Dose/Rate Route Frequency Ordered Stop   07/01/21 1615  ceFAZolin (ANCEF) IVPB 2g/100 mL premix        2 g 200 mL/hr over 30 Minutes Intravenous Every 8 hours 07/01/21 1518 07/02/21 0637   07/01/21 1305  vancomycin (VANCOCIN) powder  Status:  Discontinued          As needed 07/01/21 1317 07/01/21 1346   07/01/21 0800  ceFAZolin (ANCEF) IVPB 2g/100 mL premix  Status:  Discontinued        2  g 200 mL/hr over 30 Minutes Intravenous On call to O.R. 07/01/21 0701 07/01/21 1515       Subjective: Seen and examined at bedside and states she was feeling "rough." No CP or SOB. Has been up and moving this AM and was in pain but just received her pain regimen 10 minutes ago. Even if the Home Health and Equipment can be set up, the patient feels as if she will need to go to inpatient rehab first. Has not had a bowel movement in 1 week. No other concerns or complaints at this time.   Objective: Vitals:   07/03/21 0600 07/03/21 0818 07/03/21 1245 07/03/21 2042  BP: 125/60 131/63 131/68 (!) 128/49  Pulse: 64 67 69 93  Resp: '18 18 17 16  '$ Temp: 98.5 F (36.9 C) 98.3 F (36.8 C) 98.6 F (37 C) 99.8 F (37.7 C)  TempSrc: Oral Oral  Oral  SpO2: 96% 98% 100% 96%  Weight:      Height:       No intake or output data in the 24 hours ending 07/04/21 0809  Filed Weights   07/01/21 0700 07/01/21 0933  Weight: 93.4 kg 90.7 kg   Examination: Physical Exam:  Constitutional: WN/WD obese Caucasian female in NAD and appears calm and comfortable Eyes: Lids and conjunctivae normal, sclerae anicteric  ENMT: External Ears, Nose appear normal. Grossly normal hearing.  Neck: Appears normal, supple, no cervical masses, normal ROM, no appreciable thyromegaly; no JVD Respiratory: Diminished to auscultation bilaterally, no wheezing, rales, rhonchi or crackles. Normal respiratory effort and patient is not tachypenic. No accessory muscle use. Unlabored breathing  Cardiovascular: RRR, no murmurs / rubs / gallops. S1 and S2 auscultated.  Abdomen: Soft, non-tender, Distended 2/2 body habitus. Bowel sounds positive.  GU: Deferred. Musculoskeletal: No clubbing / cyanosis of digits/nails. Right leg is in a boot and Left Knee in an immobilizer Skin: No rashes, lesions, ulcers on a limited skin evaluation. No induration; Warm and dry.  Neurologic: CN 2-12 grossly intact with no focal deficits. Romberg sign and  cerebellar reflexes not assessed.  Psychiatric: Normal judgment and insight. Alert and oriented x 3. Normal mood and appropriate affect.   Data Reviewed: I have personally reviewed following labs and imaging studies  CBC: Recent Labs  Lab 06/30/21 0305 07/01/21 0818 07/02/21 0241 07/03/21 0335 07/04/21 0250  WBC 8.9 8.9 10.0 12.4* 8.8  NEUTROABS  --  5.8 8.8* 9.5* 5.2  HGB 11.3* 12.0 9.6* 9.5* 9.5*  HCT 34.3* 36.9 29.0* 28.4* 29.6*  MCV 94.8 93.9 94.2 95.0 95.5  PLT 303 337 291 [id number]    Basic Metabolic Panel: Recent Labs  Lab 06/28/21 0113 07/01/21  0818 07/02/21 0241 07/03/21 0335 07/04/21 0250  NA 133* 138 137 140  --   K 3.9 4.3 4.4 3.8  --   CL 98 102 104 107  --   CO2 '29 28 27 26  '$ --   GLUCOSE 127* 113* 155* 107*  --   BUN '12 15 14 14  '$ --   CREATININE 0.87 0.72 0.67 0.79  --   CALCIUM 8.7* 9.1 8.9 8.4*  --   MG  --  1.9 2.0 1.8 1.8  PHOS  --  3.9 2.5 3.0  --     GFR: Estimated Creatinine Clearance: 84.8 mL/min (by C-G formula based on SCr of 0.79 mg/dL). Liver Function Tests: Recent Labs  Lab 07/01/21 0818 07/02/21 0241 07/03/21 0335  AST '22 20 18  '$ ALT '20 16 14  '$ ALKPHOS 40 35* 40  BILITOT 0.5 0.4 0.3  PROT 6.7 6.0* 5.6*  ALBUMIN 3.5 3.0* 2.8*    No results for input(s): LIPASE, AMYLASE in the last 168 hours. No results for input(s): AMMONIA in the last 168 hours. Coagulation Profile: No results for input(s): INR, PROTIME in the last 168 hours. Cardiac Enzymes: No results for input(s): CKTOTAL, CKMB, CKMBINDEX, TROPONINI in the last 168 hours. BNP (last 3 results) No results for input(s): PROBNP in the last 8760 hours. HbA1C: Recent Labs    07/02/21 0241  HGBA1C 5.8*    CBG: No results for input(s): GLUCAP in the last 168 hours. Lipid Profile: No results for input(s): CHOL, HDL, LDLCALC, TRIG, CHOLHDL, LDLDIRECT in the last 72 hours. Thyroid Function Tests: No results for input(s): TSH, T4TOTAL, FREET4, T3FREE, THYROIDAB in the last 72  hours. Anemia Panel: Recent Labs    07/02/21 0241  VITAMINB12 133*  FOLATE 8.5  FERRITIN 170  TIBC 256  IRON 21*  RETICCTPCT 1.8    Sepsis Labs: No results for input(s): PROCALCITON, LATICACIDVEN in the last 168 hours.  Recent Results (from the past 240 hour(s))  Resp Panel by RT-PCR (Flu A&B, Covid) Nasopharyngeal Swab     Status: None   Collection Time: 06/26/21  9:13 PM   Specimen: Nasopharyngeal Swab; Nasopharyngeal(NP) swabs in vial transport medium  Result Value Ref Range Status   SARS Coronavirus 2 by RT PCR NEGATIVE NEGATIVE Final    Comment: (NOTE) SARS-CoV-2 target nucleic acids are NOT DETECTED.  The SARS-CoV-2 RNA is generally detectable in upper respiratory specimens during the acute phase of infection. The lowest concentration of SARS-CoV-2 viral copies this assay can detect is 138 copies/mL. A negative result does not preclude SARS-Cov-2 infection and should not be used as the sole basis for treatment or other patient management decisions. A negative result may occur with  improper specimen collection/handling, submission of specimen other than nasopharyngeal swab, presence of viral mutation(s) within the areas targeted by this assay, and inadequate number of viral copies(<138 copies/mL). A negative result must be combined with clinical observations, patient history, and epidemiological information. The expected result is Negative.  Fact Sheet for Patients:  EntrepreneurPulse.com.au  Fact Sheet for Healthcare Providers:  IncredibleEmployment.be  This test is no t yet approved or cleared by the [place] FDA and  has been authorized for detection and/or diagnosis of SARS-CoV-2 by FDA under an Emergency Use Authorization (EUA). This EUA will remain  in effect (meaning this test can be used) for the duration of the COVID-19 declaration under Section 564(b)(1) of the Act, 21 U.S.C.section 360bbb-3(b)(1), unless the  authorization is terminated  or revoked sooner.  Influenza A by PCR NEGATIVE NEGATIVE Final   Influenza B by PCR NEGATIVE NEGATIVE Final    Comment: (NOTE) The Xpert Xpress SARS-CoV-2/FLU/RSV plus assay is intended as an aid in the diagnosis of influenza from Nasopharyngeal swab specimens and should not be used as a sole basis for treatment. Nasal washings and aspirates are unacceptable for Xpert Xpress SARS-CoV-2/FLU/RSV testing.  Fact Sheet for Patients: EntrepreneurPulse.com.au  Fact Sheet for Healthcare Providers: IncredibleEmployment.be  This test is not yet approved or cleared by the [place] FDA and has been authorized for detection and/or diagnosis of SARS-CoV-2 by FDA under an Emergency Use Authorization (EUA). This EUA will remain in effect (meaning this test can be used) for the duration of the COVID-19 declaration under Section 564(b)(1) of the Act, 21 U.S.C. section 360bbb-3(b)(1), unless the authorization is terminated or revoked.  Performed at [facility], [place] [address], [place], [place] [address]   Surgical pcr screen     Status: None   Collection Time: 06/27/21  8:20 PM   Specimen: Nasal Mucosa; Nasal Swab  Result Value Ref Range Status   MRSA, PCR NEGATIVE NEGATIVE Final   Staphylococcus aureus NEGATIVE NEGATIVE Final    Comment: (NOTE) The Xpert SA Assay (FDA approved for NASAL specimens in patients 20 years of age and older), is one component of a comprehensive surveillance program. It is not intended to diagnose infection nor to guide or monitor treatment. Performed at [facility], [place] [address], [place], [place] [address]     RN Pressure Injury Documentation:   Estimated body mass index is 30.41 kg/m as calculated from the following:   Height as of this encounter: '5\' 8"'$  (1.727 m).   Weight as of this encounter: 90.7 kg.  Malnutrition Type:   Malnutrition Characteristics:    Nutrition Interventions:     Radiology Studies: No results found.  Scheduled Meds:  cholecalciferol  2,000 Units Oral Daily   docusate sodium  100 mg Oral BID   iron polysaccharides  150 mg Oral Daily   vitamin B-12  1,000 mcg Oral Daily   Continuous Infusions:  magnesium sulfate bolus IVPB     methocarbamol (ROBAXIN) IV      LOS: 7 days   Kerney Elbe, DO [facility] PAGER is on [place]  If 7PM-7AM, please contact night-coverage [email or web address]

## 2021-07-04 NOTE — Plan of Care (Signed)
  Problem: Education: Goal: Knowledge of the prescribed therapeutic regimen will improve Outcome: Completed/Met   Problem: Activity: Goal: Ability to increase mobility will improve Outcome: Progressing   Problem: Physical Regulation: Goal: Postoperative complications will be avoided or minimized Outcome: Progressing   Problem: Pain Management: Goal: Pain level will decrease with appropriate interventions Outcome: Progressing   Problem: Skin Integrity: Goal: Will show signs of wound healing Outcome: Progressing

## 2021-07-05 DIAGNOSIS — T7840XA Allergy, unspecified, initial encounter: Secondary | ICD-10-CM | POA: Diagnosis not present

## 2021-07-05 DIAGNOSIS — K5900 Constipation, unspecified: Secondary | ICD-10-CM

## 2021-07-05 DIAGNOSIS — S82201A Unspecified fracture of shaft of right tibia, initial encounter for closed fracture: Secondary | ICD-10-CM | POA: Diagnosis not present

## 2021-07-05 DIAGNOSIS — S82002A Unspecified fracture of left patella, initial encounter for closed fracture: Secondary | ICD-10-CM | POA: Diagnosis not present

## 2021-07-05 DIAGNOSIS — D62 Acute posthemorrhagic anemia: Secondary | ICD-10-CM | POA: Diagnosis not present

## 2021-07-05 MED ORDER — POLYETHYLENE GLYCOL 3350 17 G PO PACK
17.0000 g | PACK | Freq: Two times a day (BID) | ORAL | Status: DC
Start: 2021-07-05 — End: 2021-07-13
  Administered 2021-07-05 – 2021-07-09 (×7): 17 g via ORAL
  Filled 2021-07-05 (×11): qty 1

## 2021-07-05 MED ORDER — SENNOSIDES-DOCUSATE SODIUM 8.6-50 MG PO TABS
1.0000 | ORAL_TABLET | Freq: Two times a day (BID) | ORAL | Status: DC
  Administered 2021-07-05 – 2021-07-09 (×8): 1 via ORAL
  Filled 2021-07-05 (×11): qty 1

## 2021-07-05 NOTE — Plan of Care (Signed)
  Problem: Physical Regulation: Goal: Postoperative complications will be avoided or minimized Outcome: Progressing   Problem: Pain Management: Goal: Pain level will decrease with appropriate interventions Outcome: Progressing   Problem: Skin Integrity: Goal: Will show signs of wound healing Outcome: Progressing

## 2021-07-05 NOTE — Progress Notes (Signed)
PROGRESS NOTE    Courtney Berg  [id number] DOB: 1956-06-06 DOA: 06/26/2021 PCP: Susy Frizzle, MD   Brief Narrative:  The patient is a 65 year old obese Caucasian female with a past medical history significant for but not limited to history of allergies as well as other comorbidities who presented to ED after fall.  She fell downstairs at her work and she landed on her left knee and also noted to have significant right ankle pain and deformity.  Left knee showed patellar fracture and right ankle and foot x-ray showed distal tibia and fibular fracture as well as dislocated ankle.  She was admitted for her trimalleolar ankle fracture and left patellar fracture and orthopedic surgery was consulted.  Orthopedic surgery is planning on taking the patient for surgical intervention for ORIF of the Right Ankle and the Left Knee.  PT and OT evaluated and recommending Inpatient Rehab but patient evaluating options of possibly going home with Home Health if she does not elect to go to CIR. Currently she is now leaning toward going to CIR given her lack of mobility and pain.   Assessment & Plan:   Principal Problem:   Tibia/fibula fracture, right, closed, initial encounter Active Problems:   Allergies   Left patella fracture  Right Trimalleolar Ankle Fracture s/p ORIF on 07/01/21 -Had a Mechanical Fall approximately down 3 stairs afer her heel slipped -DG Ankle showed "Interval reduction of fractures and talar dislocation." -CT Ankle showed "Complex comminuted spiral type fracture involving the distal fibular shaft at and above the level of the ankle mortise. Oblique coursing comminuted longitudinal intra-articular fracture involving the posterior malleolus of the tibia. Maximum posterior displacement is 7 mm. Maximum gap at the articular surface is 5 mm. Small impaction type fracture involving the medial and anterior aspect of the talar dome. Slight widening of the medial mortise and a few tiny bony  densities near the talus. Possible deltoid ligament injury."  -C/w Pain Control per orthopedic surgery as delineated with Acetaminophen 325-650 mg every 6hprn Mild Pain for pain 1-3, Robaxin 500 mg po/IV every 6 as needed Muscle Spasms, Hydrocodone-Acetaminophen 5-325 mg (1-2 Tab) and 7.5-325 mg (1-2 Tab) every 4 as needed,  as well as Hydromorphone 0.5-1 mg every 4 as needed -Vitamin D level was 19.11 so she is will be started on vitamin D3 supplementation with Cholecalciferol 2,000 units po Daily   -Orthopedics planning ORIF and contacting Orthopedic Trauamtologists for definitive care  -Surgery on Wednesday 07/01/21 by Dr. Doreatha Martin and she is status post ORIF right ankle fracture -She has now been ordered for a right lower extremity CAM walker -Orthopedic recommending nonweightbearing of the right lower extremity but recommending okay for right ankle range of motion as tolerated at rest -We will need a bowel regimen -Orthopedic removed dressings today and letting air out today  -PT/OT recommending CIR but patient considering home with Home Health with 24/7 Care if it can be arranged but now patient feels as if the best option would be going to CIR first and then going home   Hypotension, improved  -Mild and Asymptomatic. Patient states normally her BP's run on the softer sider but haven't been this low -Given a bolus of 500 mL on 07/02/21; mIVF now discontinued  -Last blood pressure reading was 126/64 this AM -Continue to Monitor BP per Protocol    Left Patella Fracture s/p ORIF 07/01/21 -DG Left Knee showed "There are changes consistent with a transverse fracture through the patella. There is distraction of  the fracture fragments of at least 3 cm. Fat fluid effusion is noted between the fracture  fragments. Additionally on the frontal film there is fragmentation of the more superior fragment identified" -Left leg in knee immobilizer -Surgery today on Wednesday 07/01/21 and she underwent ORIF of the  left patella -Orthopedic surgery recommending weightbearing as tolerated in the left lower extremity with AKI/hinged brace locked in full extension which has still yet to be delivered but has been ordered  -Pain control as above   Acute Urinary Retention -Bladder scan obtained the day before yesterday showed 800 cc urine -Likely induced by opioids -Continue in-and-out cath every 6 hours as needed -Improving and continue to Monitor -Up and Out of Bed   Leukocytosis -Likely Reactive in the setting of Pain and Surgery -WBC went from 7.8 -> 8.9 -> 8.9 -> 10.0 -> 12.4 -> 8.8 on last check  -Patient is afebrile -Continue to Monitor for S/Sx of Infection -Repeat CBC intermittently now that Leukocytosis has resolved  Hyperglycemia in setting of Prediabetes -Patient's Blood Sugar on Admission was 151 on CMP and was 107 on last CMP check; ranging from 107-155 daily BMP/CMP's -Continue to Monitor Blood Sugars per Protocol and if Necessary will place on Sensitive Novolog SSI AC -Check HbA1c is 5.8  Constipation -Has not had a bowel movement in over a 1 week and still has not had one -Will change Bowel Regimen from Docusate 100 mg po BID and Miralax 17 gram dailyprn to Scheduled Miralax 17 gram po BID and Senna-Docusate 1 tab po BID;  -Will add Bisacodyl 10 mg DailyPRN suppository  -If no bowel movement by tomorrow may need an Enema  Normocytic Anemia/IDA superimposed on Postoperative acute blood loss anemia -Patient's Hgb/Hct went from 11.5/34.7 -> 10.7/32.6 -> 11.3/34.3 -> 12.0/36.9 -> 9.6/29.0 -> 9.5/28.4 -> 9.5/29.6 and likely Post-Opertative Drop and Blood Loss along with some component of dilution  -Checked Anemia Panel and showed an iron level of 21, U IBC of 235, TIBC 256, saturation ratios of 8%, ferritin level 170, folate level 8.5, and vitamin B12 level of 133 -We will start supplementation Niferex 150 mg p.o. daily as well as given a 1000 mcg injection of cyanocobalamin yesterday and  start p.o. cyanocobalamin 1000 mcg today  -Continue to Monitor for S/Sx of Bleeding; Currently no overt bleeding noted -Repeat CBC intermittently now that Hgb/Hct appears stable   Hyponatremia -Mild at 133 and now improved to 140 on last check  -IVF with NS + 20 mEQ KCL at 75 mL/hr now discontinued  -Continue to Monitor and Trend  BMP intermittently   Obesity -Complicates overall prognosis and care -Estimated body mass index is 30.41 kg/m as calculated from the following:   Height as of this encounter: '5\' 8"'$  (1.727 m).   Weight as of this encounter: 90.7 kg. -Weight Loss and Dietary Counseling given   DVT prophylaxis: Enoxaparin 40 mg sq q24h; SCDs Code Status: FULL CODE Family Communication: No family present at bedside  Disposition Plan: Pending further clinical improvement and clearance by Orthopedic Surgery and evaluation by PT/OT; PT/OT Recommending CIR but patient looking into going home with Home Health if she does not elect for CIR  Status is: Inpatient  Remains inpatient appropriate because:Unsafe d/c plan, IV treatments appropriate due to intensity of illness or inability to take PO, and Inpatient level of care appropriate due to severity of illness  Dispo: The patient is from: Home  Anticipated d/c is to:  TBD              Patient currently is not medically stable to d/c.   Difficult to place patient No   Consultants:  Orthopedic Surgery General Surgery (Trauma Consult)  Procedures:  OPEN REDUCTION INTERNAL FIXATION (ORIF) ANKLE FRACTURE (Right) OPEN REDUCTION INTERNAL (ORIF) FIXATION PATELLA (Left)  Antimicrobials:  Anti-infectives (From admission, onward)    Start     Dose/Rate Route Frequency Ordered Stop   07/01/21 1615  ceFAZolin (ANCEF) IVPB 2g/100 mL premix        2 g 200 mL/hr over 30 Minutes Intravenous Every 8 hours 07/01/21 1518 07/02/21 0637   07/01/21 1305  vancomycin (VANCOCIN) powder  Status:  Discontinued          As needed  07/01/21 1317 07/01/21 1346   07/01/21 0800  ceFAZolin (ANCEF) IVPB 2g/100 mL premix  Status:  Discontinued        2 g 200 mL/hr over 30 Minutes Intravenous On call to O.R. 07/01/21 0701 07/01/21 1515       Subjective: Seen and examined at bedside and states that she started getting a headache this AM but wasn't too bad. Still hasn't had a bowel movement yet. States she has some pain in her ankle and knee. No Nausea or vomiting and denies any chest pain or SOB. Hoping to work with therapy again today. Indicates that she will likely need to go to CIR for rehab as she doesn't think she can manage at home. No other concerns or complaints at this time.   Objective: Vitals:   07/04/21 0843 07/04/21 1501 07/04/21 2145 07/05/21 0800  BP: 124/64 128/65 127/64 126/64  Pulse: 77 100 96 83  Resp: '16 16 17 18  '$ Temp: 98.2 F (36.8 C) 98.2 F (36.8 C) 99.9 F (37.7 C) 98.6 F (37 C)  TempSrc: Oral Oral Oral Oral  SpO2: 98% 96% 100% 98%  Weight:      Height:        Intake/Output Summary (Last 24 hours) at 07/05/2021 0830 Last data filed at 07/05/2021 [id number] Gross per 24 hour  Intake 360 ml  Output --  Net 360 ml    Filed Weights   07/01/21 0700 07/01/21 0933  Weight: 93.4 kg 90.7 kg   Examination: Physical Exam:  Constitutional: WN/WD obese Caucasian female in NAD appears calm but a little uncomfortable  Respiratory: Slightly diminished at the bases but unlabored breathing with no Wheezing/Rales/Rhonchi or crackles.   Cardiovascular: RRR. No appreciable m/r/g.  MSK: Right foot in a Boot and Left Knee Immobilized in a KI/Hinge brace locked in full extension Abdomen: Soft, NT, Distended 2/2 to body habitus; Bowel sounds present Neurologic: CN 2-12 grossly intact; No appreciable focal deficits  Psychiatric: Awake and Alert and Oriented x3; Normal mood and affect   Data Reviewed: I have personally reviewed following labs and imaging studies  CBC: Recent Labs  Lab 06/30/21 0305  07/01/21 0818 07/02/21 0241 07/03/21 0335 07/04/21 0250  WBC 8.9 8.9 10.0 12.4* 8.8  NEUTROABS  --  5.8 8.8* 9.5* 5.2  HGB 11.3* 12.0 9.6* 9.5* 9.5*  HCT 34.3* 36.9 29.0* 28.4* 29.6*  MCV 94.8 93.9 94.2 95.0 95.5  PLT 303 337 291 [id number]    Basic Metabolic Panel: Recent Labs  Lab 07/01/21 0818 07/02/21 0241 07/03/21 0335 07/04/21 0250  NA 138 137 140  --   K 4.3 4.4 3.8  --   CL 102 104 107  --  CO2 '28 27 26  '$ --   GLUCOSE 113* 155* 107*  --   BUN '15 14 14  '$ --   CREATININE 0.72 0.67 0.79  --   CALCIUM 9.1 8.9 8.4*  --   MG 1.9 2.0 1.8 1.8  PHOS 3.9 2.5 3.0  --     GFR: Estimated Creatinine Clearance: 84.8 mL/min (by C-G formula based on SCr of 0.79 mg/dL). Liver Function Tests: Recent Labs  Lab 07/01/21 0818 07/02/21 0241 07/03/21 0335  AST '22 20 18  '$ ALT '20 16 14  '$ ALKPHOS 40 35* 40  BILITOT 0.5 0.4 0.3  PROT 6.7 6.0* 5.6*  ALBUMIN 3.5 3.0* 2.8*    No results for input(s): LIPASE, AMYLASE in the last 168 hours. No results for input(s): AMMONIA in the last 168 hours. Coagulation Profile: No results for input(s): INR, PROTIME in the last 168 hours. Cardiac Enzymes: No results for input(s): CKTOTAL, CKMB, CKMBINDEX, TROPONINI in the last 168 hours. BNP (last 3 results) No results for input(s): PROBNP in the last 8760 hours. HbA1C: No results for input(s): HGBA1C in the last 72 hours.  CBG: No results for input(s): GLUCAP in the last 168 hours. Lipid Profile: No results for input(s): CHOL, HDL, LDLCALC, TRIG, CHOLHDL, LDLDIRECT in the last 72 hours. Thyroid Function Tests: No results for input(s): TSH, T4TOTAL, FREET4, T3FREE, THYROIDAB in the last 72 hours. Anemia Panel: No results for input(s): VITAMINB12, FOLATE, FERRITIN, TIBC, IRON, RETICCTPCT in the last 72 hours.  Sepsis Labs: No results for input(s): PROCALCITON, LATICACIDVEN in the last 168 hours.  Recent Results (from the past 240 hour(s))  Resp Panel by RT-PCR (Flu A&B, Covid)  Nasopharyngeal Swab     Status: None   Collection Time: 06/26/21  9:13 PM   Specimen: Nasopharyngeal Swab; Nasopharyngeal(NP) swabs in vial transport medium  Result Value Ref Range Status   SARS Coronavirus 2 by RT PCR NEGATIVE NEGATIVE Final    Comment: (NOTE) SARS-CoV-2 target nucleic acids are NOT DETECTED.  The SARS-CoV-2 RNA is generally detectable in upper respiratory specimens during the acute phase of infection. The lowest concentration of SARS-CoV-2 viral copies this assay can detect is 138 copies/mL. A negative result does not preclude SARS-Cov-2 infection and should not be used as the sole basis for treatment or other patient management decisions. A negative result may occur with  improper specimen collection/handling, submission of specimen other than nasopharyngeal swab, presence of viral mutation(s) within the areas targeted by this assay, and inadequate number of viral copies(<138 copies/mL). A negative result must be combined with clinical observations, patient history, and epidemiological information. The expected result is Negative.  Fact Sheet for Patients:  EntrepreneurPulse.com.au  Fact Sheet for Healthcare Providers:  IncredibleEmployment.be  This test is no t yet approved or cleared by the [place] FDA and  has been authorized for detection and/or diagnosis of SARS-CoV-2 by FDA under an Emergency Use Authorization (EUA). This EUA will remain  in effect (meaning this test can be used) for the duration of the COVID-19 declaration under Section 564(b)(1) of the Act, 21 U.S.C.section 360bbb-3(b)(1), unless the authorization is terminated  or revoked sooner.       Influenza A by PCR NEGATIVE NEGATIVE Final   Influenza B by PCR NEGATIVE NEGATIVE Final    Comment: (NOTE) The Xpert Xpress SARS-CoV-2/FLU/RSV plus assay is intended as an aid in the diagnosis of influenza from Nasopharyngeal swab specimens and should not be  used as a sole basis for treatment. Nasal washings and aspirates  are unacceptable for Xpert Xpress SARS-CoV-2/FLU/RSV testing.  Fact Sheet for Patients: EntrepreneurPulse.com.au  Fact Sheet for Healthcare Providers: IncredibleEmployment.be  This test is not yet approved or cleared by the [place] FDA and has been authorized for detection and/or diagnosis of SARS-CoV-2 by FDA under an Emergency Use Authorization (EUA). This EUA will remain in effect (meaning this test can be used) for the duration of the COVID-19 declaration under Section 564(b)(1) of the Act, 21 U.S.C. section 360bbb-3(b)(1), unless the authorization is terminated or revoked.  Performed at [facility], [place] [address], [place], [place] [address]   Surgical pcr screen     Status: None   Collection Time: 06/27/21  8:20 PM   Specimen: Nasal Mucosa; Nasal Swab  Result Value Ref Range Status   MRSA, PCR NEGATIVE NEGATIVE Final   Staphylococcus aureus NEGATIVE NEGATIVE Final    Comment: (NOTE) The Xpert SA Assay (FDA approved for NASAL specimens in patients 28 years of age and older), is one component of a comprehensive surveillance program. It is not intended to diagnose infection nor to guide or monitor treatment. Performed at [facility], [place] [address], [place], [place] [address]     RN Pressure Injury Documentation:   Estimated body mass index is 30.41 kg/m as calculated from the following:   Height as of this encounter: '5\' 8"'$  (1.727 m).   Weight as of this encounter: 90.7 kg.  Malnutrition Type:   Malnutrition Characteristics:   Nutrition Interventions:     Radiology Studies: No results found.  Scheduled Meds:  cholecalciferol  2,000 Units Oral Daily   docusate sodium  100 mg Oral BID   iron polysaccharides  150 mg Oral Daily   vitamin B-12  1,000 mcg Oral Daily   Continuous Infusions:  methocarbamol (ROBAXIN) IV      LOS: 8 days    Kerney Elbe, DO [facility] PAGER is on AMION  If 7PM-7AM, please contact night-coverage [email or web address]

## 2021-07-06 DIAGNOSIS — R739 Hyperglycemia, unspecified: Secondary | ICD-10-CM

## 2021-07-06 DIAGNOSIS — S82201A Unspecified fracture of shaft of right tibia, initial encounter for closed fracture: Secondary | ICD-10-CM | POA: Diagnosis not present

## 2021-07-06 DIAGNOSIS — S82002A Unspecified fracture of left patella, initial encounter for closed fracture: Secondary | ICD-10-CM | POA: Diagnosis not present

## 2021-07-06 DIAGNOSIS — E538 Deficiency of other specified B group vitamins: Secondary | ICD-10-CM

## 2021-07-06 DIAGNOSIS — T7840XA Allergy, unspecified, initial encounter: Secondary | ICD-10-CM | POA: Diagnosis not present

## 2021-07-06 DIAGNOSIS — D72829 Elevated white blood cell count, unspecified: Secondary | ICD-10-CM | POA: Diagnosis not present

## 2021-07-06 MED ORDER — POLYSACCHARIDE IRON COMPLEX 150 MG PO CAPS: 150.0000 mg | ORAL_CAPSULE | Freq: Every day | ORAL | 0 refills | Status: DC

## 2021-07-06 MED ORDER — ONDANSETRON HCL 4 MG PO TABS: 4.0000 mg | ORAL_TABLET | Freq: Four times a day (QID) | ORAL | 0 refills | Status: DC | PRN

## 2021-07-06 MED ORDER — METHOCARBAMOL 500 MG PO TABS: 500.0000 mg | ORAL_TABLET | Freq: Four times a day (QID) | ORAL | Status: DC | PRN

## 2021-07-06 MED ORDER — CYANOCOBALAMIN 1000 MCG PO TABS: 1000.0000 ug | ORAL_TABLET | Freq: Every day | ORAL | Status: AC

## 2021-07-06 MED ORDER — SENNOSIDES-DOCUSATE SODIUM 8.6-50 MG PO TABS: 1.0000 | ORAL_TABLET | Freq: Two times a day (BID) | ORAL | Status: DC

## 2021-07-06 MED ORDER — POLYETHYLENE GLYCOL 3350 17 G PO PACK: 17.0000 g | PACK | Freq: Two times a day (BID) | ORAL | 0 refills | Status: DC

## 2021-07-06 MED ORDER — VITAMIN D3 25 MCG PO TABS
2000.0000 [IU] | ORAL_TABLET | Freq: Every day | ORAL | 0 refills | Status: DC
Start: 2021-07-07 — End: 2021-07-23

## 2021-07-06 MED ORDER — HYDROCODONE-ACETAMINOPHEN 5-325 MG PO TABS: 1.0000 | ORAL_TABLET | Freq: Four times a day (QID) | ORAL | 0 refills | Status: DC | PRN

## 2021-07-06 NOTE — Discharge Summary (Signed)
Physician Discharge Summary  Courtney Berg [id number] DOB: May 29, 1956 DOA: 06/26/2021  PCP: Susy Frizzle, MD  Admit date: 06/26/2021 Discharge date: 07/06/2021  Admitted From: Home Disposition: CIR  Recommendations for Outpatient Follow-up:  Follow up with PCP in 1-2 weeks Follow up with Orthopedic Surgery within 1-2 weeks Please obtain CMP/CBC, Mag, Phos in one week Please follow up on the following pending results:  Home Health: No  Equipment/Devices: None   Discharge Condition: Stable CODE STATUS: FULL CODE Diet recommendation: Regular Diet   Brief/Interim Summary: The patient is a 65 year old obese Caucasian female with a past medical history significant for but not limited to history of allergies as well as other comorbidities who presented to ED after fall.  She fell downstairs at her work and she landed on her left knee and also noted to have significant right ankle pain and deformity.  Left knee showed patellar fracture and right ankle and foot x-ray showed distal tibia and fibular fracture as well as dislocated ankle.  She was admitted for her trimalleolar ankle fracture and left patellar fracture and orthopedic surgery was consulted.  Orthopedic surgery is planning on taking the patient for surgical intervention for ORIF of the Right Ankle and the Left Knee.  PT and OT evaluated and recommending Inpatient Rehab but patient evaluating options of possibly going home with Home Health if she does not elect to go to CIR. Currently she is now leaning toward going to CIR given her lack of mobility and pain.   Discharge Diagnoses:  Principal Problem:   Tibia/fibula fracture, right, closed, initial encounter Active Problems:   Allergies   Left patella fracture    Right Trimalleolar Ankle Fracture s/p ORIF on 07/01/21 -Had a Mechanical Fall approximately down 3 stairs afer her heel slipped -DG Ankle showed "Interval reduction of fractures and talar dislocation." -CT Ankle  showed "Complex comminuted spiral type fracture involving the distal fibular shaft at and above the level of the ankle mortise. Oblique coursing comminuted longitudinal intra-articular fracture involving the posterior malleolus of the tibia. Maximum posterior displacement is 7 mm. Maximum gap at the articular surface is 5 mm. Small impaction type fracture involving the medial and anterior aspect of the talar dome. Slight widening of the medial mortise and a few tiny bony densities near the talus. Possible deltoid ligament injury."  -C/w Pain Control per orthopedic surgery as delineated with Acetaminophen 325-650 mg every 6hprn Mild Pain for pain 1-3, Robaxin 500 mg po/IV every 6 as needed Muscle Spasms, Hydrocodone-Acetaminophen 5-325 mg (1-2 Tab) and 7.5-325 mg (1-2 Tab) every 4 as needed,  as well as Hydromorphone 0.5-1 mg every 4 as needed -Vitamin D level was 19.11 so she is will be started on vitamin D3 supplementation with Cholecalciferol 2,000 units po Daily   -Orthopedics planning ORIF and contacting Orthopedic Trau[facility]tologists for definitive care  -Surgery on Wednesday 07/01/21 by Dr. Doreatha Martin and she is status post ORIF right ankle fracture -She has now been ordered for a right lower extremity CAM walker -Orthopedic recommending nonweightbearing of the right lower extremity but recommending okay for right ankle range of motion as tolerated at rest -We will need a bowel regimen -Orthopedic removed dressings last week  -PT/OT recommending CIR but patient considering home with Home Health with 24/7 Care if it can be arranged but now patient feels as if the best option would be going to CIR first and then going home; Medically stable to D/C to CIR   Hypotension, improved  -Mild and  Asymptomatic. Patient states normally her BP's run on the softer sider but haven't been this low -Given a bolus of 500 mL on 07/02/21; mIVF now discontinued  -Last blood pressure reading was 121/71 this AM -Continue to  Monitor BP per Protocol    Left Patella Fracture s/p ORIF 07/01/21 -DG Left Knee showed "There are changes consistent with a transverse fracture through the patella. There is distraction of the fracture fragments of at least 3 cm. Fat fluid effusion is noted between the fracture  fragments. Additionally on the frontal film there is fragmentation of the more superior fragment identified" -Left leg in knee immobilizer -Surgery today on Wednesday 07/01/21 and she underwent ORIF of the left patella -Orthopedic surgery recommending weightbearing as tolerated in the left lower extremity with AKI/hinged brace locked in full extension which has still yet to be delivered but has been ordered  -Pain control as above   Acute Urinary Retention -Bladder scan obtained the day before yesterday showed 800 cc urine -Likely induced by opioids -Continue in-and-out cath every 6 hours as needed -Improving and continue to Monitor -Up and Out of Bed    Leukocytosis -Likely Reactive in the setting of Pain and Surgery -WBC went from 7.8 -> 8.9 -> 8.9 -> 10.0 -> 12.4 -> 8.8 on last check  -Patient is afebrile -Continue to Monitor for S/Sx of Infection -Repeat CBC intermittently now that Leukocytosis has resolved   Hyperglycemia in setting of Prediabetes -Patient's Blood Sugar on Admission was 151 on CMP and was 107 on last CMP check; ranging from 107-155 daily BMP/CMP's -Continue to Monitor Blood Sugars per Protocol and if Necessary will place on Sensitive Novolog SSI AC -Check HbA1c is 5.8   Constipation, improved  -Has not had a bowel movement in over a 1 week but finally had one last night  -Will change Bowel Regimen from Docusate 100 mg po BID and Miralax 17 gram dailyprn to Scheduled Miralax 17 gram po BID and Senna-Docusate 1 tab po BID;  -Will add Bisacodyl 10 mg DailyPRN suppository  -If no bowel movement by tomorrow may need an Enema   Normocytic Anemia/IDA superimposed on Postoperative acute blood  loss anemia -Patient's Hgb/Hct went from 11.5/34.7 -> 10.7/32.6 -> 11.3/34.3 -> 12.0/36.9 -> 9.6/29.0 -> 9.5/28.4 -> 9.5/29.6 and likely Post-Opertative Drop and Blood Loss along with some component of dilution  -Checked Anemia Panel and showed an iron level of 21, U IBC of 235, TIBC 256, saturation ratios of 8%, ferritin level 170, folate level 8.5, and vitamin B12 level of 133 -We will start supplementation Niferex 150 mg p.o. daily as well as given a 1000 mcg injection of cyanocobalamin yesterday and start p.o. cyanocobalamin 1000 mcg today  -Continue to Monitor for S/Sx of Bleeding; Currently no overt bleeding noted -Repeat CBC intermittently now that Hgb/Hct appears stable    Hyponatremia -Mild at 133 and now improved to 140 on last check  -IVF with NS + 20 mEQ KCL at 75 mL/hr now discontinued  -Continue to Monitor and Trend  BMP intermittently    Obesity -Complicates overall prognosis and care -Estimated body mass index is 30.41 kg/m as calculated from the following:   Height as of this encounter: '5\' 8"'$  (1.727 m).   Weight as of this encounter: 90.7 kg. -Weight Loss and Dietary Counseling given   Discharge Instructions  Discharge Instructions     Call MD for:  difficulty breathing, headache or visual disturbances   Complete by: As directed    Call MD  for:  extreme fatigue   Complete by: As directed    Call MD for:  hives   Complete by: As directed    Call MD for:  persistant dizziness or light-headedness   Complete by: As directed    Call MD for:  persistant nausea and vomiting   Complete by: As directed    Call MD for:  redness, tenderness, or signs of infection (pain, swelling, redness, odor or green/yellow discharge around incision site)   Complete by: As directed    Call MD for:  severe uncontrolled pain   Complete by: As directed    Call MD for:  temperature >100.4   Complete by: As directed    Diet - low sodium heart healthy   Complete by: As directed     Discharge instructions   Complete by: As directed    You were cared for by a hospitalist during your hospital stay. If you have any questions about your discharge medications or the care you received while you were in the hospital after you are discharged, you can call the unit and ask to speak with the hospitalist on call if the hospitalist that took care of you is not available. Once you are discharged, your primary care physician will handle any further medical issues. Please note that NO REFILLS for any discharge medications will be authorized once you are discharged, as it is imperative that you return to your primary care physician (or establish a relationship with a primary care physician if you do not have one) for your aftercare needs so that they can reassess your need for medications and monitor your lab values.  Follow up with PCP and Orthopedic Surgery. Take all medications as prescribed. If symptoms change or worsen please return to the ED for evaluation   Discharge wound care:   Complete by: As directed    Reinforce Dressing   Increase activity slowly   Complete by: As directed       Allergies as of 07/06/2021   No Known Allergies      Medication List     STOP taking these medications    ibuprofen 200 MG tablet Commonly known as: ADVIL   Pfizer-BioNTech COVID-19 Vacc 30 MCG/0.3ML injection Generic drug: COVID-19 mRNA vaccine (Pfizer)       TAKE these medications    acetaminophen 325 MG tablet Commonly known as: TYLENOL Take 650 mg by mouth every 6 (six) hours as needed for moderate pain or headache.   cyanocobalamin 1000 MCG tablet Take 1 tablet (1,000 mcg total) by mouth daily. Start taking on: July 07, 2021   HYDROcodone-acetaminophen 5-325 MG tablet Commonly known as: NORCO/VICODIN Take 1-2 tablets by mouth every 6 (six) hours as needed for moderate pain (pain score 4-6).   iron polysaccharides 150 MG capsule Commonly known as: NIFEREX Take 1 capsule  (150 mg total) by mouth daily. Start taking on: July 07, 2021   methocarbamol 500 MG tablet Commonly known as: ROBAXIN Take 1 tablet (500 mg total) by mouth every 6 (six) hours as needed for muscle spasms.   ondansetron 4 MG tablet Commonly known as: ZOFRAN Take 1 tablet (4 mg total) by mouth every 6 (six) hours as needed for nausea.   polyethylene glycol 17 g packet Commonly known as: MIRALAX / GLYCOLAX Take 17 g by mouth 2 (two) times daily.   senna-docusate 8.6-50 MG tablet Commonly known as: Senokot-S Take 1 tablet by mouth 2 (two) times daily.   Vitamin D3 25  MCG tablet Commonly known as: Vitamin D Take 2 tablets (2,000 Units total) by mouth daily. Start taking on: July 07, 2021               Discharge Care Instructions  (From admission, onward)           Start     Ordered   07/06/21 0000  Discharge wound care:       Comments: Reinforce Dressing   07/06/21 0916            No Known Allergies  Consultations: Orthopedic Surgery Physiatry   Procedures/Studies: DG Pelvis 1-2 Views  Result Date: 06/26/2021 CLINICAL DATA:  Fall EXAM: PELVIS - 1-2 VIEW COMPARISON:  None. FINDINGS: No fracture or malalignment. Pubic symphysis and rami appear intact. Mild degenerative changes of both hips. IMPRESSION: No acute osseous abnormality. Electronically Signed   By: Donavan Foil M.D.   On: 06/26/2021 20:44   DG Knee 1-2 Views Left  Result Date: 07/01/2021 CLINICAL DATA:  Two heart surgery. ORIF right ankle and ORIF left patella. EXAM: DG C-ARM 1-60 MIN; RIGHT ANKLE - COMPLETE 3+ VIEW; LEFT KNEE - 1-2 VIEW FLUOROSCOPY TIME:  Fluoroscopy Time:  1 minute, 4 seconds Radiation Exposure Index (if provided by the fluoroscopic device): 1.84 mGy. Number of Acquired Spot Images: 21 COMPARISON:  06/26/2021. FINDINGS: Twenty-one total C-arm fluoroscopic images were obtained intraoperatively and submitted for post operative interpretation. The ankle fluoroscopy demonstrates  plate screw fixation distal fibula, single screw fixation through the distal tibia, and tight rope syndesmotic repair. Improved alignment, near anatomic. The patellar fluoroscopy demonstrates plate and screw fixation of the patellar fracture with improved, near anatomic alignment. No unexpected findings. Expected soft tissue swelling and gas. Please see the performing provider's procedural report for further detail. IMPRESSION: Intraoperative fluoroscopy during ankle and patellar surgery. Electronically Signed   By: Margaretha Sheffield MD   On: 07/01/2021 14:33   DG Tibia/Fibula Right  Result Date: 06/26/2021 CLINICAL DATA:  Fall EXAM: RIGHT TIBIA AND FIBULA - 2 VIEW COMPARISON:  None. FINDINGS: Proximal tibia and fibula appear intact. Acute comminuted and displaced distal fibular fracture. Acute displaced posterior malleolar fracture. Posterior dislocation of the talus IMPRESSION: Acute fracture dislocation at the ankle. Proximal tibia and fibula appear intact. Electronically Signed   By: Donavan Foil M.D.   On: 06/26/2021 20:49   DG Ankle 2 Views Right  Result Date: 06/26/2021 CLINICAL DATA:  Status post reduction and casting EXAM: RIGHT ANKLE - 2 VIEW COMPARISON:  Films from earlier in the same day. FINDINGS: The fracture fragments have been reduced and the posterior talar dislocation has also been reduced. Splinting material is noted. IMPRESSION: Interval reduction of fractures and talar dislocation. Electronically Signed   By: Inez Catalina M.D.   On: 06/26/2021 22:35   DG Ankle 2 Views Right  Result Date: 06/26/2021 CLINICAL DATA:  Fall EXAM: RIGHT ANKLE - 2 VIEW COMPARISON:  None. FINDINGS: Acute comminuted fracture involving distal shaft of the fibula with slightly greater than 1 shaft diameter posterior displacement of distal fracture fragment. There is overriding. Acute displaced posterior malleolar fracture. Posterior dislocation of the talus with respect to the distal tibia. IMPRESSION: Acute  comminuted distal tibial and fibular fractures with posterior dislocation of the talus with respect to the distal tibia Electronically Signed   By: Donavan Foil M.D.   On: 06/26/2021 20:46   DG Ankle Complete Right  Result Date: 07/01/2021 CLINICAL DATA:  Two heart surgery. ORIF right ankle and ORIF  left patella. EXAM: DG C-ARM 1-60 MIN; RIGHT ANKLE - COMPLETE 3+ VIEW; LEFT KNEE - 1-2 VIEW FLUOROSCOPY TIME:  Fluoroscopy Time:  1 minute, 4 seconds Radiation Exposure Index (if provided by the fluoroscopic device): 1.84 mGy. Number of Acquired Spot Images: 21 COMPARISON:  06/26/2021. FINDINGS: Twenty-one total C-arm fluoroscopic images were obtained intraoperatively and submitted for post operative interpretation. The ankle fluoroscopy demonstrates plate screw fixation distal fibula, single screw fixation through the distal tibia, and tight rope syndesmotic repair. Improved alignment, near anatomic. The patellar fluoroscopy demonstrates plate and screw fixation of the patellar fracture with improved, near anatomic alignment. No unexpected findings. Expected soft tissue swelling and gas. Please see the performing provider's procedural report for further detail. IMPRESSION: Intraoperative fluoroscopy during ankle and patellar surgery. Electronically Signed   By: Margaretha Sheffield MD   On: 07/01/2021 14:33   CT ANKLE RIGHT WO CONTRAST  Result Date: 06/27/2021 CLINICAL DATA:  Complex ankle fractures. EXAM: CT OF THE RIGHT ANKLE WITHOUT CONTRAST TECHNIQUE: Multidetector CT imaging of the right ankle was performed according to the standard protocol. Multiplanar CT image reconstructions were also generated. COMPARISON:  Radiographs 06/26/2021 FINDINGS: Complex comminuted spiral type fracture involving the distal fibular shaft at and above the level of the ankle mortise. Maximum posterior displacement of 11 mm. Oblique coursing comminuted longitudinal intra-articular fracture involving the posterior malleolus of the  tibia. Maximum posterior displacement is 7 mm. Maximum gap at the articular surface is 5 mm. The medial malleolus is intact. The medial mortise is slightly widened and a few tiny bony densities are noted near the talus. Possible deltoid ligament injury. There is a small impaction type fracture involving the medial and anterior aspect of the talar dome. The subtalar joints are maintained. The sinus tarsi is unremarkable. No fracture of the calcaneus or midfoot bony structures. Mild midfoot degenerative changes are noted. Grossly by CT the medial and lateral and anterior ankle tendons are intact. The Achilles tendon is intact. There is a moderate-sized calcaneal heel spur noted. Reading remarkable running IMPRESSION: 1. Complex comminuted spiral type fracture involving the distal fibular shaft at and above the level of the ankle mortise. 2. Oblique coursing comminuted longitudinal intra-articular fracture involving the posterior malleolus of the tibia. Maximum posterior displacement is 7 mm. Maximum gap at the articular surface is 5 mm. 3. Small impaction type fracture involving the medial and anterior aspect of the talar dome. 4. Slight widening of the medial mortise and a few tiny bony densities near the talus. Possible deltoid ligament injury. Electronically Signed   By: Marijo Sanes M.D.   On: 06/27/2021 09:57   DG Chest Port 1 View  Result Date: 06/27/2021 CLINICAL DATA:  Respiratory failure, preoperative EXAM: PORTABLE CHEST 1 VIEW COMPARISON:  CT 01/24/2018 FINDINGS: Streaky opacities towards the lung bases favoring atelectatic change. No consolidative process or convincing features of edema. The cardiomediastinal contours are unremarkable. No acute osseous or soft tissue abnormality. Degenerative changes are present in the imaged spine and shoulders. Telemetry leads overlie the chest. IMPRESSION: Streaky opacities towards the lung bases, favoring atelectasis. No other acute cardiopulmonary abnormality.  Electronically Signed   By: Lovena Le M.D.   On: 06/27/2021 01:34   DG Knee Left Port  Result Date: 07/01/2021 CLINICAL DATA:  Status post ORIF left patella EXAM: PORTABLE LEFT KNEE - 1-2 VIEW COMPARISON:  June 26, 2021 FINDINGS: Interval plate and screw fixation of the comminuted patellar fracture which appears in anatomic alignment. Postsurgical changes evident with a joint  effusion and subcutaneous edema/gas. IMPRESSION: Postsurgical change of ORIF of comminuted patellar fracture. Electronically Signed   By: Dahlia Bailiff MD   On: 07/01/2021 16:36   DG Knee Left Port  Result Date: 06/26/2021 CLINICAL DATA:  Recent fall with knee pain, initial encounter EXAM: PORTABLE LEFT KNEE - 1-2 VIEW COMPARISON:  None. FINDINGS: There are changes consistent with a transverse fracture through the patella. There is distraction of the fracture fragments of at least 3 cm. Fat fluid effusion is noted between the fracture fragments. Additionally on the frontal film there is fragmentation of the more superior fragment identified. IMPRESSION: Comminuted patellar fracture with associated joint effusion. Electronically Signed   By: Inez Catalina M.D.   On: 06/26/2021 22:01   DG Ankle Right Port  Result Date: 07/01/2021 CLINICAL DATA:  Status post ORIF EXAM: PORTABLE RIGHT ANKLE - 2 VIEW COMPARISON:  06/26/2021 FINDINGS: Interval surgical plate and multiple screw fixation of the distal fibula across comminuted distal fibular fracture. Fixating screw at the distal tibia with cortical anchor medially at the distal tibia. Near anatomic alignment. Ankle mortise symmetric. IMPRESSION: Interval surgical fixation of distal tibial and fibular fractures Electronically Signed   By: Donavan Foil M.D.   On: 07/01/2021 16:36   DG Foot 2 Views Right  Result Date: 06/26/2021 CLINICAL DATA:  Tripping going downstairs EXAM: RIGHT FOOT - 2 VIEW COMPARISON:  Contemporary tibia/fibular radiographs FINDINGS: Supra syndesmotic distal  fibular fracture with posterior displacement and angulation and mild comminution. Posterior superiorly displaced fracture of the posterior malleolus. No clear medial malleolar fracture. Lateral talar shift and talar tilting is noted compatible with ankle instability. Large ankle joint effusion. Extensive circumferential soft tissue swelling. Background of mild degenerative changes as well as chronic features suggesting some degree of synostosis of the distal tibiofibular articulation. Bidirectional calcaneal spurring is noted. 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 Associated effusion circumferential ankle swelling. Electronically Signed   By: Lovena Le M.D.   On: 06/26/2021 21:29   DG C-Arm 1-60 Min  Result Date: 07/01/2021 CLINICAL DATA:  Two heart surgery. ORIF right ankle and ORIF left patella. EXAM: DG C-ARM 1-60 MIN; RIGHT ANKLE - COMPLETE 3+ VIEW; LEFT KNEE - 1-2 VIEW FLUOROSCOPY TIME:  Fluoroscopy Time:  1 minute, 4 seconds Radiation Exposure Index (if provided by the fluoroscopic device): 1.84 mGy. Number of Acquired Spot Images: 21 COMPARISON:  06/26/2021. FINDINGS: Twenty-one total C-arm fluoroscopic images were obtained intraoperatively and submitted for post operative interpretation. The ankle fluoroscopy demonstrates plate screw fixation distal fibula, single screw fixation through the distal tibia, and tight rope syndesmotic repair. Improved alignment, near anatomic. The patellar fluoroscopy demonstrates plate and screw fixation of the patellar fracture with improved, near anatomic alignment. No unexpected findings. Expected soft tissue swelling and gas. Please see the performing provider's procedural report for further detail. IMPRESSION: Intraoperative fluoroscopy during ankle and patellar surgery. Electronically Signed   By: Margaretha Sheffield MD   On:  07/01/2021 14:33   DG Femur Min 2 Views Right  Result Date: 06/26/2021 CLINICAL DATA:  Fall EXAM: RIGHT FEMUR 2 VIEWS COMPARISON:  None. FINDINGS: There is no evidence of fracture or other focal bone lesions. Soft tissues are unremarkable. IMPRESSION: Negative. Electronically Signed   By: Donavan Foil M.D.   On: 06/26/2021 20:46     Subjective: Seen and examined at bedside and finally had a bowel  movement last night and stated it hurt so she is resting now. States her ankle was throbbing about a 2 or 3 in severity. No CP or SOB. No other concerns or complaints and is medically stable to got to CIR.   Discharge Exam: Vitals:   07/05/21 2223 07/06/21 0728  BP: 108/72 121/71  Pulse: 99 86  Resp: 17 15  Temp: 100 F (37.8 C) 98.6 F (37 C)  SpO2: 97% 95%   Vitals:   07/05/21 0800 07/05/21 1434 07/05/21 2223 07/06/21 0728  BP: 126/64 (!) 111/53 108/72 121/71  Pulse: 83 84 99 86  Resp: '18 18 17 15  '$ Temp: 98.6 F (37 C) 99 F (37.2 C) 100 F (37.8 C) 98.6 F (37 C)  TempSrc: Oral Oral Oral Oral  SpO2: 98% 98% 97% 95%  Weight:      Height:       General: Pt is alert, awake, not in acute distress Cardiovascular: RRR, S1/S2 +, no rubs, no gallops Respiratory: Slightly diminished bilaterally, no wheezing, no rhonchi; Unlabored breathing  Abdominal: Soft, NT, Distended 2/2 body habitus, bowel sounds + Extremities: no edema, no cyanosis; Right Ankle ina boot and and Left Knee in Knee Imobilizer locked in full extension  The results of significant diagnostics from this hospitalization (including imaging, microbiology, ancillary and laboratory) are listed below for reference.    Microbiology: Recent Results (from the past 240 hour(s))  Resp Panel by RT-PCR (Flu A&B, Covid) Nasopharyngeal Swab     Status: None   Collection Time: 06/26/21  9:13 PM   Specimen: Nasopharyngeal Swab; Nasopharyngeal(NP) swabs in vial transport medium  Result Value Ref Range Status   SARS Coronavirus 2 by  RT PCR NEGATIVE NEGATIVE Final    Comment: (NOTE) SARS-CoV-2 target nucleic acids are NOT DETECTED.  The SARS-CoV-2 RNA is generally detectable in upper respiratory specimens during the acute phase of infection. The lowest concentration of SARS-CoV-2 viral copies this assay can detect is 138 copies/mL. A negative result does not preclude SARS-Cov-2 infection and should not be used as the sole basis for treatment or other patient management decisions. A negative result may occur with  improper specimen collection/handling, submission of specimen other than nasopharyngeal swab, presence of viral mutation(s) within the areas targeted by this assay, and inadequate number of viral copies(<138 copies/mL). A negative result must be combined with clinical observations, patient history, and epidemiological information. The expected result is Negative.  Fact Sheet for Patients:  EntrepreneurPulse.com.au  Fact Sheet for Healthcare Providers:  IncredibleEmployment.be  This test is no t yet approved or cleared by the [place] FDA and  has been authorized for detection and/or diagnosis of SARS-CoV-2 by FDA under an Emergency Use Authorization (EUA). This EUA will remain  in effect (meaning this test can be used) for the duration of the COVID-19 declaration under Section 564(b)(1) of the Act, 21 U.S.C.section 360bbb-3(b)(1), unless the authorization is terminated  or revoked sooner.       Influenza A by PCR NEGATIVE NEGATIVE Final   Influenza B by PCR NEGATIVE NEGATIVE Final    Comment: (NOTE) The Xpert Xpress SARS-CoV-2/FLU/RSV plus assay is intended as an aid in the diagnosis of influenza from Nasopharyngeal swab specimens and should not be used as a sole basis for treatment. Nasal washings and aspirates are unacceptable for Xpert Xpress SARS-CoV-2/FLU/RSV testing.  Fact Sheet for Patients: EntrepreneurPulse.com.au  Fact Sheet  for Healthcare Providers: IncredibleEmployment.be  This test is not yet approved or cleared by the [place]  FDA and has been authorized for detection and/or diagnosis of SARS-CoV-2 by FDA under an Emergency Use Authorization (EUA). This EUA will remain in effect (meaning this test can be used) for the duration of the COVID-19 declaration under Section 564(b)(1) of the Act, 21 U.S.C. section 360bbb-3(b)(1), unless the authorization is terminated or revoked.  Performed at [facility], [place] [address], [place], [place] [address]   Surgical pcr screen     Status: None   Collection Time: 06/27/21  8:20 PM   Specimen: Nasal Mucosa; Nasal Swab  Result Value Ref Range Status   MRSA, PCR NEGATIVE NEGATIVE Final   Staphylococcus aureus NEGATIVE NEGATIVE Final    Comment: (NOTE) The Xpert SA Assay (FDA approved for NASAL specimens in patients 52 years of age and older), is one component of a comprehensive surveillance program. It is not intended to diagnose infection nor to guide or monitor treatment. Performed at [facility], [place] [address] [place], [place] [address]     Labs: BNP (last 3 results) No results for input(s): BNP in the last 8760 hours. Basic Metabolic Panel: Recent Labs  Lab 07/01/21 0818 07/02/21 0241 07/03/21 0335 07/04/21 0250  NA 138 137 140  --   K 4.3 4.4 3.8  --   CL 102 104 107  --   CO2 '28 27 26  '$ --   GLUCOSE 113* 155* 107*  --   BUN '15 14 14  '$ --   CREATININE 0.72 0.67 0.79  --   CALCIUM 9.1 8.9 8.4*  --   MG 1.9 2.0 1.8 1.8  PHOS 3.9 2.5 3.0  --    Liver Function Tests: Recent Labs  Lab 07/01/21 0818 07/02/21 0241 07/03/21 0335  AST '22 20 18  '$ ALT '20 16 14  '$ ALKPHOS 40 35* 40  BILITOT 0.5 0.4 0.3  PROT 6.7 6.0* 5.6*  ALBUMIN 3.5 3.0* 2.8*   No results for input(s): LIPASE, AMYLASE in the last 168 hours. No results for input(s): AMMONIA in the last 168 hours. CBC: Recent Labs  Lab 06/30/21 0305  07/01/21 0818 07/02/21 0241 07/03/21 0335 07/04/21 0250  WBC 8.9 8.9 10.0 12.4* 8.8  NEUTROABS  --  5.8 8.8* 9.5* 5.2  HGB 11.3* 12.0 9.6* 9.5* 9.5*  HCT 34.3* 36.9 29.0* 28.4* 29.6*  MCV 94.8 93.9 94.2 95.0 95.5  PLT 303 337 291 319 335   Cardiac Enzymes: No results for input(s): CKTOTAL, CKMB, CKMBINDEX, TROPONINI in the last 168 hours. BNP: Invalid input(s): POCBNP CBG: No results for input(s): GLUCAP in the last 168 hours. D-Dimer No results for input(s): DDIMER in the last 72 hours. Hgb A1c No results for input(s): HGBA1C in the last 72 hours. Lipid Profile No results for input(s): CHOL, HDL, LDLCALC, TRIG, CHOLHDL, LDLDIRECT in the last 72 hours. Thyroid function studies No results for input(s): TSH, T4TOTAL, T3FREE, THYROIDAB in the last 72 hours.  Invalid input(s): FREET3 Anemia work up No results for input(s): VITAMINB12, FOLATE, FERRITIN, TIBC, IRON, RETICCTPCT in the last 72 hours. Urinalysis No results found for: COLORURINE, APPEARANCEUR, [place], [place], [place], [place], [place], [place], PROTEINUR, UROBILINOGEN, NITRITE, LEUKOCYTESUR Sepsis Labs Invalid input(s): PROCALCITONIN,  WBC,  LACTICIDVEN Microbiology Recent Results (from the past 240 hour(s))  Resp Panel by RT-PCR (Flu A&B, Covid) Nasopharyngeal Swab     Status: None   Collection Time: 06/26/21  9:13 PM   Specimen: Nasopharyngeal Swab; Nasopharyngeal(NP) swabs in vial transport medium  Result Value Ref Range Status   SARS Coronavirus  2 by RT PCR NEGATIVE NEGATIVE Final    Comment: (NOTE) SARS-CoV-2 target nucleic acids are NOT DETECTED.  The SARS-CoV-2 RNA is generally detectable in upper respiratory specimens during the acute phase of infection. The lowest concentration of SARS-CoV-2 viral copies this assay can detect is 138 copies/mL. A negative result does not preclude SARS-Cov-2 infection and should not be used as the sole basis for treatment or other patient management decisions. A  negative result may occur with  improper specimen collection/handling, submission of specimen other than nasopharyngeal swab, presence of viral mutation(s) within the areas targeted by this assay, and inadequate number of viral copies(<138 copies/mL). A negative result must be combined with clinical observations, patient history, and epidemiological information. The expected result is Negative.  Fact Sheet for Patients:  EntrepreneurPulse.com.au  Fact Sheet for Healthcare Providers:  IncredibleEmployment.be  This test is no t yet approved or cleared by the [place] FDA and  has been authorized for detection and/or diagnosis of SARS-CoV-2 by FDA under an Emergency Use Authorization (EUA). This EUA will remain  in effect (meaning this test can be used) for the duration of the COVID-19 declaration under Section 564(b)(1) of the Act, 21 U.S.C.section 360bbb-3(b)(1), unless the authorization is terminated  or revoked sooner.       Influenza A by PCR NEGATIVE NEGATIVE Final   Influenza B by PCR NEGATIVE NEGATIVE Final    Comment: (NOTE) The Xpert Xpress SARS-CoV-2/FLU/RSV plus assay is intended as an aid in the diagnosis of influenza from Nasopharyngeal swab specimens and should not be used as a sole basis for treatment. Nasal washings and aspirates are unacceptable for Xpert Xpress SARS-CoV-2/FLU/RSV testing.  Fact Sheet for Patients: EntrepreneurPulse.com.au  Fact Sheet for Healthcare Providers: IncredibleEmployment.be  This test is not yet approved or cleared by the [place] FDA and has been authorized for detection and/or diagnosis of SARS-CoV-2 by FDA under an Emergency Use Authorization (EUA). This EUA will remain in effect (meaning this test can be used) for the duration of the COVID-19 declaration under Section 564(b)(1) of the Act, 21 U.S.C. section 360bbb-3(b)(1), unless the authorization  is terminated or revoked.  Performed at [facility], [place] [address] [place], [place] [address]   Surgical pcr screen     Status: None   Collection Time: 06/27/21  8:20 PM   Specimen: Nasal Mucosa; Nasal Swab  Result Value Ref Range Status   MRSA, PCR NEGATIVE NEGATIVE Final   Staphylococcus aureus NEGATIVE NEGATIVE Final    Comment: (NOTE) The Xpert SA Assay (FDA approved for NASAL specimens in patients 56 years of age and older), is one component of a comprehensive surveillance program. It is not intended to diagnose infection nor to guide or monitor treatment. Performed at [facility], [place] [address] [place], [place] [address]    Time coordinating discharge: 35 minutes  SIGNED:  Kerney Elbe, DO [facility] 07/06/2021, 9:16 AM Pager is on [place]  If 7PM-7AM, please contact night-coverage [email or web address]

## 2021-07-06 NOTE — Progress Notes (Signed)
Physical Therapy Treatment Patient Details Name: Courtney Berg MRN: [id number] DOB: [birth year] Today's Date: 07/06/2021    History of Present Illness The pt is a 65 year old female who presented to the ED 8/5 after fall downstairs at work, left knee x-ray showed patella fracture, right ankle and foot x-ray showed distal tibia and fibula fracture and dislocated ankle. Pt now s/p ORIF of R ankle and L patella 8/10. PMH includes diabetes, hypertension, cancer.    PT Comments    Pt supine in bed on arrival.  She has had a busy day but was agreeable to PT session.  Able to progress to standing and take x 2 steps with knee walker before c/o dizziness and feeling faint required PTA to assist patient back to bed.  Pt remains to be an excellent candidate for rehab in a post acute setting.  Will plan for orthostatic vitals as she did not tolerate standing well due to dizziness.    Follow Up Recommendations  CIR     Equipment Recommendations  Wheelchair (measurements PT);Wheelchair cushion (measurements PT) (knee scooter)    Recommendations for Other Services Rehab consult     Precautions / Restrictions Precautions Precautions: Fall Required Braces or Orthoses: Other Brace (L knee bledsoe locked in extension.) Other Brace: CAM walker boot RLE-when OOB- can be off in bed. Restrictions Weight Bearing Restrictions: Yes RLE Weight Bearing: Non weight bearing (but allowed to weight bear through R knee on platform of knee walker per PA on 07/06/21) LLE Weight Bearing: Weight bearing as tolerated (in locked bledsoe) Other Position/Activity Restrictions: pt is allowed gentle ROM to R ankle out of CAM boot when at rest with leg supported    Mobility  Bed Mobility Overal bed mobility: Needs Assistance Bed Mobility: Rolling;Supine to Sit     Supine to sit: Mod assist     General bed mobility comments: Mod assistance to advance LLE to edge of bed.    Transfers Overall transfer level: Needs  assistance Equipment used:  (knee walker.) Transfers: Sit to/from Stand Sit to Stand: Max assist;+2 physical assistance;+2 safety/equipment;From elevated surface         General transfer comment: Cues for hand placement and had to place R leg across knee walker to rise into standing as L knee is locked in extension with heavy assistance to pull into standing.  Once in standing she started to feel dizzy as gt progressed and required +2 assistance for back to bed.  Ambulation/Gait Ambulation/Gait assistance: Mod assist;+2 physical assistance Gait Distance (Feet): 4 Feet (gt limited due to dizziness.) Assistive device:  (knee walker) Gait Pattern/deviations: Step-to pattern     General Gait Details: Pt performed x 2 steps forward until she became increasingly dizzy and required assistance to return back to bed.   Stairs             Wheelchair Mobility    Modified Rankin (Stroke Patients Only)       Balance Overall balance assessment: Needs assistance;History of Falls   Sitting balance-Leahy Scale: Good       Standing balance-Leahy Scale: Poor                              Cognition Arousal/Alertness: Awake/alert Behavior During Therapy: WFL for tasks assessed/performed Overall Cognitive Status: Within Functional Limits for tasks assessed  General Comments: pt pleasant and following all cues approrpiately      Exercises General Exercises - Lower Extremity Ankle Circles/Pumps: AROM;Both;10 reps;Supine (ankle circles x 10 B ankles.) Quad Sets: AROM;Right;Supine;5 reps Heel Slides: AROM;Right;5 reps;Supine Hip ABduction/ADduction: AAROM;Right;5 reps;Supine Straight Leg Raises: AAROM;Right;5 reps;Supine    General Comments        Pertinent Vitals/Pain Pain Assessment: Faces Faces Pain Scale: Hurts little more Pain Location: L knee Pain Descriptors / Indicators:  Discomfort;Sore;Grimacing;Guarding Pain Intervention(s): Monitored during session;Repositioned    Home Living                      Prior Function            PT Goals (current goals can now be found in the care plan section) Acute Rehab PT Goals Patient Stated Goal: return to independence Potential to Achieve Goals: Good Additional Goals Additional Goal #1: Pt will propel wheelchair with BUE's x 150 feet modI Progress towards PT goals: Progressing toward goals    Frequency    Min 5X/week      PT Plan Current plan remains appropriate    Co-evaluation              AM-PAC PT "6 Clicks" Mobility   Outcome Measure                   End of Session Equipment Utilized During Treatment: Gait belt (L knee bledsoe and R CAM walker.) Activity Tolerance: Patient limited by fatigue;Patient limited by pain Patient left: in bed;with call bell/phone within reach;with bed alarm set Nurse Communication: Mobility status PT Visit Diagnosis: Other abnormalities of gait and mobility (R26.89);Muscle weakness (generalized) (M62.81);Pain;Unsteadiness on feet (R26.81) Pain - Right/Left: Left Pain - part of body: Knee     Time: [id number] PT Time Calculation (min) (ACUTE ONLY): 33 min  Charges:  $Therapeutic Activity: 23-37 mins                     Erasmo Leventhal , PTA Acute Rehabilitation Services Pager [phone] Office [phone]    Blake Goya Eli Hose 07/06/2021, 2:52 PM

## 2021-07-06 NOTE — Progress Notes (Signed)
PT Cancellation Note  Patient Details Name: CAMIRYN SCHROEN MRN: [id number] DOB: 07/01/1956   Cancelled Treatment:    Reason Eval/Treat Not Completed: (P) Other (comment) (Pt just finished OT session and needs a rest break before PT session.  Will f/u later this pm.)   Birtie Fellman Eli Hose 07/06/2021, 10:10 AM  Erasmo Leventhal , PTA Acute Rehabilitation Services Pager [phone] Office [phone]

## 2021-07-06 NOTE — Progress Notes (Signed)
Occupational Therapy Treatment Patient Details Name: Courtney Berg MRN: [id number] DOB: 28-Jul-1956 Today's Date: 07/06/2021    History of present illness The pt is a 65 year old female who presented to the ED 8/5 after fall downstairs at work, left knee x-ray showed patella fracture, right ankle and foot x-ray showed distal tibia and fibula fracture and dislocated ankle. Pt now s/p ORIF of R ankle and L patella 8/10. PMH includes diabetes, hypertension, cancer.   OT comments  Courtney Berg is progressing well with hopes to go to CIR soon to progress function in ADLs and mobility prior to d/c home. Session focused on EOB bathing and functional transfers. Pt sponge bathed with min A over all, and was set up A for grooming at the end of the session. She required multiple rest breaks throughout the session due to postural fatigue and pain. She completed 1 posterior transfer with mod A from bed>chair. She continues to benefit from OT acutely. Recommend d/c to CIR for intensive therapies to increase indep in all ADLs and mobility.   Follow Up Recommendations  CIR    Equipment Recommendations  Other (comment) (Defer to CIR)    Recommendations for Other Services Rehab consult    Precautions / Restrictions Precautions Precautions: Fall Required Braces or Orthoses: Knee Immobilizer - Left;Other Brace Knee Immobilizer - Left: On at all times Other Brace: CAM walker boot RLE Restrictions Weight Bearing Restrictions: Yes RLE Weight Bearing: Non weight bearing LLE Weight Bearing: Weight bearing as tolerated       Mobility Bed Mobility Overal bed mobility: Needs Assistance Bed Mobility: Rolling;Supine to Sit Rolling: Min guard   Supine to sit: Min assist     General bed mobility comments: Min A for management of LLE    Transfers Overall transfer level: Needs assistance   Transfers: Comptroller transfers: Mod assist   General transfer comment:  mod A to assist pt with scooting backwards into the chair with use of bed pad    Balance Overall balance assessment: Needs assistance;History of Falls Sitting-balance support: Feet supported;Bilateral upper extremity supported Sitting balance-Leahy Scale: Good                                     ADL either performed or assessed with clinical judgement   ADL Overall ADL's : Needs assistance/impaired         Upper Body Bathing: Set up;Sitting   Lower Body Bathing: Minimal assistance;Sitting/lateral leans Lower Body Bathing Details (indicate cue type and reason): min A for rear peri area in sidelying         Toilet Transfer: Total assistance Toilet Transfer Details (indicate cue type and reason): Pt on bed pan upon arrival, otherwise max A for posterior transfer from bed>BSC         Functional mobility during ADLs: Moderate assistance (posterior transfer this session only) General ADL Comments: Pt completed toileting needs on BSC upon arrival, and ultimately required min-mod A for all other bathing needs while sitting EOB. Pt needed several rest breaks throughout session      Cognition Arousal/Alertness: Awake/alert Behavior During Therapy: WFL for tasks assessed/performed Overall Cognitive Status: Within Functional Limits for tasks assessed     General Comments: pt pleasant and following all cues approrpiately              General Comments No new concerns, pt on bed pan and  preparing for bath with RN upon arrival    Pertinent Vitals/ Pain       Pain Assessment: Faces Faces Pain Scale: Hurts little more Pain Location: L knee Pain Descriptors / Indicators: Discomfort;Sore;Grimacing;Guarding Pain Intervention(s): Limited activity within patient's tolerance;Monitored during session   Frequency  Min 2X/week        Progress Toward Goals  OT Goals(current goals can now be found in the care plan section)  Progress towards OT goals: Progressing  toward goals  Acute Rehab OT Goals Patient Stated Goal: return to independence OT Goal Formulation: With patient Time For Goal Achievement: 07/16/21 Potential to Achieve Goals: Good ADL Goals Pt Will Perform Lower Body Bathing: with min assist;sitting/lateral leans;sit to/from stand Pt Will Perform Lower Body Dressing: with min assist;sitting/lateral leans;sit to/from stand Pt Will Transfer to Toilet: with min assist;stand pivot transfer Pt Will Perform Toileting - Clothing Manipulation and hygiene: with min assist;sitting/lateral leans;sit to/from stand  Plan Discharge plan remains appropriate;Frequency remains appropriate       AM-PAC OT "6 Clicks" Daily Activity     Outcome Measure   Help from another person eating meals?: None Help from another person taking care of personal grooming?: A Little Help from another person toileting, which includes using toliet, bedpan, or urinal?: A Lot Help from another person bathing (including washing, rinsing, drying)?: A Lot Help from another person to put on and taking off regular upper body clothing?: A Little Help from another person to put on and taking off regular lower body clothing?: A Lot 6 Click Score: 16    End of Session Equipment Utilized During Treatment: Left knee immobilizer (R CAM)  OT Visit Diagnosis: Unsteadiness on feet (R26.81);Other abnormalities of gait and mobility (R26.89);Muscle weakness (generalized) (M62.81);Pain Pain - Right/Left: Left Pain - part of body: Knee   Activity Tolerance Patient tolerated treatment well;Patient limited by pain   Patient Left in chair;with call bell/phone within reach (set up for grooming)   Nurse Communication Mobility status        Time: [id number] OT Time Calculation (min): 24 min  Charges: OT General Charges $OT Visit: 1 Visit OT Treatments $Self Care/Home Management : 23-37 mins    Rylee Nuzum A Wylie Coon 07/06/2021, 10:22 AM

## 2021-07-06 NOTE — Plan of Care (Signed)
  Problem: Activity: Goal: Ability to increase mobility will improve Outcome: Progressing   Problem: Physical Regulation: Goal: Postoperative complications will be avoided or minimized Outcome: Progressing

## 2021-07-06 NOTE — Plan of Care (Signed)
  Problem: Activity: Goal: Ability to increase mobility will improve Outcome: Progressing   Problem: Pain Management: Goal: Pain level will decrease with appropriate interventions Outcome: Progressing   Problem: Skin Integrity: Goal: Will show signs of wound healing Outcome: Progressing

## 2021-07-06 NOTE — Progress Notes (Signed)
Inpatient Rehab Admissions Coordinator:   Met with patient at the bedside.  She is interested in pursuing short term CIR stay prior to d/c home.  I will start insurance authorization today.   Shann Medal, PT, DPT Admissions Coordinator [phone] 07/06/21  11:57 AM

## 2021-07-07 DIAGNOSIS — I951 Orthostatic hypotension: Secondary | ICD-10-CM

## 2021-07-07 DIAGNOSIS — S9304XA Dislocation of right ankle joint, initial encounter: Secondary | ICD-10-CM

## 2021-07-07 DIAGNOSIS — S82201A Unspecified fracture of shaft of right tibia, initial encounter for closed fracture: Secondary | ICD-10-CM

## 2021-07-07 DIAGNOSIS — S82002A Unspecified fracture of left patella, initial encounter for closed fracture: Secondary | ICD-10-CM | POA: Diagnosis not present

## 2021-07-07 DIAGNOSIS — W19XXXA Unspecified fall, initial encounter: Secondary | ICD-10-CM

## 2021-07-07 DIAGNOSIS — T7840XA Allergy, unspecified, initial encounter: Secondary | ICD-10-CM | POA: Diagnosis not present

## 2021-07-07 DIAGNOSIS — Z419 Encounter for procedure for purposes other than remedying health state, unspecified: Secondary | ICD-10-CM

## 2021-07-07 LAB — CBC WITH DIFFERENTIAL/PLATELET
Abs Immature Granulocytes: 0.07 10*3/uL (ref 0.00–0.07)
Basophils Absolute: 0.1 10*3/uL (ref 0.0–0.1)
Basophils Relative: 1 %
Eosinophils Absolute: 0.2 10*3/uL (ref 0.0–0.5)
Eosinophils Relative: 2 %
HCT: 32 % — ABNORMAL LOW (ref 36.0–46.0)
Hemoglobin: 10.7 g/dL — ABNORMAL LOW (ref 12.0–15.0)
Immature Granulocytes: 1 %
Lymphocytes Relative: 18 %
Lymphs Abs: 1.9 10*3/uL (ref 0.7–4.0)
MCH: 31.2 pg (ref 26.0–34.0)
MCHC: 33.4 g/dL (ref 30.0–36.0)
MCV: 93.3 fL (ref 80.0–100.0)
Monocytes Absolute: 1.2 10*3/uL — ABNORMAL HIGH (ref 0.1–1.0)
Monocytes Relative: 11 %
Neutro Abs: 7.1 10*3/uL (ref 1.7–7.7)
Neutrophils Relative %: 67 %
Platelets: 384 10*3/uL (ref 150–400)
RBC: 3.43 MIL/uL — ABNORMAL LOW (ref 3.87–5.11)
RDW: 13.5 % (ref 11.5–15.5)
WBC: 10.6 10*3/uL — ABNORMAL HIGH (ref 4.0–10.5)
nRBC: 0 % (ref 0.0–0.2)

## 2021-07-07 MED ORDER — SODIUM CHLORIDE 0.9 % IV BOLUS
1000.0000 mL | Freq: Once | INTRAVENOUS | Status: AC
  Administered 2021-07-07: 1000 mL via INTRAVENOUS

## 2021-07-07 MED ORDER — SODIUM CHLORIDE 0.9 % IV SOLN
2.0000 g | INTRAVENOUS | Status: DC
  Administered 2021-07-08: 2 g via INTRAVENOUS
  Filled 2021-07-07: qty 20

## 2021-07-07 MED ORDER — SODIUM CHLORIDE 0.9 % IV BOLUS
500.0000 mL | Freq: Once | INTRAVENOUS | Status: AC
  Administered 2021-07-07: 500 mL via INTRAVENOUS

## 2021-07-07 NOTE — Progress Notes (Addendum)
    OVERNIGHT PROGRESS REPORT  Notified by RN for concern over signs symptoms of extremity associated with recent surgery. Explained as red and warm to the touch by RN. Floor coverage at [facility] will address in person at this time.    Update: Orders are in progress post [facility] Floor coverage visit.    Gershon Cull MSNA MSN ACNPC-AG Acute Care Nurse Practitioner [place]

## 2021-07-07 NOTE — Progress Notes (Signed)
Physical Therapy Treatment Patient Details Name: Courtney Berg MRN: [id number] DOB: 1956/05/20 Today's Date: 07/07/2021    History of Present Illness The pt is a 65 year old female who presented to the ED 8/5 after fall downstairs at work, left knee x-ray showed patella fracture, right ankle and foot x-ray showed distal tibia and fibula fracture and dislocated ankle. Pt now s/p ORIF of R ankle and L patella 8/10. PMH includes diabetes, hypertension, cancer.    PT Comments    Pt supine in bed on arrival this session.  Performed orthostatic vitals as she became very dizzy during standing and short gt trial yesterday but unable to obtain vitals during session as she was returned to bed quickly.  This session she presents with significant drop in BP during standing trial.  She required mod +2 to rise into standing.  Pt remains an excellent candidate for aggressive rehab at [facility] to medically manage her orthostasis during recovery.  Will continue to follow.    Orthostatic vitals:  Lying: 119/82 Sitting: 133/57 Standing: 111/63 Standing after 3 min: 87/55 Reclined in chair after return to sitting with feet elevated:136/86       Follow Up Recommendations  [facility]     Equipment Recommendations  Wheelchair (measurements PT);Wheelchair cushion (measurements PT)    Recommendations for Other Services       Precautions / Restrictions Precautions Precautions: Fall Required Braces or Orthoses: Other Brace (L knee bledsoe locked in extension.) Knee Immobilizer - Left: On at all times Other Brace: CAM walker boot RLE-when OOB- can be off in bed. Restrictions Weight Bearing Restrictions: Yes RLE Weight Bearing: Non weight bearing (able to bear weight on R knee on platfrom of knee scooter) LLE Weight Bearing: Weight bearing as tolerated Other Position/Activity Restrictions: pt is allowed gentle ROM to R ankle out of CAM boot when at rest with leg supported    Mobility  Bed Mobility Overal  bed mobility: Needs Assistance Bed Mobility: Supine to Sit     Supine to sit: Min assist     General bed mobility comments: Used gt belt attached to bledsoe brace to move LLE to edge of bed.    Transfers Overall transfer level: Needs assistance Equipment used:  (knee walker.) Transfers: Sit to/from Stand Sit to Stand: Mod assist;+2 physical assistance;From elevated surface         General transfer comment: Mod +2 to rise into standing and transition R knee onto platform to achieve standing.  Cues for scap retraction and upper trunk control.  Performed orthostatic and she was symptomatic and unable to progress gt this session due to dizziness and significant drop in BP.  Ambulation/Gait Ambulation/Gait assistance:  (unable due to soft BP and need to return to sitting.)               Stairs             Wheelchair Mobility    Modified Rankin (Stroke Patients Only)       Balance     Sitting balance-Leahy Scale: Good       Standing balance-Leahy Scale: Poor                              Cognition Arousal/Alertness: Awake/alert Behavior During Therapy: WFL for tasks assessed/performed Overall Cognitive Status: Within Functional Limits for tasks assessed  General Comments: pt pleasant and following all cues approrpiately      Exercises      General Comments        Pertinent Vitals/Pain Pain Assessment: Faces Faces Pain Scale: Hurts even more Pain Location: L knee and R ankle Pain Descriptors / Indicators: Discomfort;Sore;Grimacing;Guarding Pain Intervention(s): Monitored during session;Repositioned;Ice applied    Home Living                      Prior Function            PT Goals (current goals can now be found in the care plan section) Acute Rehab PT Goals Patient Stated Goal: return to independence Potential to Achieve Goals: Good Additional Goals Additional Goal #1:  Pt will propel wheelchair with BUE's x 150 feet modI Progress towards PT goals: Progressing toward goals    Frequency    Min 5X/week      PT Plan Current plan remains appropriate    Co-evaluation              AM-PAC PT "6 Clicks" Mobility   Outcome Measure  Help needed turning from your back to your side while in a flat bed without using bedrails?: A Little Help needed moving from lying on your back to sitting on the side of a flat bed without using bedrails?: A Little Help needed moving to and from a bed to a chair (including a wheelchair)?: A Lot Help needed standing up from a chair using your arms (e.g., wheelchair or bedside chair)?: Total Help needed to walk in hospital room?: Total Help needed climbing 3-5 steps with a railing? : Total 6 Click Score: 11    End of Session Equipment Utilized During Treatment: Gait belt Activity Tolerance: Patient limited by fatigue;Patient limited by pain Patient left: in bed;with call bell/phone within reach;with bed alarm set Nurse Communication: Mobility status PT Visit Diagnosis: Other abnormalities of gait and mobility (R26.89);Muscle weakness (generalized) (M62.81);Pain;Unsteadiness on feet (R26.81) Pain - Right/Left: Left Pain - part of body: Knee     Time: 1109-1140 PT Time Calculation (min) (ACUTE ONLY): 31 min  Charges:  $Therapeutic Activity: 23-37 mins                     Erasmo Leventhal , PTA Acute Rehabilitation Services Pager [phone] Office [phone]    Abdullah Rizzi Eli Hose 07/07/2021, 11:47 AM

## 2021-07-07 NOTE — Plan of Care (Signed)
  Problem: Activity: Goal: Ability to increase mobility will improve Outcome: Progressing   Problem: Physical Regulation: Goal: Postoperative complications will be avoided or minimized Outcome: Progressing   Problem: Pain Management: Goal: Pain level will decrease with appropriate interventions Outcome: Progressing   Problem: Skin Integrity: Goal: Will show signs of wound healing Outcome: Progressing

## 2021-07-07 NOTE — Plan of Care (Signed)
  Problem: Activity: Goal: Ability to increase mobility will improve Outcome: Progressing   Problem: Physical Regulation: Goal: Postoperative complications will be avoided or minimized Outcome: Progressing   Problem: Pain Management: Goal: Pain level will decrease with appropriate interventions Outcome: Progressing

## 2021-07-07 NOTE — Discharge Summary (Addendum)
Physician Discharge Summary  Courtney Berg [id number] DOB: [birth year] DOA: 06/26/2021  PCP: Susy Frizzle, MD  Admit date: 06/26/2021 Discharge date: 07/07/2021  Admitted From: Home Disposition: CIR  Recommendations for Outpatient Follow-up:  Follow up with PCP in 1-2 weeks Follow up with Orthopedic Surgery within 1-2 weeks Please obtain CMP/CBC, Mag, Phos in one week Please follow up on the following pending results:  Home Health: No  Equipment/Devices: None   Discharge Condition: Stable CODE STATUS: FULL CODE Diet recommendation: Regular Diet   Brief/Interim Summary: The patient is a 65 year old obese Caucasian female with a past medical history significant for but not limited to history of allergies as well as other comorbidities who presented to ED after fall.  She fell downstairs at her work and she landed on her left knee and also noted to have significant right ankle pain and deformity.  Left knee showed patellar fracture and right ankle and foot x-ray showed distal tibia and fibular fracture as well as dislocated ankle.  She was admitted for her trimalleolar ankle fracture and left patellar fracture and orthopedic surgery was consulted.  Orthopedic surgery is planning on taking the patient for surgical intervention for ORIF of the Right Ankle and the Left Knee.  PT and OT evaluated and recommending Inpatient Rehab but patient evaluating options of possibly going home with Home Health if she does not elect to go to CIR. Currently she is now leaning toward going to CIR given her lack of mobility and pain. Medically stable to D/C   ADDENDUM 07/07/21: Unfortunately Insurance Authorization was not started until yesterday; Remains medically stable to D/C once insurance authorization is obtained.   Discharge Diagnoses:  Principal Problem:   Tibia/fibula fracture, right, closed, initial encounter Active Problems:   Allergies   Left patella fracture  Right Trimalleolar Ankle  Fracture s/p ORIF on 07/01/21 -Had a Mechanical Fall approximately down 3 stairs afer her heel slipped -DG Ankle showed "Interval reduction of fractures and talar dislocation." -CT Ankle showed "Complex comminuted spiral type fracture involving the distal fibular shaft at and above the level of the ankle mortise. Oblique coursing comminuted longitudinal intra-articular fracture involving the posterior malleolus of the tibia. Maximum posterior displacement is 7 mm. Maximum gap at the articular surface is 5 mm. Small impaction type fracture involving the medial and anterior aspect of the talar dome. Slight widening of the medial mortise and a few tiny bony densities near the talus. Possible deltoid ligament injury."  -C/w Pain Control per orthopedic surgery as delineated with Acetaminophen 325-650 mg every 6hprn Mild Pain for pain 1-3, Robaxin 500 mg po/IV every 6 as needed Muscle Spasms, Hydrocodone-Acetaminophen 5-325 mg (1-2 Tab) and 7.5-325 mg (1-2 Tab) every 4 as needed,  as well as Hydromorphone 0.5-1 mg every 4 as needed -Vitamin D level was 19.11 so she is will be started on vitamin D3 supplementation with Cholecalciferol 2,000 units po Daily   -Orthopedics planning ORIF and contacting Orthopedic Trauamtologists for definitive care  -Surgery on Wednesday 07/01/21 by Dr. Doreatha Martin and she is status post ORIF right ankle fracture -She has now been ordered for a right lower extremity CAM walker -Orthopedic recommending nonweightbearing of the right lower extremity but recommending okay for right ankle range of motion as tolerated at rest -We will need a bowel regimen -Orthopedic removed dressings last week  -PT/OT recommending CIR but patient considering home with Home Health with 24/7 Care if it can be arranged but now patient feels as if the best  option would be going to CIR first and then going home; Medically stable to D/C to CIR   Hypotension, improved  -Mild and Asymptomatic. Patient states  normally her BP's run on the softer sider but haven't been this low -Given a bolus of 500 mL on 07/02/21; mIVF now discontinued  -Last blood pressure reading was 110/61 this AM -Continue to Monitor BP per Protocol    Left Patella Fracture s/p ORIF 07/01/21 -DG Left Knee showed "There are changes consistent with a transverse fracture through the patella. There is distraction of the fracture fragments of at least 3 cm. Fat fluid effusion is noted between the fracture  fragments. Additionally on the frontal film there is fragmentation of the more superior fragment identified" -Left leg in knee immobilizer -Surgery today on Wednesday 07/01/21 and she underwent ORIF of the left patella -Orthopedic surgery recommending weightbearing as tolerated in the left lower extremity with AKI/hinged brace locked in full extension which has still yet to be delivered but has been ordered  -Pain control as above   Acute Urinary Retention -Bladder scan obtained the day before yesterday showed 800 cc urine -Likely induced by opioids -Continue in-and-out cath every 6 hours as needed -Improving and continue to Monitor -Up and Out of Bed    Leukocytosis -Likely Reactive in the setting of Pain and Surgery -WBC went from 7.8 -> 8.9 -> 8.9 -> 10.0 -> 12.4 -> 8.8 on last check  -Patient is afebrile -Continue to Monitor for S/Sx of Infection -Repeat CBC intermittently now that Leukocytosis has resolved   Hyperglycemia in setting of Prediabetes -Patient's Blood Sugar on Admission was 151 on CMP and was 107 on last CMP check; ranging from 107-155 daily BMP/CMP's -Continue to Monitor Blood Sugars per Protocol and if Necessary will place on Sensitive Novolog SSI AC -Check HbA1c is 5.8   Constipation, improved  -Has not had a bowel movement in over a 1 week but finally had one the night before last as well as last night  -Will change Bowel Regimen from Docusate 100 mg po BID and Miralax 17 gram dailyprn to Scheduled  Miralax 17 gram po BID and Senna-Docusate 1 tab po BID;  -Will add Bisacodyl 10 mg DailyPRN suppository  -If no bowel movement by tomorrow may need an Enema   Normocytic Anemia/IDA superimposed on Postoperative acute blood loss anemia -Patient's Hgb/Hct went from 11.5/34.7 -> 10.7/32.6 -> 11.3/34.3 -> 12.0/36.9 -> 9.6/29.0 -> 9.5/28.4 -> 9.5/29.6 and likely Post-Opertative Drop and Blood Loss along with some component of dilution  -Checked Anemia Panel and showed an iron level of 21, U IBC of 235, TIBC 256, saturation ratios of 8%, ferritin level 170, folate level 8.5, and vitamin B12 level of 133 -We will start supplementation Niferex 150 mg p.o. daily as well as given a 1000 mcg injection of cyanocobalamin yesterday and start p.o. cyanocobalamin 1000 mcg today  -Continue to Monitor for S/Sx of Bleeding; Currently no overt bleeding noted -Repeat CBC intermittently now that Hgb/Hct appears stable    Hyponatremia -Mild at 133 and now improved to 140 on last check  -IVF with NS + 20 mEQ KCL at 75 mL/hr now discontinued  -Continue to Monitor and Trend  BMP intermittently    Obesity -Complicates overall prognosis and care -Estimated body mass index is 30.41 kg/m as calculated from the following:   Height as of this encounter: '5\' 8"'$  (1.727 m).   Weight as of this encounter: 90.7 kg. -Weight Loss and Dietary Counseling given  Discharge Instructions  Discharge Instructions     Call MD for:  difficulty breathing, headache or visual disturbances   Complete by: As directed    Call MD for:  extreme fatigue   Complete by: As directed    Call MD for:  hives   Complete by: As directed    Call MD for:  persistant dizziness or light-headedness   Complete by: As directed    Call MD for:  persistant nausea and vomiting   Complete by: As directed    Call MD for:  redness, tenderness, or signs of infection (pain, swelling, redness, odor or green/yellow discharge around incision site)   Complete  by: As directed    Call MD for:  severe uncontrolled pain   Complete by: As directed    Call MD for:  temperature >100.4   Complete by: As directed    Diet - low sodium heart healthy   Complete by: As directed    Discharge instructions   Complete by: As directed    You were cared for by a hospitalist during your hospital stay. If you have any questions about your discharge medications or the care you received while you were in the hospital after you are discharged, you can call the unit and ask to speak with the hospitalist on call if the hospitalist that took care of you is not available. Once you are discharged, your primary care physician will handle any further medical issues. Please note that NO REFILLS for any discharge medications will be authorized once you are discharged, as it is imperative that you return to your primary care physician (or establish a relationship with a primary care physician if you do not have one) for your aftercare needs so that they can reassess your need for medications and monitor your lab values.  Follow up with PCP and Orthopedic Surgery. Take all medications as prescribed. If symptoms change or worsen please return to the ED for evaluation   Discharge wound care:   Complete by: As directed    Reinforce Dressing   Increase activity slowly   Complete by: As directed       Allergies as of 07/07/2021   No Known Allergies      Medication List     STOP taking these medications    ibuprofen 200 MG tablet Commonly known as: ADVIL   Pfizer-BioNTech COVID-19 Vacc 30 MCG/0.3ML injection Generic drug: COVID-19 mRNA vaccine (Pfizer)       TAKE these medications    acetaminophen 325 MG tablet Commonly known as: TYLENOL Take 650 mg by mouth every 6 (six) hours as needed for moderate pain or headache.   cyanocobalamin 1000 MCG tablet Take 1 tablet (1,000 mcg total) by mouth daily.   HYDROcodone-acetaminophen 5-325 MG tablet Commonly known as:  NORCO/VICODIN Take 1-2 tablets by mouth every 6 (six) hours as needed for moderate pain (pain score 4-6).   iron polysaccharides 150 MG capsule Commonly known as: NIFEREX Take 1 capsule (150 mg total) by mouth daily.   methocarbamol 500 MG tablet Commonly known as: ROBAXIN Take 1 tablet (500 mg total) by mouth every 6 (six) hours as needed for muscle spasms.   ondansetron 4 MG tablet Commonly known as: ZOFRAN Take 1 tablet (4 mg total) by mouth every 6 (six) hours as needed for nausea.   polyethylene glycol 17 g packet Commonly known as: MIRALAX / GLYCOLAX Take 17 g by mouth 2 (two) times daily.   senna-docusate 8.6-50 MG tablet  Commonly known as: Senokot-S Take 1 tablet by mouth 2 (two) times daily.   Vitamin D3 25 MCG tablet Commonly known as: Vitamin D Take 2 tablets (2,000 Units total) by mouth daily.               Discharge Care Instructions  (From admission, onward)           Start     Ordered   07/06/21 0000  Discharge wound care:       Comments: Reinforce Dressing   07/06/21 0916            No Known Allergies  Consultations: Orthopedic Surgery Physiatry   Procedures/Studies: DG Pelvis 1-2 Views  Result Date: 06/26/2021 CLINICAL DATA:  Fall EXAM: PELVIS - 1-2 VIEW COMPARISON:  None. FINDINGS: No fracture or malalignment. Pubic symphysis and rami appear intact. Mild degenerative changes of both hips. IMPRESSION: No acute osseous abnormality. Electronically Signed   By: Donavan Foil M.D.   On: 06/26/2021 20:44   DG Knee 1-2 Views Left  Result Date: 07/01/2021 CLINICAL DATA:  Two heart surgery. ORIF right ankle and ORIF left patella. EXAM: DG C-ARM 1-60 MIN; RIGHT ANKLE - COMPLETE 3+ VIEW; LEFT KNEE - 1-2 VIEW FLUOROSCOPY TIME:  Fluoroscopy Time:  1 minute, 4 seconds Radiation Exposure Index (if provided by the fluoroscopic device): 1.84 mGy. Number of Acquired Spot Images: 21 COMPARISON:  06/26/2021. FINDINGS: Twenty-one total C-arm fluoroscopic  images were obtained intraoperatively and submitted for post operative interpretation. The ankle fluoroscopy demonstrates plate screw fixation distal fibula, single screw fixation through the distal tibia, and tight rope syndesmotic repair. Improved alignment, near anatomic. The patellar fluoroscopy demonstrates plate and screw fixation of the patellar fracture with improved, near anatomic alignment. No unexpected findings. Expected soft tissue swelling and gas. Please see the performing provider's procedural report for further detail. IMPRESSION: Intraoperative fluoroscopy during ankle and patellar surgery. Electronically Signed   By: Margaretha Sheffield MD   On: 07/01/2021 14:33   DG Tibia/Fibula Right  Result Date: 06/26/2021 CLINICAL DATA:  Fall EXAM: RIGHT TIBIA AND FIBULA - 2 VIEW COMPARISON:  None. FINDINGS: Proximal tibia and fibula appear intact. Acute comminuted and displaced distal fibular fracture. Acute displaced posterior malleolar fracture. Posterior dislocation of the talus IMPRESSION: Acute fracture dislocation at the ankle. Proximal tibia and fibula appear intact. Electronically Signed   By: Donavan Foil M.D.   On: 06/26/2021 20:49   DG Ankle 2 Views Right  Result Date: 06/26/2021 CLINICAL DATA:  Status post reduction and casting EXAM: RIGHT ANKLE - 2 VIEW COMPARISON:  Films from earlier in the same day. FINDINGS: The fracture fragments have been reduced and the posterior talar dislocation has also been reduced. Splinting material is noted. IMPRESSION: Interval reduction of fractures and talar dislocation. Electronically Signed   By: Inez Catalina M.D.   On: 06/26/2021 22:35   DG Ankle 2 Views Right  Result Date: 06/26/2021 CLINICAL DATA:  Fall EXAM: RIGHT ANKLE - 2 VIEW COMPARISON:  None. FINDINGS: Acute comminuted fracture involving distal shaft of the fibula with slightly greater than 1 shaft diameter posterior displacement of distal fracture fragment. There is overriding. Acute displaced  posterior malleolar fracture. Posterior dislocation of the talus with respect to the distal tibia. IMPRESSION: Acute comminuted distal tibial and fibular fractures with posterior dislocation of the talus with respect to the distal tibia Electronically Signed   By: Donavan Foil M.D.   On: 06/26/2021 20:46   DG Ankle Complete Right  Result Date:  07/01/2021 CLINICAL DATA:  Two heart surgery. ORIF right ankle and ORIF left patella. EXAM: DG C-ARM 1-60 MIN; RIGHT ANKLE - COMPLETE 3+ VIEW; LEFT KNEE - 1-2 VIEW FLUOROSCOPY TIME:  Fluoroscopy Time:  1 minute, 4 seconds Radiation Exposure Index (if provided by the fluoroscopic device): 1.84 mGy. Number of Acquired Spot Images: 21 COMPARISON:  06/26/2021. FINDINGS: Twenty-one total C-arm fluoroscopic images were obtained intraoperatively and submitted for post operative interpretation. The ankle fluoroscopy demonstrates plate screw fixation distal fibula, single screw fixation through the distal tibia, and tight rope syndesmotic repair. Improved alignment, near anatomic. The patellar fluoroscopy demonstrates plate and screw fixation of the patellar fracture with improved, near anatomic alignment. No unexpected findings. Expected soft tissue swelling and gas. Please see the performing provider's procedural report for further detail. IMPRESSION: Intraoperative fluoroscopy during ankle and patellar surgery. Electronically Signed   By: Margaretha Sheffield MD   On: 07/01/2021 14:33   CT ANKLE RIGHT WO CONTRAST  Result Date: 06/27/2021 CLINICAL DATA:  Complex ankle fractures. EXAM: CT OF THE RIGHT ANKLE WITHOUT CONTRAST TECHNIQUE: Multidetector CT imaging of the right ankle was performed according to the standard protocol. Multiplanar CT image reconstructions were also generated. COMPARISON:  Radiographs 06/26/2021 FINDINGS: Complex comminuted spiral type fracture involving the distal fibular shaft at and above the level of the ankle mortise. Maximum posterior displacement of  11 mm. Oblique coursing comminuted longitudinal intra-articular fracture involving the posterior malleolus of the tibia. Maximum posterior displacement is 7 mm. Maximum gap at the articular surface is 5 mm. The medial malleolus is intact. The medial mortise is slightly widened and a few tiny bony densities are noted near the talus. Possible deltoid ligament injury. There is a small impaction type fracture involving the medial and anterior aspect of the talar dome. The subtalar joints are maintained. The sinus tarsi is unremarkable. No fracture of the calcaneus or midfoot bony structures. Mild midfoot degenerative changes are noted. Grossly by CT the medial and lateral and anterior ankle tendons are intact. The Achilles tendon is intact. There is a moderate-sized calcaneal heel spur noted. Reading remarkable running IMPRESSION: 1. Complex comminuted spiral type fracture involving the distal fibular shaft at and above the level of the ankle mortise. 2. Oblique coursing comminuted longitudinal intra-articular fracture involving the posterior malleolus of the tibia. Maximum posterior displacement is 7 mm. Maximum gap at the articular surface is 5 mm. 3. Small impaction type fracture involving the medial and anterior aspect of the talar dome. 4. Slight widening of the medial mortise and a few tiny bony densities near the talus. Possible deltoid ligament injury. Electronically Signed   By: Marijo Sanes M.D.   On: 06/27/2021 09:57   DG Chest Port 1 View  Result Date: 06/27/2021 CLINICAL DATA:  Respiratory failure, preoperative EXAM: PORTABLE CHEST 1 VIEW COMPARISON:  CT 01/24/2018 FINDINGS: Streaky opacities towards the lung bases favoring atelectatic change. No consolidative process or convincing features of edema. The cardiomediastinal contours are unremarkable. No acute osseous or soft tissue abnormality. Degenerative changes are present in the imaged spine and shoulders. Telemetry leads overlie the chest.  IMPRESSION: Streaky opacities towards the lung bases, favoring atelectasis. No other acute cardiopulmonary abnormality. Electronically Signed   By: Lovena Le M.D.   On: 06/27/2021 01:34   DG Knee Left Port  Result Date: 07/01/2021 CLINICAL DATA:  Status post ORIF left patella EXAM: PORTABLE LEFT KNEE - 1-2 VIEW COMPARISON:  June 26, 2021 FINDINGS: Interval plate and screw fixation of the comminuted patellar  fracture which appears in anatomic alignment. Postsurgical changes evident with a joint effusion and subcutaneous edema/gas. IMPRESSION: Postsurgical change of ORIF of comminuted patellar fracture. Electronically Signed   By: Dahlia Bailiff MD   On: 07/01/2021 16:36   DG Knee Left Port  Result Date: 06/26/2021 CLINICAL DATA:  Recent fall with knee pain, initial encounter EXAM: PORTABLE LEFT KNEE - 1-2 VIEW COMPARISON:  None. FINDINGS: There are changes consistent with a transverse fracture through the patella. There is distraction of the fracture fragments of at least 3 cm. Fat fluid effusion is noted between the fracture fragments. Additionally on the frontal film there is fragmentation of the more superior fragment identified. IMPRESSION: Comminuted patellar fracture with associated joint effusion. Electronically Signed   By: Inez Catalina M.D.   On: 06/26/2021 22:01   DG Ankle Right Port  Result Date: 07/01/2021 CLINICAL DATA:  Status post ORIF EXAM: PORTABLE RIGHT ANKLE - 2 VIEW COMPARISON:  06/26/2021 FINDINGS: Interval surgical plate and multiple screw fixation of the distal fibula across comminuted distal fibular fracture. Fixating screw at the distal tibia with cortical anchor medially at the distal tibia. Near anatomic alignment. Ankle mortise symmetric. IMPRESSION: Interval surgical fixation of distal tibial and fibular fractures Electronically Signed   By: Donavan Foil M.D.   On: 07/01/2021 16:36   DG Foot 2 Views Right  Result Date: 06/26/2021 CLINICAL DATA:  Tripping going  downstairs EXAM: RIGHT FOOT - 2 VIEW COMPARISON:  Contemporary tibia/fibular radiographs FINDINGS: Supra syndesmotic distal fibular fracture with posterior displacement and angulation and mild comminution. Posterior superiorly displaced fracture of the posterior malleolus. No clear medial malleolar fracture. Lateral talar shift and talar tilting is noted compatible with ankle instability. Large ankle joint effusion. Extensive circumferential soft tissue swelling. Background of mild degenerative changes as well as chronic features suggesting some degree of synostosis of the distal tibiofibular articulation. Bidirectional calcaneal spurring is noted. 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 Associated effusion circumferential ankle swelling. Electronically Signed   By: Lovena Le M.D.   On: 06/26/2021 21:29   DG C-Arm 1-60 Min  Result Date: 07/01/2021 CLINICAL DATA:  Two heart surgery. ORIF right ankle and ORIF left patella. EXAM: DG C-ARM 1-60 MIN; RIGHT ANKLE - COMPLETE 3+ VIEW; LEFT KNEE - 1-2 VIEW FLUOROSCOPY TIME:  Fluoroscopy Time:  1 minute, 4 seconds Radiation Exposure Index (if provided by the fluoroscopic device): 1.84 mGy. Number of Acquired Spot Images: 21 COMPARISON:  06/26/2021. FINDINGS: Twenty-one total C-arm fluoroscopic images were obtained intraoperatively and submitted for post operative interpretation. The ankle fluoroscopy demonstrates plate screw fixation distal fibula, single screw fixation through the distal tibia, and tight rope syndesmotic repair. Improved alignment, near anatomic. The patellar fluoroscopy demonstrates plate and screw fixation of the patellar fracture with improved, near anatomic alignment. No unexpected findings. Expected soft tissue swelling and gas. Please see the performing provider's procedural report for further detail.  IMPRESSION: Intraoperative fluoroscopy during ankle and patellar surgery. Electronically Signed   By: Margaretha Sheffield MD   On: 07/01/2021 14:33   DG Femur Min 2 Views Right  Result Date: 06/26/2021 CLINICAL DATA:  Fall EXAM: RIGHT FEMUR 2 VIEWS COMPARISON:  None. FINDINGS: There is no evidence of fracture or other focal bone lesions. Soft tissues are unremarkable. IMPRESSION: Negative. Electronically Signed   By: Donavan Foil M.D.   On: 06/26/2021 20:46  Subjective: Seen and examined at bedside and finally had a bowel movement last night and stated it hurt so she is resting now. States her ankle was throbbing about a 2 or 3 in severity. No CP or SOB. No other concerns or complaints and is medically stable to got to CIR.   Discharge Exam: Vitals:   07/06/21 2259 07/07/21 0700  BP: 111/65 110/61  Pulse: 82 79  Resp: 16 18  Temp: 98.6 F (37 C) 98.6 F (37 C)  SpO2: 100% 97%   Vitals:   07/06/21 1558 07/06/21 1714 07/06/21 2259 07/07/21 0700  BP: (!) 123/57 (!) 136/47 111/65 110/61  Pulse: 79 60 82 79  Resp: '17 18 16 18  '$ Temp: 98.1 F (36.7 C) 97.8 F (36.6 C) 98.6 F (37 C) 98.6 F (37 C)  TempSrc: Oral Oral  Oral  SpO2: 98% 96% 100% 97%  Weight:      Height:       General: Pt is alert, awake, not in acute distress Cardiovascular: RRR, S1/S2 +, no rubs, no gallops Respiratory: Slightly diminished bilaterally, no wheezing, no rhonchi; Unlabored breathing  Abdominal: Soft, NT, Distended 2/2 body habitus, bowel sounds + Extremities: no edema, no cyanosis; Right Ankle ina boot and and Left Knee in Knee Imobilizer locked in full extension  The results of significant diagnostics from this hospitalization (including imaging, microbiology, ancillary and laboratory) are listed below for reference.    Microbiology: Recent Results (from the past 240 hour(s))  Surgical pcr screen     Status: None   Collection Time: 06/27/21  8:20 PM   Specimen: Nasal Mucosa; Nasal Swab  Result  Value Ref Range Status   MRSA, PCR NEGATIVE NEGATIVE Final   Staphylococcus aureus NEGATIVE NEGATIVE Final    Comment: (NOTE) The Xpert SA Assay (FDA approved for NASAL specimens in patients 32 years of age and older), is one component of a comprehensive surveillance program. It is not intended to diagnose infection nor to guide or monitor treatment. Performed at [facility], [place] [address] [place], [place] [address]     Labs: BNP (last 3 results) No results for input(s): BNP in the last 8760 hours. Basic Metabolic Panel: Recent Labs  Lab 07/01/21 0818 07/02/21 0241 07/03/21 0335 07/04/21 0250  NA 138 137 140  --   K 4.3 4.4 3.8  --   CL 102 104 107  --   CO2 '28 27 26  '$ --   GLUCOSE 113* 155* 107*  --   BUN '15 14 14  '$ --   CREATININE 0.72 0.67 0.79  --   CALCIUM 9.1 8.9 8.4*  --   MG 1.9 2.0 1.8 1.8  PHOS 3.9 2.5 3.0  --     Liver Function Tests: Recent Labs  Lab 07/01/21 0818 07/02/21 0241 07/03/21 0335  AST '22 20 18  '$ ALT '20 16 14  '$ ALKPHOS 40 35* 40  BILITOT 0.5 0.4 0.3  PROT 6.7 6.0* 5.6*  ALBUMIN 3.5 3.0* 2.8*    No results for input(s): LIPASE, AMYLASE in the last 168 hours. No results for input(s): AMMONIA in the last 168 hours. CBC: Recent Labs  Lab 07/01/21 0818 07/02/21 0241 07/03/21 0335 07/04/21 0250  WBC 8.9 10.0 12.4* 8.8  NEUTROABS 5.8 8.8* 9.5* 5.2  HGB 12.0 9.6* 9.5* 9.5*  HCT 36.9 29.0* 28.4* 29.6*  MCV 93.9 94.2 95.0 95.5  PLT 337 291 319 335    Cardiac Enzymes: No results for input(s): CKTOTAL, CKMB, CKMBINDEX,  TROPONINI in the last 168 hours. BNP: Invalid input(s): POCBNP CBG: No results for input(s): GLUCAP in the last 168 hours. D-Dimer No results for input(s): DDIMER in the last 72 hours. Hgb A1c No results for input(s): HGBA1C in the last 72 hours. Lipid Profile No results for input(s): CHOL, HDL, LDLCALC, TRIG, CHOLHDL, LDLDIRECT in the last 72 hours. Thyroid function studies No results for input(s): TSH,  T4TOTAL, T3FREE, THYROIDAB in the last 72 hours.  Invalid input(s): FREET3 Anemia work up No results for input(s): VITAMINB12, FOLATE, FERRITIN, TIBC, IRON, RETICCTPCT in the last 72 hours. Urinalysis No results found for: COLORURINE, APPEARANCEUR, [place], [place], GLUCOSEU, [place], [place], [place], PROTEINUR, UROBILINOGEN, NITRITE, LEUKOCYTESUR Sepsis Labs Invalid input(s): PROCALCITONIN,  WBC,  LACTICIDVEN Microbiology Recent Results (from the past 240 hour(s))  Surgical pcr screen     Status: None   Collection Time: 06/27/21  8:20 PM   Specimen: Nasal Mucosa; Nasal Swab  Result Value Ref Range Status   MRSA, PCR NEGATIVE NEGATIVE Final   Staphylococcus aureus NEGATIVE NEGATIVE Final    Comment: (NOTE) The Xpert SA Assay (FDA approved for NASAL specimens in patients 79 years of age and older), is one component of a comprehensive surveillance program. It is not intended to diagnose infection nor to guide or monitor treatment. Performed at [facility], [place] [address] [place], [place] [address]    Time coordinating discharge: 35 minutes  SIGNED:  Kerney Elbe, DO [facility] 07/07/2021, 8:32 AM Pager is on Deport  If 7PM-7AM, please contact night-coverage [email or web address]

## 2021-07-07 NOTE — Discharge Summary (Addendum)
Physician Discharge Summary  Courtney Berg [id number] DOB: 14-Oct-1956 DOA: 06/26/2021  PCP: Susy Frizzle, MD  Admit date: 06/26/2021 Discharge date: 07/07/2021  Admitted From: Home Disposition: CIR  Recommendations for Outpatient Follow-up:  Follow up with PCP in 1-2 weeks Follow up with Orthopedic Surgery within 1-2 weeks Please obtain CMP/CBC, Mag, Phos in one week Please follow up on the following pending results:  Home Health: No  Equipment/Devices: None   Discharge Condition: Stable CODE STATUS: FULL CODE Diet recommendation: Regular Diet   Brief/Interim Summary: The patient is a 65 year old obese Caucasian female with a past medical history significant for but not limited to history of allergies as well as other comorbidities who presented to ED after fall.  She fell downstairs at her work and she landed on her left knee and also noted to have significant right ankle pain and deformity.  Left knee showed patellar fracture and right ankle and foot x-ray showed distal tibia and fibular fracture as well as dislocated ankle.  She was admitted for her trimalleolar ankle fracture and left patellar fracture and orthopedic surgery was consulted.  Orthopedic surgery is planning on taking the patient for surgical intervention for ORIF of the Right Ankle and the Left Knee.  PT and OT evaluated and recommending Inpatient Rehab but patient evaluating options of possibly going home with Home Health if she does not elect to go to CIR. Currently she is now leaning toward going to CIR given her lack of mobility and pain. Medically stable to D/C   ADDENDUM 07/07/21: Unfortunately Insurance Authorization was not started until yesterday; unfortunately was not able to get insurance authorization for CIR and has been denied.  Will likely need SNF now.  Today she was orthostatic and we will give her 1 L of normal time bolus and repeat orthostatics in the morning.  Discharge Diagnoses:  Principal  Problem:   Tibia/fibula fracture, right, closed, initial encounter Active Problems:   Allergies   Left patella fracture  Right Trimalleolar Ankle Fracture s/p ORIF on 07/01/21 -Had a Mechanical Fall approximately down 3 stairs afer her heel slipped -DG Ankle showed "Interval reduction of fractures and talar dislocation." -CT Ankle showed "Complex comminuted spiral type fracture involving the distal fibular shaft at and above the level of the ankle mortise. Oblique coursing comminuted longitudinal intra-articular fracture involving the posterior malleolus of the tibia. Maximum posterior displacement is 7 mm. Maximum gap at the articular surface is 5 mm. Small impaction type fracture involving the medial and anterior aspect of the talar dome. Slight widening of the medial mortise and a few tiny bony densities near the talus. Possible deltoid ligament injury."  -C/w Pain Control per orthopedic surgery as delineated with Acetaminophen 325-650 mg every 6hprn Mild Pain for pain 1-3, Robaxin 500 mg po/IV every 6 as needed Muscle Spasms, Hydrocodone-Acetaminophen 5-325 mg (1-2 Tab) and 7.5-325 mg (1-2 Tab) every 4 as needed,  as well as Hydromorphone 0.5-1 mg every 4 as needed -Vitamin D level was 19.11 so she is will be started on vitamin D3 supplementation with Cholecalciferol 2,000 units po Daily   -Orthopedics planning ORIF and contacting Orthopedic Trauamtologists for definitive care  -Surgery on Wednesday 07/01/21 by Dr. Doreatha Martin and she is status post ORIF right ankle fracture -She has now been ordered for a right lower extremity CAM walker -Orthopedic recommending nonweightbearing of the right lower extremity but recommending okay for right ankle range of motion as tolerated at rest -We will need a bowel regimen -Orthopedic  removed dressings last week  -PT/OT recommending CIR but patient considering home with Home Health with 24/7 Care if it can be arranged but now patient feels as if the best option  would be going to CIR first and then going home; Medically stable to D/C to CIR   Hypotension, improved  -Mild and Asymptomatic. Patient states normally her BP's run on the softer sider but haven't been this low -Given a bolus of 500 mL on 07/02/21; mIVF now discontinued  -Last blood pressure reading was 110/61 this AM -Continue to Monitor BP per Protocol   Orthostatic Hypotension -Dropped today on VS and will bolus 1 Liter and will give another 500 mL this evening -Still requires Mod 2+ To Rise into standing  -Repeat Orthostatic VS in the morning. -Unlikely to have TED hose given her fractures -Could consider abdominal binder if she continues to be orthostatic in morning   Left Patella Fracture s/p ORIF 07/01/21 -DG Left Knee showed "There are changes consistent with a transverse fracture through the patella. There is distraction of the fracture fragments of at least 3 cm. Fat fluid effusion is noted between the fracture  fragments. Additionally on the frontal film there is fragmentation of the more superior fragment identified" -Left leg in knee immobilizer -Surgery today on Wednesday 07/01/21 and she underwent ORIF of the left patella -Orthopedic surgery recommending weightbearing as tolerated in the left lower extremity with AKI/hinged brace locked in full extension which has still yet to be delivered but has been ordered  -Pain control as above   Acute Urinary Retention -Bladder scan obtained the day before yesterday showed 800 cc urine -Likely induced by opioids -Continue in-and-out cath every 6 hours as needed -Improving and continue to Monitor -Up and Out of Bed    Leukocytosis -Likely Reactive in the setting of Pain and Surgery -WBC went from 7.8 -> 8.9 -> 8.9 -> 10.0 -> 12.4 -> 8.8 on last check  -Patient is afebrile -Continue to Monitor for S/Sx of Infection -Repeat CBC intermittently now that Leukocytosis has resolved   Hyperglycemia in setting of Prediabetes -Patient's  Blood Sugar on Admission was 151 on CMP and was 107 on last CMP check; ranging from 107-155 daily BMP/CMP's -Continue to Monitor Blood Sugars per Protocol and if Necessary will place on Sensitive Novolog SSI AC -Check HbA1c is 5.8   Constipation, improved  -Has not had a bowel movement in over a 1 week but finally had one the night before last as well as last night  -Will change Bowel Regimen from Docusate 100 mg po BID and Miralax 17 gram dailyprn to Scheduled Miralax 17 gram po BID and Senna-Docusate 1 tab po BID;  -Will add Bisacodyl 10 mg DailyPRN suppository  -If no bowel movement by tomorrow may need an Enema   Normocytic Anemia/IDA superimposed on Postoperative acute blood loss anemia -Patient's Hgb/Hct went from 11.5/34.7 -> 10.7/32.6 -> 11.3/34.3 -> 12.0/36.9 -> 9.6/29.0 -> 9.5/28.4 -> 9.5/29.6 and likely Post-Opertative Drop and Blood Loss along with some component of dilution  -Checked Anemia Panel and showed an iron level of 21, U IBC of 235, TIBC 256, saturation ratios of 8%, ferritin level 170, folate level 8.5, and vitamin B12 level of 133 -We will start supplementation Niferex 150 mg p.o. daily as well as given a 1000 mcg injection of cyanocobalamin yesterday and start p.o. cyanocobalamin 1000 mcg today  -Continue to Monitor for S/Sx of Bleeding; Currently no overt bleeding noted -Repeat CBC intermittently now that Hgb/Hct appears  stable    Hyponatremia -Mild at 133 and now improved to 140 on last check  -IVF with NS + 20 mEQ KCL at 75 mL/hr now discontinued  -Continue to Monitor and Trend  BMP intermittently    Obesity -Complicates overall prognosis and care -Estimated body mass index is 30.41 kg/m as calculated from the following:   Height as of this encounter: '5\' 8"'$  (1.727 m).   Weight as of this encounter: 90.7 kg. -Weight Loss and Dietary Counseling given   Discharge Instructions  Discharge Instructions     Call MD for:  difficulty breathing, headache or visual  disturbances   Complete by: As directed    Call MD for:  extreme fatigue   Complete by: As directed    Call MD for:  hives   Complete by: As directed    Call MD for:  persistant dizziness or light-headedness   Complete by: As directed    Call MD for:  persistant nausea and vomiting   Complete by: As directed    Call MD for:  redness, tenderness, or signs of infection (pain, swelling, redness, odor or green/yellow discharge around incision site)   Complete by: As directed    Call MD for:  severe uncontrolled pain   Complete by: As directed    Call MD for:  temperature >100.4   Complete by: As directed    Diet - low sodium heart healthy   Complete by: As directed    Discharge instructions   Complete by: As directed    You were cared for by a hospitalist during your hospital stay. If you have any questions about your discharge medications or the care you received while you were in the hospital after you are discharged, you can call the unit and ask to speak with the hospitalist on call if the hospitalist that took care of you is not available. Once you are discharged, your primary care physician will handle any further medical issues. Please note that NO REFILLS for any discharge medications will be authorized once you are discharged, as it is imperative that you return to your primary care physician (or establish a relationship with a primary care physician if you do not have one) for your aftercare needs so that they can reassess your need for medications and monitor your lab values.  Follow up with PCP and Orthopedic Surgery. Take all medications as prescribed. If symptoms change or worsen please return to the ED for evaluation   Discharge wound care:   Complete by: As directed    Reinforce Dressing   Increase activity slowly   Complete by: As directed       Allergies as of 07/07/2021   No Known Allergies      Medication List     STOP taking these medications    ibuprofen 200  MG tablet Commonly known as: ADVIL   Pfizer-BioNTech COVID-19 Vacc 30 MCG/0.3ML injection Generic drug: COVID-19 mRNA vaccine (Pfizer)       TAKE these medications    acetaminophen 325 MG tablet Commonly known as: TYLENOL Take 650 mg by mouth every 6 (six) hours as needed for moderate pain or headache.   cyanocobalamin 1000 MCG tablet Take 1 tablet (1,000 mcg total) by mouth daily.   HYDROcodone-acetaminophen 5-325 MG tablet Commonly known as: NORCO/VICODIN Take 1-2 tablets by mouth every 6 (six) hours as needed for moderate pain (pain score 4-6).   iron polysaccharides 150 MG capsule Commonly known as: NIFEREX Take 1 capsule (  150 mg total) by mouth daily.   methocarbamol 500 MG tablet Commonly known as: ROBAXIN Take 1 tablet (500 mg total) by mouth every 6 (six) hours as needed for muscle spasms.   ondansetron 4 MG tablet Commonly known as: ZOFRAN Take 1 tablet (4 mg total) by mouth every 6 (six) hours as needed for nausea.   polyethylene glycol 17 g packet Commonly known as: MIRALAX / GLYCOLAX Take 17 g by mouth 2 (two) times daily.   senna-docusate 8.6-50 MG tablet Commonly known as: Senokot-S Take 1 tablet by mouth 2 (two) times daily.   Vitamin D3 25 MCG tablet Commonly known as: Vitamin D Take 2 tablets (2,000 Units total) by mouth daily.               Discharge Care Instructions  (From admission, onward)           Start     Ordered   07/06/21 0000  Discharge wound care:       Comments: Reinforce Dressing   07/06/21 0916            No Known Allergies  Consultations: Orthopedic Surgery Physiatry   Procedures/Studies: DG Pelvis 1-2 Views  Result Date: 06/26/2021 CLINICAL DATA:  Fall EXAM: PELVIS - 1-2 VIEW COMPARISON:  None. FINDINGS: No fracture or malalignment. Pubic symphysis and rami appear intact. Mild degenerative changes of both hips. IMPRESSION: No acute osseous abnormality. Electronically Signed   By: Donavan Foil M.D.    On: 06/26/2021 20:44   DG Knee 1-2 Views Left  Result Date: 07/01/2021 CLINICAL DATA:  Two heart surgery. ORIF right ankle and ORIF left patella. EXAM: DG C-ARM 1-60 MIN; RIGHT ANKLE - COMPLETE 3+ VIEW; LEFT KNEE - 1-2 VIEW FLUOROSCOPY TIME:  Fluoroscopy Time:  1 minute, 4 seconds Radiation Exposure Index (if provided by the fluoroscopic device): 1.84 mGy. Number of Acquired Spot Images: 21 COMPARISON:  06/26/2021. FINDINGS: Twenty-one total C-arm fluoroscopic images were obtained intraoperatively and submitted for post operative interpretation. The ankle fluoroscopy demonstrates plate screw fixation distal fibula, single screw fixation through the distal tibia, and tight rope syndesmotic repair. Improved alignment, near anatomic. The patellar fluoroscopy demonstrates plate and screw fixation of the patellar fracture with improved, near anatomic alignment. No unexpected findings. Expected soft tissue swelling and gas. Please see the performing provider's procedural report for further detail. IMPRESSION: Intraoperative fluoroscopy during ankle and patellar surgery. Electronically Signed   By: Margaretha Sheffield MD   On: 07/01/2021 14:33   DG Tibia/Fibula Right  Result Date: 06/26/2021 CLINICAL DATA:  Fall EXAM: RIGHT TIBIA AND FIBULA - 2 VIEW COMPARISON:  None. FINDINGS: Proximal tibia and fibula appear intact. Acute comminuted and displaced distal fibular fracture. Acute displaced posterior malleolar fracture. Posterior dislocation of the talus IMPRESSION: Acute fracture dislocation at the ankle. Proximal tibia and fibula appear intact. Electronically Signed   By: Donavan Foil M.D.   On: 06/26/2021 20:49   DG Ankle 2 Views Right  Result Date: 06/26/2021 CLINICAL DATA:  Status post reduction and casting EXAM: RIGHT ANKLE - 2 VIEW COMPARISON:  Films from earlier in the same day. FINDINGS: The fracture fragments have been reduced and the posterior talar dislocation has also been reduced. Splinting material is  noted. IMPRESSION: Interval reduction of fractures and talar dislocation. Electronically Signed   By: Inez Catalina M.D.   On: 06/26/2021 22:35   DG Ankle 2 Views Right  Result Date: 06/26/2021 CLINICAL DATA:  Fall EXAM: RIGHT ANKLE - 2 VIEW COMPARISON:  None. FINDINGS: Acute comminuted fracture involving distal shaft of the fibula with slightly greater than 1 shaft diameter posterior displacement of distal fracture fragment. There is overriding. Acute displaced posterior malleolar fracture. Posterior dislocation of the talus with respect to the distal tibia. IMPRESSION: Acute comminuted distal tibial and fibular fractures with posterior dislocation of the talus with respect to the distal tibia Electronically Signed   By: Donavan Foil M.D.   On: 06/26/2021 20:46   DG Ankle Complete Right  Result Date: 07/01/2021 CLINICAL DATA:  Two heart surgery. ORIF right ankle and ORIF left patella. EXAM: DG C-ARM 1-60 MIN; RIGHT ANKLE - COMPLETE 3+ VIEW; LEFT KNEE - 1-2 VIEW FLUOROSCOPY TIME:  Fluoroscopy Time:  1 minute, 4 seconds Radiation Exposure Index (if provided by the fluoroscopic device): 1.84 mGy. Number of Acquired Spot Images: 21 COMPARISON:  06/26/2021. FINDINGS: Twenty-one total C-arm fluoroscopic images were obtained intraoperatively and submitted for post operative interpretation. The ankle fluoroscopy demonstrates plate screw fixation distal fibula, single screw fixation through the distal tibia, and tight rope syndesmotic repair. Improved alignment, near anatomic. The patellar fluoroscopy demonstrates plate and screw fixation of the patellar fracture with improved, near anatomic alignment. No unexpected findings. Expected soft tissue swelling and gas. Please see the performing provider's procedural report for further detail. IMPRESSION: Intraoperative fluoroscopy during ankle and patellar surgery. Electronically Signed   By: Margaretha Sheffield MD   On: 07/01/2021 14:33   CT ANKLE RIGHT WO  CONTRAST  Result Date: 06/27/2021 CLINICAL DATA:  Complex ankle fractures. EXAM: CT OF THE RIGHT ANKLE WITHOUT CONTRAST TECHNIQUE: Multidetector CT imaging of the right ankle was performed according to the standard protocol. Multiplanar CT image reconstructions were also generated. COMPARISON:  Radiographs 06/26/2021 FINDINGS: Complex comminuted spiral type fracture involving the distal fibular shaft at and above the level of the ankle mortise. Maximum posterior displacement of 11 mm. Oblique coursing comminuted longitudinal intra-articular fracture involving the posterior malleolus of the tibia. Maximum posterior displacement is 7 mm. Maximum gap at the articular surface is 5 mm. The medial malleolus is intact. The medial mortise is slightly widened and a few tiny bony densities are noted near the talus. Possible deltoid ligament injury. There is a small impaction type fracture involving the medial and anterior aspect of the talar dome. The subtalar joints are maintained. The sinus tarsi is unremarkable. No fracture of the calcaneus or midfoot bony structures. Mild midfoot degenerative changes are noted. Grossly by CT the medial and lateral and anterior ankle tendons are intact. The Achilles tendon is intact. There is a moderate-sized calcaneal heel spur noted. Reading remarkable running IMPRESSION: 1. Complex comminuted spiral type fracture involving the distal fibular shaft at and above the level of the ankle mortise. 2. Oblique coursing comminuted longitudinal intra-articular fracture involving the posterior malleolus of the tibia. Maximum posterior displacement is 7 mm. Maximum gap at the articular surface is 5 mm. 3. Small impaction type fracture involving the medial and anterior aspect of the talar dome. 4. Slight widening of the medial mortise and a few tiny bony densities near the talus. Possible deltoid ligament injury. Electronically Signed   By: Marijo Sanes M.D.   On: 06/27/2021 09:57   DG Chest  Port 1 View  Result Date: 06/27/2021 CLINICAL DATA:  Respiratory failure, preoperative EXAM: PORTABLE CHEST 1 VIEW COMPARISON:  CT 01/24/2018 FINDINGS: Streaky opacities towards the lung bases favoring atelectatic change. No consolidative process or convincing features of edema. The cardiomediastinal contours are unremarkable. No acute osseous or  soft tissue abnormality. Degenerative changes are present in the imaged spine and shoulders. Telemetry leads overlie the chest. IMPRESSION: Streaky opacities towards the lung bases, favoring atelectasis. No other acute cardiopulmonary abnormality. Electronically Signed   By: Lovena Le M.D.   On: 06/27/2021 01:34   DG Knee Left Port  Result Date: 07/01/2021 CLINICAL DATA:  Status post ORIF left patella EXAM: PORTABLE LEFT KNEE - 1-2 VIEW COMPARISON:  June 26, 2021 FINDINGS: Interval plate and screw fixation of the comminuted patellar fracture which appears in anatomic alignment. Postsurgical changes evident with a joint effusion and subcutaneous edema/gas. IMPRESSION: Postsurgical change of ORIF of comminuted patellar fracture. Electronically Signed   By: Dahlia Bailiff MD   On: 07/01/2021 16:36   DG Knee Left Port  Result Date: 06/26/2021 CLINICAL DATA:  Recent fall with knee pain, initial encounter EXAM: PORTABLE LEFT KNEE - 1-2 VIEW COMPARISON:  None. FINDINGS: There are changes consistent with a transverse fracture through the patella. There is distraction of the fracture fragments of at least 3 cm. Fat fluid effusion is noted between the fracture fragments. Additionally on the frontal film there is fragmentation of the more superior fragment identified. IMPRESSION: Comminuted patellar fracture with associated joint effusion. Electronically Signed   By: Inez Catalina M.D.   On: 06/26/2021 22:01   DG Ankle Right Port  Result Date: 07/01/2021 CLINICAL DATA:  Status post ORIF EXAM: PORTABLE RIGHT ANKLE - 2 VIEW COMPARISON:  06/26/2021 FINDINGS: Interval  surgical plate and multiple screw fixation of the distal fibula across comminuted distal fibular fracture. Fixating screw at the distal tibia with cortical anchor medially at the distal tibia. Near anatomic alignment. Ankle mortise symmetric. IMPRESSION: Interval surgical fixation of distal tibial and fibular fractures Electronically Signed   By: Donavan Foil M.D.   On: 07/01/2021 16:36   DG Foot 2 Views Right  Result Date: 06/26/2021 CLINICAL DATA:  Tripping going downstairs EXAM: RIGHT FOOT - 2 VIEW COMPARISON:  Contemporary tibia/fibular radiographs FINDINGS: Supra syndesmotic distal fibular fracture with posterior displacement and angulation and mild comminution. Posterior superiorly displaced fracture of the posterior malleolus. No clear medial malleolar fracture. Lateral talar shift and talar tilting is noted compatible with ankle instability. Large ankle joint effusion. Extensive circumferential soft tissue swelling. Background of mild degenerative changes as well as chronic features suggesting some degree of synostosis of the distal tibiofibular articulation. Bidirectional calcaneal spurring is noted. 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 Associated effusion circumferential ankle swelling. Electronically Signed   By: Lovena Le M.D.   On: 06/26/2021 21:29   DG C-Arm 1-60 Min  Result Date: 07/01/2021 CLINICAL DATA:  Two heart surgery. ORIF right ankle and ORIF left patella. EXAM: DG C-ARM 1-60 MIN; RIGHT ANKLE - COMPLETE 3+ VIEW; LEFT KNEE - 1-2 VIEW FLUOROSCOPY TIME:  Fluoroscopy Time:  1 minute, 4 seconds Radiation Exposure Index (if provided by the fluoroscopic device): 1.84 mGy. Number of Acquired Spot Images: 21 COMPARISON:  06/26/2021. FINDINGS: Twenty-one total C-arm fluoroscopic images were obtained intraoperatively and submitted for post operative  interpretation. The ankle fluoroscopy demonstrates plate screw fixation distal fibula, single screw fixation through the distal tibia, and tight rope syndesmotic repair. Improved alignment, near anatomic. The patellar fluoroscopy demonstrates plate and screw fixation of the patellar fracture with improved, near anatomic alignment. No unexpected findings. Expected soft tissue swelling and gas. Please see the performing provider's  procedural report for further detail. IMPRESSION: Intraoperative fluoroscopy during ankle and patellar surgery. Electronically Signed   By: Margaretha Sheffield MD   On: 07/01/2021 14:33   DG Femur Min 2 Views Right  Result Date: 06/26/2021 CLINICAL DATA:  Fall EXAM: RIGHT FEMUR 2 VIEWS COMPARISON:  None. FINDINGS: There is no evidence of fracture or other focal bone lesions. Soft tissues are unremarkable. IMPRESSION: Negative. Electronically Signed   By: Donavan Foil M.D.   On: 06/26/2021 20:46     Subjective: Seen and examined at bedside and finally had a bowel movement last night and stated it hurt so she is resting now. States her ankle was throbbing about a 2 or 3 in severity. No CP or SOB. No other concerns or complaints and is medically stable to got to CIR however unfortunately she got denied.  Prior to this she ended up also become orthostatic so we will give her 1 L bolus.   Discharge Exam: Vitals:   07/07/21 0700 07/07/21 1138  BP: 110/61 136/86  Pulse: 79   Resp: 18   Temp: 98.6 F (37 C)   SpO2: 97%    Vitals:   07/06/21 1714 07/06/21 2259 07/07/21 0700 07/07/21 1138  BP: (!) 136/47 111/65 110/61 136/86  Pulse: 60 82 79   Resp: '18 16 18   '$ Temp: 97.8 F (36.6 C) 98.6 F (37 C) 98.6 F (37 C)   TempSrc: Oral  Oral   SpO2: 96% 100% 97%   Weight:      Height:       General: Pt is alert, awake, not in acute distress Cardiovascular: RRR, S1/S2 +, no rubs, no gallops Respiratory: Slightly diminished bilaterally, no wheezing, no rhonchi; Unlabored  breathing  Abdominal: Soft, NT, Distended 2/2 body habitus, bowel sounds + Extremities: no edema, no cyanosis; Right Ankle ina boot and and Left Knee in Knee Imobilizer locked in full extension  The results of significant diagnostics from this hospitalization (including imaging, microbiology, ancillary and laboratory) are listed below for reference.    Microbiology: Recent Results (from the past 240 hour(s))  Surgical pcr screen     Status: None   Collection Time: 06/27/21  8:20 PM   Specimen: Nasal Mucosa; Nasal Swab  Result Value Ref Range Status   MRSA, PCR NEGATIVE NEGATIVE Final   Staphylococcus aureus NEGATIVE NEGATIVE Final    Comment: (NOTE) The Xpert SA Assay (FDA approved for NASAL specimens in patients 77 years of age and older), is one component of a comprehensive surveillance program. It is not intended to diagnose infection nor to guide or monitor treatment. Performed at [facility], [place] [address] [place], [place] [address]     Labs: BNP (last 3 results) No results for input(s): BNP in the last 8760 hours. Basic Metabolic Panel: Recent Labs  Lab 07/01/21 0818 07/02/21 0241 07/03/21 0335 07/04/21 0250  NA 138 137 140  --   K 4.3 4.4 3.8  --   CL 102 104 107  --   CO2 '28 27 26  '$ --   GLUCOSE 113* 155* 107*  --   BUN '15 14 14  '$ --   CREATININE 0.72 0.67 0.79  --   CALCIUM 9.1 8.9 8.4*  --   MG 1.9 2.0 1.8 1.8  PHOS 3.9 2.5 3.0  --     Liver Function Tests: Recent Labs  Lab 07/01/21 0818 07/02/21 0241 07/03/21 0335  AST '22 20 18  '$ ALT '20 16 14  '$ ALKPHOS  40 35* 40  BILITOT 0.5 0.4 0.3  PROT 6.7 6.0* 5.6*  ALBUMIN 3.5 3.0* 2.8*    No results for input(s): LIPASE, AMYLASE in the last 168 hours. No results for input(s): AMMONIA in the last 168 hours. CBC: Recent Labs  Lab 07/01/21 0818 07/02/21 0241 07/03/21 0335 07/04/21 0250  WBC 8.9 10.0 12.4* 8.8  NEUTROABS 5.8 8.8* 9.5* 5.2  HGB 12.0 9.6* 9.5* 9.5*  HCT 36.9 29.0* 28.4* 29.6*   MCV 93.9 94.2 95.0 95.5  PLT 337 291 319 335    Cardiac Enzymes: No results for input(s): CKTOTAL, CKMB, CKMBINDEX, TROPONINI in the last 168 hours. BNP: Invalid input(s): POCBNP CBG: No results for input(s): GLUCAP in the last 168 hours. D-Dimer No results for input(s): DDIMER in the last 72 hours. Hgb A1c No results for input(s): HGBA1C in the last 72 hours. Lipid Profile No results for input(s): CHOL, HDL, LDLCALC, TRIG, CHOLHDL, LDLDIRECT in the last 72 hours. Thyroid function studies No results for input(s): TSH, T4TOTAL, T3FREE, THYROIDAB in the last 72 hours.  Invalid input(s): FREET3 Anemia work up No results for input(s): VITAMINB12, FOLATE, FERRITIN, TIBC, IRON, RETICCTPCT in the last 72 hours. Urinalysis No results found for: COLORURINE, APPEARANCEUR, [place], [place], GLUCOSEU, [place], [place], [place], PROTEINUR, UROBILINOGEN, NITRITE, LEUKOCYTESUR Sepsis Labs Invalid input(s): PROCALCITONIN,  WBC,  LACTICIDVEN Microbiology Recent Results (from the past 240 hour(s))  Surgical pcr screen     Status: None   Collection Time: 06/27/21  8:20 PM   Specimen: Nasal Mucosa; Nasal Swab  Result Value Ref Range Status   MRSA, PCR NEGATIVE NEGATIVE Final   Staphylococcus aureus NEGATIVE NEGATIVE Final    Comment: (NOTE) The Xpert SA Assay (FDA approved for NASAL specimens in patients 22 years of age and older), is one component of a comprehensive surveillance program. It is not intended to diagnose infection nor to guide or monitor treatment. Performed at [facility], [place] [address] [place], [place] [address]    Time coordinating discharge: 35 minutes  SIGNED:  Kerney Elbe, DO [facility] 07/07/2021, 1:41 PM Pager is on [place]  If 7PM-7AM, please contact night-coverage [email or web address]

## 2021-07-07 NOTE — Progress Notes (Signed)
Inpatient Rehab Admissions Coordinator:   Initial request for CIR denied.  Dr. Naaman Plummer completed peer to peer and ins MD continues to deny request.  I filed an expedited appeal as of 5:09PM today.  Will await results, which we should have within 72 hours.    Shann Medal, PT, DPT Admissions Coordinator [phone] 07/07/21  5:09 PM

## 2021-07-08 ENCOUNTER — Encounter (HOSPITAL_COMMUNITY): Payer: Self-pay | Admitting: Internal Medicine

## 2021-07-08 DIAGNOSIS — I951 Orthostatic hypotension: Secondary | ICD-10-CM | POA: Diagnosis not present

## 2021-07-08 DIAGNOSIS — S82401A Unspecified fracture of shaft of right fibula, initial encounter for closed fracture: Secondary | ICD-10-CM | POA: Diagnosis not present

## 2021-07-08 DIAGNOSIS — S82201A Unspecified fracture of shaft of right tibia, initial encounter for closed fracture: Secondary | ICD-10-CM | POA: Diagnosis not present

## 2021-07-08 MED ORDER — CEFTRIAXONE SODIUM 2 G IJ SOLR
2.0000 g | INTRAMUSCULAR | Status: AC
  Administered 2021-07-08: 2 g via INTRAVENOUS
  Filled 2021-07-08: qty 20

## 2021-07-08 MED ORDER — SODIUM CHLORIDE 0.9 % IV SOLN: INTRAVENOUS | Status: DC

## 2021-07-08 MED ORDER — MIDODRINE HCL 5 MG PO TABS
5.0000 mg | ORAL_TABLET | Freq: Three times a day (TID) | ORAL | Status: DC
  Administered 2021-07-08 – 2021-07-10 (×6): 5 mg via ORAL
  Filled 2021-07-08 (×6): qty 1

## 2021-07-08 MED ORDER — SULFAMETHOXAZOLE-TRIMETHOPRIM 800-160 MG PO TABS
1.0000 | ORAL_TABLET | Freq: Two times a day (BID) | ORAL | Status: DC
  Administered 2021-07-09 – 2021-07-13 (×8): 1 via ORAL
  Filled 2021-07-08 (×8): qty 1

## 2021-07-08 NOTE — Progress Notes (Addendum)
NCM spoke with pt @ bedside along with pt's sister Lelon Frohlich this am regarding TOC needs. Verbal consent to discuss needs with sister @ bedside given prior. Pt hoping to transition to CIR. NCM reinforced noted appeal in process for IR after receiving denial from MD's peer to peer. Sister states shouldn't it make a difference being it's a workman's compensation case. Pt provided NCM claim # [id number]. NCM shared information with CIR admission liaison.   Clinicals faxed to [phone], Micheal Likens [phone]), [email or web address] .   Determination pending for IR approval .... Whitman Hero RN,BSN,CM [phone]

## 2021-07-08 NOTE — Progress Notes (Addendum)
Received a call to assess patient's left knee.  Came to bedside, patient is alert and not in acute distress.  Per bedside RN, left knee appears more edematous, erythematous and warmer to the touch than previously.  Patient reports her pain at the time of this visit is 3 out of 10 when at rest.  Due to concern for early cellulitis with elevated temperature 99.9, blood cultures x2 were obtained peripherally.  Started on Rocephin 2 g daily empirically.  Per patient's request the writer called and updated her sister Lelon Frohlich via phone.    Her sister requested that orthopedic surgery team contact her and make her aware of when they are rounding.  She would like to be present when they round on the patient.   Deferring to dayshift team to reconsult orthopedic surgery.  The writer sent a secure chat message to Dr. Lyla Glassing.    Left knee 07/08/2021 2 AM  No charge note.

## 2021-07-08 NOTE — Progress Notes (Signed)
OT Cancellation Note  Patient Details Name: Courtney Berg MRN: [id number] DOB: [birth year]   Cancelled Treatment:    Reason Eval/Treat Not Completed: Medical issues which prohibited therapy (Pt fainted during PT session with low BP. Pt in trendelenburg with attention of medical team upon my arrival. OT treatment to follow up today as vitals improve.)  Allie Ousley A Josefita Weissmann 07/08/2021, 11:13 AM

## 2021-07-08 NOTE — Progress Notes (Signed)
Occupational Therapy Treatment Patient Details Name: Courtney Berg MRN: [id number] DOB: 06-07-1956 Today's Date: 07/08/2021    History of present illness The pt is a 65 year old female who presented to the ED 8/5 after fall downstairs at work, left knee x-ray showed patella fracture, right ankle and foot x-ray showed distal tibia and fibula fracture and dislocated ankle. Pt now s/p ORIF of R ankle and L patella 8/10. PMH includes diabetes, hypertension, cancer.   OT comments  Ciarah's progression is limited to do recent orthostatic drops in BP. She is motivated to participate in therapy and eager to get stronger and go home. Pt had an asymptomatic drop in BP after sitting EOB for ~3 minutes. BP as follows: supine (121/64), sitting (135/65), sitting for 3 minutes (113/65). Pt completed all bed mobility with great adherence to WB, given a gait belt on her brace to manage LLE and min guard for safety. Pt continues to benefit from OT acutely.    Follow Up Recommendations  CIR    Equipment Recommendations  Other (comment)    Recommendations for Other Services Rehab consult    Precautions / Restrictions Precautions Precautions: Fall Precaution Comments: orthostatic hypotension Required Braces or Orthoses: Other Brace Knee Immobilizer - Left: On at all times Other Brace: CAM walker boot RLE-when OOB- can be off in bed. Restrictions Weight Bearing Restrictions: Yes RLE Weight Bearing: Non weight bearing LLE Weight Bearing:  (can weightbear on R knee wtih knee scooter) Other Position/Activity Restrictions: pt is allowed gentle ROM to R ankle out of CAM boot when at rest with leg supported       Mobility Bed Mobility Overal bed mobility: Needs Assistance Bed Mobility: Supine to Sit     Supine to sit: HOB elevated;Min guard     General bed mobility comments: Used gt belt attached to bledsoe brace to move LLE to edge of bed. Pt uses bed rails and controls to transition supine > sit  EOB.    Transfers Overall transfer level: Needs assistance Equipment used: 2 person hand held assist Transfers: Sit to/from [facility] Sit to Stand: Mod assist;+2 physical assistance;From elevated surface Stand pivot transfers: Max assist;+2 physical assistance;+2 safety/equipment       General transfer comment: defered this session due to orthostatic hypotension    Balance Overall balance assessment: Needs assistance;History of Falls Sitting-balance support: Feet supported;Bilateral upper extremity supported Sitting balance-Leahy Scale: Fair Sitting balance - Comments: pt able to sustain sitting balance with support of one UE for ~5 minutes   Standing balance support: Bilateral upper extremity supported;During functional activity Standing balance-Leahy Scale: Poor Standing balance comment: Reliant on bil UE support and mod-maxAx2 for standing.           ADL either performed or assessed with clinical judgement   ADL Overall ADL's : Needs assistance/impaired           Functional mobility during ADLs:  (session focused on changing position and management of orthostatic hypotension) General ADL Comments: Pt declined all grooming and ADL needs while sitting EOB      Cognition Arousal/Alertness: Awake/alert Behavior During Therapy: WFL for tasks assessed/performed Overall Cognitive Status: Within Functional Limits for tasks assessed               General Comments: pt very motivated to participate              General Comments SpO2 and HR stable throughout session. Pt had an orthostatic drop in BP after sitting for ~3 minutes  Pertinent Vitals/ Pain       Pain Assessment: Faces Faces Pain Scale: Hurts a little bit Pain Location: R ankle Pain Descriptors / Indicators: Tingling Pain Intervention(s): Limited activity within patient's tolerance;Monitored during session         Frequency  Min 2X/week        Progress Toward  Goals  OT Goals(current goals can now be found in the care plan section)  Progress towards OT goals: Progressing toward goals  Acute Rehab OT Goals Patient Stated Goal: return to independence OT Goal Formulation: With patient Time For Goal Achievement: 07/16/21 Potential to Achieve Goals: Good ADL Goals Pt Will Perform Lower Body Bathing: with min assist;sitting/lateral leans;sit to/from stand Pt Will Perform Lower Body Dressing: with min assist;sitting/lateral leans;sit to/from stand Pt Will Transfer to Toilet: with min assist;stand pivot transfer Pt Will Perform Toileting - Clothing Manipulation and hygiene: with min assist;sitting/lateral leans;sit to/from stand  Plan Discharge plan remains appropriate;Frequency remains appropriate       AM-PAC OT "6 Clicks" Daily Activity     Outcome Measure   Help from another person eating meals?: None Help from another person taking care of personal grooming?: A Little Help from another person toileting, which includes using toliet, bedpan, or urinal?: A Lot Help from another person bathing (including washing, rinsing, drying)?: A Lot Help from another person to put on and taking off regular upper body clothing?: A Little Help from another person to put on and taking off regular lower body clothing?: A Lot 6 Click Score: 16    End of Session Equipment Utilized During Treatment: Left knee immobilizer  OT Visit Diagnosis: Unsteadiness on feet (R26.81);Other abnormalities of gait and mobility (R26.89);Muscle weakness (generalized) (M62.81);Pain Pain - Right/Left: Left Pain - part of body: Knee   Activity Tolerance Patient tolerated treatment well;Treatment limited secondary to medical complications (Comment)   Patient Left in bed;with call bell/phone within reach;with chair alarm set   Nurse Communication Mobility status        Time: 0310-0330 OT Time Calculation (min): 20 min  Charges: OT General Charges $OT Visit: 1 Visit OT  Treatments $Self Care/Home Management : 8-22 mins    Corion Sherrod A Jarret Torre 07/08/2021, 4:00 PM

## 2021-07-08 NOTE — Progress Notes (Signed)
PT notified this RN that when attempting to stand with patient to transfer to chair, patient passed out after 1 minute standing and BP dropped to 89/71. Paged MD Ayiku to update. Patient placed on continuous pulse ox and trendelenburg at this time. Sister at bedside.   1105:  MD returned page and discussed plan of care and medication; see MAR.

## 2021-07-08 NOTE — Progress Notes (Addendum)
Orthopaedic Trauma Progress Note  SUBJECTIVE: Per notes, patient appeared to be doing well over the weekend and was progressing with therapies but due to pain and limited mobility she decided a short term stay in CIR would be a safer discharge plan at this time. Starting on Monday, 07/06/21, patient noted to be dizzy with therapies. Orthostatic vitals taken both yesterday and today which showed significant drop in blood pressure after standing.  Patient evaluated this morning, currently working to get up to bedside chair with therapies. Sister Lelon Frohlich) is at bedside. Upon pivoting to sit in bedside chair, patient noted to be extremely dizzy with drop in BP to 89/71. She became unresponsive for roughly 20 seconds before being able to answer questions again. Patient placed on continuous pulse ox and in Trendelenburg position in bedside chair until vitals returned to normal. Patient transferred back to bed by therapists, nursing, myself. Primary team made aware of incident, patient started on Midodrine.  Starting last night patient's RN noticed increased redness and warmth around left knee incision. Hospitalist evaluated patient and started her on Ceftriaxone empirically for early cellulitis.  Blood cultures drawn at the time, results still pending but no growth currently. WBC elevated from 8.8 to 10.6 over the last few days. This morning, redness and warmth noted to the left knee. No drainage from incision. No issues with right ankle incision. Patient notes subjective fevers over the last few days. Tmax 100 on 07/05/21. No tachycardia or tachypnea noted.   OBJECTIVE:  Vitals:   07/07/21 2204 07/08/21 0700  BP: (!) 132/55 (!) 119/56  Pulse: 89 79  Resp: 18 18  Temp: 99.9 F (37.7 C) 99.3 F (37.4 C)  SpO2: 98% 100%    General: laying in reclined position in bed upon leaving the room, NAD Respiratory: No increased work of breathing.  LLE: Hinge brace in place. Incision as pictured below. Increased  redness and warmth to the area. No drainage. Notable bruising and swelling posterior knee and lower leg. Able to wiggle toes. Tolerates ankle DF/PF but does note tightness through the calf with DF as expected. Endorses sensation to light touch over the foot.  Compartments soft and compressible. + DP pulse   RLE: Cam boot currently in place, removed for dressing change.  Dressing removed, incisions clean, dry, intact.  No drainage. Bruising about the ankle as expected. Ankle DF/PF intact. Able to wiggle toes. Endorses sensation to light touch over all aspects of foot. Foot is warm and well-perfused. +DP pulse  IMAGING: Stable post op imaging.   LABS:  Results for orders placed or performed during the hospital encounter of 06/26/21 (from the past 24 hour(s))  CBC with Differential/Platelet     Status: Abnormal   Collection Time: 07/07/21 10:51 PM  Result Value Ref Range   WBC 10.6 (H) 4.0 - 10.5 K/uL   RBC 3.43 (L) 3.87 - 5.11 MIL/uL   Hemoglobin 10.7 (L) 12.0 - 15.0 g/dL   HCT 32.0 (L) 36.0 - 46.0 %   MCV 93.3 80.0 - 100.0 fL   MCH 31.2 26.0 - 34.0 pg   MCHC 33.4 30.0 - 36.0 g/dL   RDW 13.5 11.5 - 15.5 %   Platelets 384 150 - 400 K/uL   nRBC 0.0 0.0 - 0.2 %   Neutrophils Relative % 67 %   Neutro Abs 7.1 1.7 - 7.7 K/uL   Lymphocytes Relative 18 %   Lymphs Abs 1.9 0.7 - 4.0 K/uL   Monocytes Relative 11 %  Monocytes Absolute 1.2 (H) 0.1 - 1.0 K/uL   Eosinophils Relative 2 %   Eosinophils Absolute 0.2 0.0 - 0.5 K/uL   Basophils Relative 1 %   Basophils Absolute 0.1 0.0 - 0.1 K/uL   Immature Granulocytes 1 %   Abs Immature Granulocytes 0.07 0.00 - 0.07 K/uL  Culture, blood (routine x 2)     Status: None (Preliminary result)   Collection Time: 07/07/21 10:51 PM   Specimen: BLOOD  Result Value Ref Range   Specimen Description BLOOD RIGHT ANTECUBITAL    Special Requests      BOTTLES DRAWN AEROBIC AND ANAEROBIC Blood Culture adequate volume   Culture      NO GROWTH < 12  HOURS Performed at [facility], [address] [place], [place] [address]    Report Status PENDING   Culture, blood (routine x 2)     Status: None (Preliminary result)   Collection Time: 07/07/21 10:51 PM   Specimen: BLOOD RIGHT HAND  Result Value Ref Range   Specimen Description BLOOD RIGHT HAND    Special Requests      BOTTLES DRAWN AEROBIC AND ANAEROBIC Blood Culture adequate volume   Culture      NO GROWTH < 12 HOURS Performed at [facility], [place] [address], [place], [place] [address]    Report Status PENDING     ASSESSMENT: REMMY JENNESS is a 65 y.o. female, 7 Days Post-Op s/p Procedure(s): ORIF RIGHT ANKLE FRACTURE ORIF LEFT PATELLA  CV/Blood loss: Hgb steadily improving, 10.7 yesterday morning.  PLAN: Weightbearing: NWB RLE, WBAT LLE with hinge brace locked in full extension ROM: Ok for R ankle ROM as tolerated at rest Incisional and dressing care:  Ok to leave open to air Showering: Ok to shower with assistance. Incisions may get wet Orthopedic device(s): CAM boot RLE when OOB, Bledsoe brace LLE Pain management:  1. Tylenol 325-650 mg q 6 hours PRN 2. Robaxin 500 mg q 6 hours PRN 3. Norco 5-325 mg q 4 hours PRN moderate pain OR Norco 7.5-325 mg q 4 hours PRN severe pain  4. Dilaudid 0.5-1 mg q 4 hours PRN VTE prophylaxis: Lovenox, SCDs ID:  Ancef 2gm post op completed. Started on Ceftriaxone 2 g q 24hrs for cellulitis starting 07/07/21 Foley/Lines:  No foley, KVO IVFs Impediments to Fracture Healing: Vit D level 19, continue on D3 supplementation. Recommend continuing this at discharge  Dispo: Therapies as tolerated, PT/OT recommending CIR. Patient denied from CIR follow MD peer to peer. TOC involved and working on this - Continue to monitor orthostatic vitals. - Continue with dose of Ceftriaxone 2 gm IV scheduled for today at 2300. After discussion with Dr. Doreatha Martin, we will plan to switch patient from Ceftriaxone 2 gm q 24 hours to Bactrim DS BID x 5  days starting tomorrow morning.  - Ortho team will continue to evaluate patient daily for improvements in L knee cellulitis.   Family Communication: Discussed plan today with patient's sister Lelon Frohlich at bedside. She plans to be present tomorrow during exam as well.  Follow - up plan:  2 weeks after d/c for repeat x-rays and wound check  Contact information:  Katha Hamming MD, Patrecia Pace PA-C. After hours and holidays please check [email or web address] for group call information for [facility]   Aspen Deterding A. Ricci Barker, PA-C [phone] (office) [email or web address]

## 2021-07-08 NOTE — Progress Notes (Addendum)
Progress Note    Courtney Berg  [id number] DOB: 08/23/1956  DOA: 06/26/2021 PCP: Susy Frizzle, MD      Brief Narrative:    Medical records reviewed and are as summarized below:  Courtney Berg is a 65 y.o. female with medical history significant for environmental allergies, who presented to the hospital after a fall on the stairs at work.  Work-up revealed right bimalleolar ankle fracture dislocation and left patella fracture.  She underwent ORIF of right bimalleolar ankle fracture and ORIF of left patella on 07/01/2021.  She developed postoperative left knee cellulitis that was treated with antibiotics.  Hospital course was complicated by syncope and orthostatic hypotension.    Assessment/Plan:   Principal Problem:   Tibia/fibula fracture, right, closed, initial encounter Active Problems:   Allergies   Left patella fracture   Ankle dislocation, right, initial encounter   Closed fracture of right fibula and tibia   Elective surgery   Fall   Orthostatic hypotension    Body mass index is 30.41 kg/m.  (Obesity)    Right bimalleolar ankle fracture dislocation: S/p ORIF of right bimalleolar ankle fracture on 07/01/2021.  Analgesics as needed for pain.  Left patella fracture, left knee cellulitis: S/p ORIF of left patella fracture on 07/01/2021.  Continue IV Rocephin.  Analgesics as needed for pain.  S/p syncope from orthostatic hypotension: Started midodrine for orthostatic hypotension.  Check cortisol level tomorrow morning.  Monitor orthostatic vitals closely.  Acute urinary retention: In and out urethral catheterization as needed.  Prediabetes, hyperglycemia: Hemoglobin A1c 5.8.  Close outpatient follow-up with PCP recommended.  Vitamin B12 deficiency and vitamin D deficiency: B12 level was 133 vitamin D level was 19.2111.  Continue vitamin B12 and vitamin D supplement.  Vitamin B12 injection was recommended but patient declined and said she prefers oral  supplements.  Iron deficiency anemia: Iron level is 21, ferritin is 170 and percentage iron saturation is 8.  Constipation: Improved.  Continue laxatives as needed.  Hyponatremia and constipation: resolved   Diet Order             Diet - low sodium heart healthy           Diet regular Room service appropriate? Yes; Fluid consistency: Thin  Diet effective now                      Consultants: Orthopedic surgeon  Procedures: S/p ORIF right bimalleolar ankle fracture dislocation and s/p ORIF left patella knee fracture on 07/01/2021    Medications:    cholecalciferol  2,000 Units Oral Daily   iron polysaccharides  150 mg Oral Daily   midodrine  5 mg Oral TID WC   polyethylene glycol  17 g Oral BID   senna-docusate  1 tablet Oral BID   [START ON 07/09/2021] sulfamethoxazole-trimethoprim  1 tablet Oral Q12H   vitamin B-12  1,000 mcg Oral Daily   Continuous Infusions:  sodium chloride 75 mL/hr at 07/08/21 1138   cefTRIAXone (ROCEPHIN)  IV     methocarbamol (ROBAXIN) IV       Anti-infectives (From admission, onward)    Start     Dose/Rate Route Frequency Ordered Stop   07/09/21 2200  sulfamethoxazole-trimethoprim (BACTRIM DS) 800-160 MG per tablet 1 tablet        1 tablet Oral Every 12 hours 07/08/21 1309 07/14/21 2159   07/08/21 2300  cefTRIAXone (ROCEPHIN) 2 g in sodium chloride 0.9 % 100 mL IVPB  2 g 200 mL/hr over 30 Minutes Intravenous Every 24 hours 07/08/21 1309 07/09/21 2259   07/07/21 2300  cefTRIAXone (ROCEPHIN) 2 g in sodium chloride 0.9 % 100 mL IVPB  Status:  Discontinued        2 g 200 mL/hr over 30 Minutes Intravenous Every 24 hours 07/07/21 2207 07/08/21 1309   07/01/21 1615  ceFAZolin (ANCEF) IVPB 2g/100 mL premix        2 g 200 mL/hr over 30 Minutes Intravenous Every 8 hours 07/01/21 1518 07/02/21 0637   07/01/21 1305  vancomycin (VANCOCIN) powder  Status:  Discontinued          As needed 07/01/21 1317 07/01/21 1346   07/01/21 0800   ceFAZolin (ANCEF) IVPB 2g/100 mL premix  Status:  Discontinued        2 g 200 mL/hr over 30 Minutes Intravenous On call to O.R. 07/01/21 0701 07/01/21 1515              Family Communication/Anticipated D/C date and plan/Code Status   DVT prophylaxis: SCDs Start: 07/01/21 1518     Code Status: Full Code  Family Communication: Plan discussed with her sister at the bedside Disposition Plan:    Status is: Inpatient  Remains inpatient appropriate because:IV treatments appropriate due to intensity of illness or inability to take PO and Inpatient level of care appropriate due to severity of illness  Dispo: The patient is from: Home              Anticipated d/c is to: SNF              Patient currently is not medically stable to d/c.   Difficult to place patient No           Subjective:   C/o pain, swelling and redness over the left knee.  Her sister was at the bedside.  She had a syncopal episode from orthostatic hypotension while working with PT this morning.  Objective:    Vitals:   07/07/21 1138 07/07/21 1300 07/07/21 2204 07/08/21 0700  BP: 136/86 (!) 144/60 (!) 132/55 (!) 119/56  Pulse:  88 89 79  Resp:  '19 18 18  '$ Temp:  98.6 F (37 C) 99.9 F (37.7 C) 99.3 F (37.4 C)  TempSrc:  Oral Oral Oral  SpO2:  100% 98% 100%  Weight:      Height:       Orthostatic VS for the past 24 hrs:  BP- Lying BP- Sitting BP- Standing at 0 minutes  07/08/21 1059 -- -- (!) 89/71  07/08/21 1058 134/58 130/80 110/70         Intake/Output Summary (Last 24 hours) at 07/08/2021 1420 Last data filed at 07/08/2021 1014 Gross per 24 hour  Intake 1123.14 ml  Output --  Net 1123.14 ml   Filed Weights   07/01/21 0700 07/01/21 0933  Weight: 93.4 kg 90.7 kg    Exam:  GEN: NAD SKIN: Warm and dry EYES: EOMI ENT: MMM CV: RRR PULM: CTA B ABD: soft, obese, NT, +BS CNS: AAO x 3, non focal EXT: Swelling, tenderness and erythema over the left knee.  Incisional wound is  intact with no drainage.        Data Reviewed:   I have personally reviewed following labs and imaging studies:  Labs: Labs show the following:   Basic Metabolic Panel: Recent Labs  Lab 07/02/21 0241 07/03/21 0335 07/04/21 0250  NA 137 140  --   K 4.4 3.8  --  CL 104 107  --   CO2 27 26  --   GLUCOSE 155* 107*  --   BUN 14 14  --   CREATININE 0.67 0.79  --   CALCIUM 8.9 8.4*  --   MG 2.0 1.8 1.8  PHOS 2.5 3.0  --    GFR Estimated Creatinine Clearance: 84.8 mL/min (by C-G formula based on SCr of 0.79 mg/dL). Liver Function Tests: Recent Labs  Lab 07/02/21 0241 07/03/21 0335  AST 20 18  ALT 16 14  ALKPHOS 35* 40  BILITOT 0.4 0.3  PROT 6.0* 5.6*  ALBUMIN 3.0* 2.8*   No results for input(s): LIPASE, AMYLASE in the last 168 hours. No results for input(s): AMMONIA in the last 168 hours. Coagulation profile No results for input(s): INR, PROTIME in the last 168 hours.  CBC: Recent Labs  Lab 07/02/21 0241 07/03/21 0335 07/04/21 0250 07/07/21 2251  WBC 10.0 12.4* 8.8 10.6*  NEUTROABS 8.8* 9.5* 5.2 7.1  HGB 9.6* 9.5* 9.5* 10.7*  HCT 29.0* 28.4* 29.6* 32.0*  MCV 94.2 95.0 95.5 93.3  PLT 291 319 335 384   Cardiac Enzymes: No results for input(s): CKTOTAL, CKMB, CKMBINDEX, TROPONINI in the last 168 hours. BNP (last 3 results) No results for input(s): PROBNP in the last 8760 hours. CBG: No results for input(s): GLUCAP in the last 168 hours. D-Dimer: No results for input(s): DDIMER in the last 72 hours. Hgb A1c: No results for input(s): HGBA1C in the last 72 hours. Lipid Profile: No results for input(s): CHOL, HDL, LDLCALC, TRIG, CHOLHDL, LDLDIRECT in the last 72 hours. Thyroid function studies: No results for input(s): TSH, T4TOTAL, T3FREE, THYROIDAB in the last 72 hours.  Invalid input(s): FREET3 Anemia work up: No results for input(s): VITAMINB12, FOLATE, FERRITIN, TIBC, IRON, RETICCTPCT in the last 72 hours. Sepsis Labs: Recent Labs  Lab  07/02/21 0241 07/03/21 0335 07/04/21 0250 07/07/21 2251  WBC 10.0 12.4* 8.8 10.6*    Microbiology Recent Results (from the past 240 hour(s))  Culture, blood (routine x 2)     Status: None (Preliminary result)   Collection Time: 07/07/21 10:51 PM   Specimen: BLOOD  Result Value Ref Range Status   Specimen Description BLOOD RIGHT ANTECUBITAL  Final   Special Requests   Final    BOTTLES DRAWN AEROBIC AND ANAEROBIC Blood Culture adequate volume   Culture   Final    NO GROWTH < 12 HOURS Performed at [facility], [address] [place], [place] [address]    Report Status PENDING  Incomplete  Culture, blood (routine x 2)     Status: None (Preliminary result)   Collection Time: 07/07/21 10:51 PM   Specimen: BLOOD RIGHT HAND  Result Value Ref Range Status   Specimen Description BLOOD RIGHT HAND  Final   Special Requests   Final    BOTTLES DRAWN AEROBIC AND ANAEROBIC Blood Culture adequate volume   Culture   Final    NO GROWTH < 12 HOURS Performed at [facility], [place] [address] [place], [place] [address]    Report Status PENDING  Incomplete    Procedures and diagnostic studies:  No results found.             LOS: 11 days   [place] Copy[employer], advertising on [email or web address]. If 7PM-7AM, please contact night-coverage at [email or web address]     07/08/2021, 2:20 PM

## 2021-07-08 NOTE — Progress Notes (Addendum)
Physical Therapy Treatment Patient Details Name: Courtney Berg MRN: [id number] DOB: 06-Jul-1956 Today's Date: 07/08/2021    History of Present Illness The pt is a 65 year old female who presented to the ED 8/5 after fall downstairs at work, left knee x-ray showed patella fracture, right ankle and foot x-ray showed distal tibia and fibula fracture and dislocated ankle. Pt now s/p ORIF of R ankle and L patella 8/10. PMH includes diabetes, hypertension, cancer.    PT Comments    Pt limited in mobility by symptomatic orthostatic hypotension. Pt became unresponsive with a significant BP decline following a transfer bed > recliner, MD and RN present during and assisting PT. After > ~30 seconds she began to follow commands and was A&Ox4 again. See General Comments below in regards to BP. Due to this significant BP decline with standing, decided to transfer pt with TA x4 using bed sheet to lift and slide pt from recliner back to bed. Educated pt and her sister on gradually increasing the [facility], monitoring symptoms, and lowering HOB if symptoms increase and persist. Pt is very motivated to participate and improve despite these limitations, displaying good carryover of techniques practiced in prior sessions. Will continue to follow acutely. Continue to recommend CIR follow-up due to her significant change in level of function and strong desire to return to being independent.     Follow Up Recommendations  CIR     Equipment Recommendations  Wheelchair (measurements PT);Wheelchair cushion (measurements PT)    Recommendations for Other Services       Precautions / Restrictions Precautions Precautions: Fall Precaution Comments: orthostatic hypotension Required Braces or Orthoses: Other Brace (L knee bledsoe locked in extension.) Knee Immobilizer - Left: On at all times Other Brace: CAM walker boot RLE-when OOB- can be off in bed. Restrictions Weight Bearing Restrictions: Yes RLE Weight Bearing:  Non weight bearing (able to bear weight on R knee on platform of knee scooter) LLE Weight Bearing: Weight bearing as tolerated Other Position/Activity Restrictions: pt is allowed gentle ROM to R ankle out of CAM boot when at rest with leg supported    Mobility  Bed Mobility Overal bed mobility: Needs Assistance Bed Mobility: Supine to Sit     Supine to sit: HOB elevated;Min guard     General bed mobility comments: Used gt belt attached to bledsoe brace to move LLE to edge of bed. Pt uses bed rails and controls to transition supine > sit EOB.    Transfers Overall transfer level: Needs assistance Equipment used: 2 person hand held assist Transfers: Sit to/from [facility] Sit to Stand: Mod assist;+2 physical assistance;From elevated surface Stand pivot transfers: Max assist;+2 physical assistance;+2 safety/equipment       General transfer comment: ModAx2 to rise to stand on L leg with knee extended in bledsoe brace from elevated EOB, cuing pt to lean laterally to L to lift R foot off ground. MaxAx2 to pivot to L on L foot to chair. Pt's BP did decrease in standing with some dizziness noted, but pt able to follow commands until seated. Once seated pt became unresponsive with eyes open, couple jerking motions, pupils dilated, thus immediately reclined pt and rolled her to her side and donned O2 via [place]. After > 30 seconds she began to move her eyes and follow commands, A&Ox4. Pt sweating. Returned pt with +4 TA using sheet under pt back to bed through lateral lift from chair.  Ambulation/Gait  General Gait Details: Unable to take steps today.   Stairs             Wheelchair Mobility    Modified Rankin (Stroke Patients Only)       Balance Overall balance assessment: Needs assistance;History of Falls Sitting-balance support: Feet supported;Bilateral upper extremity supported Sitting balance-Leahy Scale: Poor Sitting balance - Comments: L leg  extended, pt leaning and propping back on bil hands for support.   Standing balance support: Bilateral upper extremity supported;During functional activity Standing balance-Leahy Scale: Poor Standing balance comment: Reliant on bil UE support and mod-maxAx2 for standing.                            Cognition Arousal/Alertness: Awake/alert Behavior During Therapy: WFL for tasks assessed/performed Overall Cognitive Status: Within Functional Limits for tasks assessed                                 General Comments: Pt follows cues and problem-solves appropriately.      Exercises      General Comments General comments (skin integrity, edema, etc.): BP supine 134/58, 130s/80 sitting, 110/70s standing (began to feel dizzy, thus pivoted to chair), once seated in chair pt became unresponsive with eyes open, couple jerking motions, pupils dilated, thus immediately reclined pt and rolled her to her side and donned O2 via [place]. After > 30 seconds she began to move her eyes and follow commands, A&Ox4. Pt sweating. BP was 89/71 sitting in chair. Returned pt to bed with lateral lifting of pt +4 TA      Pertinent Vitals/Pain Pain Assessment: Faces Faces Pain Scale: Hurts even more Pain Location: L knee and R ankle Pain Descriptors / Indicators: Discomfort;Sore;Grimacing;Guarding Pain Intervention(s): Limited activity within patient's tolerance;Monitored during session;Repositioned;Premedicated before session    Home Living                      Prior Function            PT Goals (current goals can now be found in the care plan section) Acute Rehab PT Goals Patient Stated Goal: return to independence PT Goal Formulation: With patient/family Time For Goal Achievement: 07/16/21 Potential to Achieve Goals: Good Progress towards PT goals: Progressing toward goals    Frequency    Min 5X/week      PT Plan Current plan remains appropriate     Co-evaluation              AM-PAC PT "6 Clicks" Mobility   Outcome Measure  Help needed turning from your back to your side while in a flat bed without using bedrails?: A Little Help needed moving from lying on your back to sitting on the side of a flat bed without using bedrails?: A Little Help needed moving to and from a bed to a chair (including a wheelchair)?: Total Help needed standing up from a chair using your arms (e.g., wheelchair or bedside chair)?: Total Help needed to walk in hospital room?: Total Help needed climbing 3-5 steps with a railing? : Total 6 Click Score: 10    End of Session Equipment Utilized During Treatment: Gait belt Activity Tolerance: Other (comment) (limited by orthostatic hypotension) Patient left: in bed;with call bell/phone within reach;with bed alarm set;with family/visitor present;with nursing/sitter in room Nurse Communication: Mobility status;Other (comment) (BP, RN present throughout session and assisting with  pt) PT Visit Diagnosis: Other abnormalities of gait and mobility (R26.89);Muscle weakness (generalized) (M62.81);Pain;Unsteadiness on feet (R26.81);Difficulty in walking, not elsewhere classified (R26.2) Pain - Right/Left: Left Pain - part of body: Knee     Time: [id number] PT Time Calculation (min) (ACUTE ONLY): 42 min  Charges:  $Therapeutic Activity: 38-52 mins                     Moishe Spice, PT, DPT Acute Rehabilitation Services  Pager: [phone] Office: [place] 07/08/2021, 12:16 PM

## 2021-07-08 NOTE — Progress Notes (Signed)
Inpatient Rehab Admissions Coordinator:   Notified that patient has worker's comp.  I called adjuster Micheal Likens [phone]) who requested clinicals to get case started and I let her know that I will be following for CIR and another CM will be following for acute.  Let Whitman Hero know fax number [phone]) for clinicals and I will follow along.    Shann Medal, PT, DPT Admissions Coordinator [phone] 07/08/21  10:49 AM

## 2021-07-09 DIAGNOSIS — S82002A Unspecified fracture of left patella, initial encounter for closed fracture: Secondary | ICD-10-CM | POA: Diagnosis not present

## 2021-07-09 DIAGNOSIS — S82201A Unspecified fracture of shaft of right tibia, initial encounter for closed fracture: Secondary | ICD-10-CM | POA: Diagnosis not present

## 2021-07-09 DIAGNOSIS — S82401A Unspecified fracture of shaft of right fibula, initial encounter for closed fracture: Secondary | ICD-10-CM | POA: Diagnosis not present

## 2021-07-09 DIAGNOSIS — I951 Orthostatic hypotension: Secondary | ICD-10-CM | POA: Diagnosis not present

## 2021-07-09 LAB — CBC WITH DIFFERENTIAL/PLATELET
Abs Immature Granulocytes: 0.07 10*3/uL (ref 0.00–0.07)
Basophils Absolute: 0.1 10*3/uL (ref 0.0–0.1)
Basophils Relative: 1 %
Eosinophils Absolute: 0.2 10*3/uL (ref 0.0–0.5)
Eosinophils Relative: 2 %
HCT: 28.9 % — ABNORMAL LOW (ref 36.0–46.0)
Hemoglobin: 9.7 g/dL — ABNORMAL LOW (ref 12.0–15.0)
Immature Granulocytes: 1 %
Lymphocytes Relative: 18 %
Lymphs Abs: 1.7 10*3/uL (ref 0.7–4.0)
MCH: 31.2 pg (ref 26.0–34.0)
MCHC: 33.6 g/dL (ref 30.0–36.0)
MCV: 92.9 fL (ref 80.0–100.0)
Monocytes Absolute: 1 10*3/uL (ref 0.1–1.0)
Monocytes Relative: 11 %
Neutro Abs: 6.6 10*3/uL (ref 1.7–7.7)
Neutrophils Relative %: 67 %
Platelets: 312 10*3/uL (ref 150–400)
RBC: 3.11 MIL/uL — ABNORMAL LOW (ref 3.87–5.11)
RDW: 13.2 % (ref 11.5–15.5)
WBC: 9.5 10*3/uL (ref 4.0–10.5)
nRBC: 0 % (ref 0.0–0.2)

## 2021-07-09 LAB — BASIC METABOLIC PANEL
Anion gap: 9 (ref 5–15)
BUN: 10 mg/dL (ref 8–23)
CO2: 26 mmol/L (ref 22–32)
Calcium: 8.7 mg/dL — ABNORMAL LOW (ref 8.9–10.3)
Chloride: 102 mmol/L (ref 98–111)
Creatinine, Ser: 0.73 mg/dL (ref 0.44–1.00)
GFR, Estimated: >60 mL/min (ref >60)
Glucose, Bld: 109 mg/dL — ABNORMAL HIGH (ref 70–99)
Potassium: 3.6 mmol/L (ref 3.5–5.1)
Sodium: 137 mmol/L (ref 135–145)

## 2021-07-09 LAB — CORTISOL: Cortisol, Plasma: 9.1 ug/dL

## 2021-07-09 MED ORDER — COSYNTROPIN 0.25 MG IJ SOLR
0.2500 mg | Freq: Once | INTRAMUSCULAR | Status: AC
  Administered 2021-07-10: 0.25 mg via INTRAVENOUS
  Filled 2021-07-09: qty 0.25

## 2021-07-09 NOTE — Progress Notes (Signed)
Orthopedic Tech Progress Note Patient Details:  Courtney Berg 06/13/1956 [id number]  Patient ID: [name], female   DOB: 1956/07/31, 65 y.o.   MRN: [id number]  Chip Boer 07/09/2021, 11:44 AM Trapeze applied for patient

## 2021-07-09 NOTE — Progress Notes (Signed)
Orthopaedic Trauma Progress Note  SUBJECTIVE: Patient visibly looks like she is feeling slightly better today compared to yesterday.  Was able to work with therapies again yesterday afternoon but was noted to have slight drop in blood pressure although was asymptomatic.  States that she feels okay this morning.  Would like to try and sit on the edge of the bed.  I assisted patient to edge of bed after moving from lying position to seated position on EOB blood pressure did drop to 97/84.  After getting situated and resting in the seated position on EOB for approximately 2 minutes patient's blood pressure returned to 124/70.  Patient remained asymptomatic throughout.  Denies any nausea, vomiting, lightheadedness currently. Sister Lelon Frohlich) is at bedside.  Patient was started on midodrine yesterday for blood pressure.  Patient sister notes her concern about this medication and the possible associated side effects.  In terms of bilateral lower extremities, patient notes less swelling about the left knee today and notes it feels less "tight".  Redness still present surrounding incision.  WBC today down to 9.7 from 10.7 yesterday.  No fevers over the last 24 hours.  Has continued on ceftriaxone for cellulitis, planning to switch to Bactrim today. Blood cultures with no growth to date. No issues with right ankle incision but does note the ankle feels stiff.  I have switched to the gait belt from the left lower extremity to the right lower extremity and showed patient how to use this to work on stretching of the Achilles tendon and achieving neutral dorsiflexion with the foot.  OBJECTIVE:  Vitals:   07/08/21 2100 07/09/21 0839  BP: (!) 116/52 (!) 118/47  Pulse: 86 77  Resp: 18 15  Temp: 98.8 F (37.1 C)   SpO2: 100% 99%    General: Sitting up on EOB comfortably upon leaving the room, NAD Respiratory: No increased work of breathing.  LLE: Hinge brace in place.  Incision with some continued redness to the area.   No drainage. Notable bruising and swelling posterior knee and lower leg. Able to wiggle toes.  Ankle dorsiflexion/plantarflexion intact.  Endorses sensation to light touch throughout the extremity.  Compartments soft and compressible. + DP pulse  RLE: Cam boot not currently in place.  Incisions clean, dry, intact with no drainage. Bruising about the ankle as expected. Ankle DF/PF intact but stiff. Able to wiggle toes. Endorses sensation to light touch over all aspects of foot. Foot is warm and well-perfused. +DP pulse  IMAGING: Stable post op imaging.   LABS:  Results for orders placed or performed during the hospital encounter of 06/26/21 (from the past 24 hour(s))  Basic metabolic panel     Status: Abnormal   Collection Time: 07/09/21  3:32 AM  Result Value Ref Range   Sodium 137 135 - 145 mmol/L   Potassium 3.6 3.5 - 5.1 mmol/L   Chloride 102 98 - 111 mmol/L   CO2 26 22 - 32 mmol/L   Glucose, Bld 109 (H) 70 - 99 mg/dL   BUN 10 8 - 23 mg/dL   Creatinine, Ser 0.73 0.44 - 1.00 mg/dL   Calcium 8.7 (L) 8.9 - 10.3 mg/dL   GFR, Estimated >60 >60 mL/min   Anion gap 9 5 - 15  Cortisol, Random     Status: None   Collection Time: 07/09/21  3:32 AM  Result Value Ref Range   Cortisol, Plasma 9.1 ug/dL  CBC with Differential/Platelet     Status: Abnormal   Collection Time:  07/09/21  3:32 AM  Result Value Ref Range   WBC 9.5 4.0 - 10.5 K/uL   RBC 3.11 (L) 3.87 - 5.11 MIL/uL   Hemoglobin 9.7 (L) 12.0 - 15.0 g/dL   HCT 28.9 (L) 36.0 - 46.0 %   MCV 92.9 80.0 - 100.0 fL   MCH 31.2 26.0 - 34.0 pg   MCHC 33.6 30.0 - 36.0 g/dL   RDW 13.2 11.5 - 15.5 %   Platelets 312 150 - 400 K/uL   nRBC 0.0 0.0 - 0.2 %   Neutrophils Relative % 67 %   Neutro Abs 6.6 1.7 - 7.7 K/uL   Lymphocytes Relative 18 %   Lymphs Abs 1.7 0.7 - 4.0 K/uL   Monocytes Relative 11 %   Monocytes Absolute 1.0 0.1 - 1.0 K/uL   Eosinophils Relative 2 %   Eosinophils Absolute 0.2 0.0 - 0.5 K/uL   Basophils Relative 1 %    Basophils Absolute 0.1 0.0 - 0.1 K/uL   Immature Granulocytes 1 %   Abs Immature Granulocytes 0.07 0.00 - 0.07 K/uL    ASSESSMENT: Courtney Berg is a 65 y.o. female, 8 Days Post-Op s/p Procedure(s): ORIF RIGHT ANKLE FRACTURE ORIF LEFT PATELLA  CV/Blood loss: Hgb 9.7 this morning.  PLAN: Weightbearing: NWB RLE, WBAT LLE with hinge brace locked in full extension ROM: Ok for R ankle ROM as tolerated at rest Incisional and dressing care:  Ok to leave open to air Showering: Ok to shower with assistance. Incisions may get wet Orthopedic device(s): CAM boot RLE when OOB, Bledsoe brace LLE Pain management:  1. Tylenol 325-650 mg q 6 hours PRN 2. Robaxin 500 mg q 6 hours PRN 3. Norco 5-325 mg q 4 hours PRN moderate pain OR Norco 7.5-325 mg q 4 hours PRN severe pain  4. Dilaudid 0.5-1 mg q 4 hours PRN VTE prophylaxis: Lovenox, SCDs ID:  Ancef 2gm post op completed. Started on Ceftriaxone 2 gm q 24hrs for cellulitis starting 07/07/21.  Switch to Bactrim DS today Foley/Lines:  No foley, KVO IVFs Impediments to Fracture Healing: Vit D level 19, continue on D3 supplementation. Recommend continuing this at discharge  Dispo: Therapies as tolerated, PT/OT recommending CIR. Patient initially denied. TOC involved and working on this - Continue to monitor orthostatic vitals. - Switch patient from Ceftriaxone 2 gm q 24 hours to Bactrim DS BID x 5 days starting today. - Ortho team will continue to evaluate patient daily for improvements in L knee cellulitis.   Family Communication: Discussed plan today with patient's sister Lelon Frohlich at bedside. She plans to be present tomorrow during exam as well.  Follow - up plan:  2 weeks after d/c for repeat x-rays and wound check  Contact information:  Katha Hamming MD, Patrecia Pace PA-C. After hours and holidays please check [email or web address] for group call information for [facility]   Amarachukwu Lakatos A. Ricci Barker, PA-C [phone] (office) [email or web address]

## 2021-07-09 NOTE — Progress Notes (Addendum)
Progress Note    Courtney Berg  [id number] DOB: Oct 30, 1956  DOA: 06/26/2021 PCP: Susy Frizzle, MD      Brief Narrative:    Medical records reviewed and are as summarized below:  Courtney Berg is a 65 y.o. female with medical history significant for environmental allergies, who presented to the hospital after a fall on the stairs at work.  Work-up revealed right bimalleolar ankle fracture dislocation and left patella fracture.  She underwent ORIF of right bimalleolar ankle fracture and ORIF of left patella on 07/01/2021.  She developed postoperative left knee cellulitis that was treated with antibiotics.  Hospital course was complicated by syncope and orthostatic hypotension.    Assessment/Plan:   Principal Problem:   Tibia/fibula fracture, right, closed, initial encounter Active Problems:   Allergies   Left patella fracture   Ankle dislocation, right, initial encounter   Closed fracture of right fibula and tibia   Elective surgery   Fall   Orthostatic hypotension    Body mass index is 30.41 kg/m.  (Obesity)    Right bimalleolar ankle fracture dislocation: S/p ORIF of right bimalleolar ankle fracture on 07/01/2021.  Analgesics as needed for pain.  Left patella fracture, left knee cellulitis: S/p ORIF of left patella fracture on 07/01/2021.  IV Rocephin has been switched to Bactrim by orthopedic surgery team.  Continue analgesics as needed for pain.  S/p syncope (on 8/17) from orthostatic hypotension: Cortisol level was 9.1.  Plan for ACTH stimulation test tomorrow morning.  Continue midodrine.  Discontinue IV fluids.  Monitor orthostatic vital signs.  Acute urinary retention: In and out urethral catheterization as needed.  Prediabetes, hyperglycemia: Hemoglobin A1c 5.8.  Close outpatient follow-up with PCP recommended.  Vitamin B12 deficiency and vitamin D deficiency: B12 level was 133 vitamin D level was 19.211.  Continue vitamin B12 and vitamin D  supplements.    Iron deficiency anemia: Iron level is 21, ferritin is 170 and percentage iron saturation is 8.  Constipation: Improved.  Continue laxatives as needed.  Hyponatremia: resolved   Diet Order             Diet - low sodium heart healthy           Diet regular Room service appropriate? Yes; Fluid consistency: Thin  Diet effective now                      Consultants: Orthopedic surgeon  Procedures: S/p ORIF right bimalleolar ankle fracture dislocation and s/p ORIF left patella knee fracture on 07/01/2021    Medications:    cholecalciferol  2,000 Units Oral Daily   [START ON 07/10/2021] cosyntropin  0.25 mg Intravenous Once   iron polysaccharides  150 mg Oral Daily   midodrine  5 mg Oral TID WC   polyethylene glycol  17 g Oral BID   senna-docusate  1 tablet Oral BID   sulfamethoxazole-trimethoprim  1 tablet Oral Q12H   vitamin B-12  1,000 mcg Oral Daily   Continuous Infusions:  methocarbamol (ROBAXIN) IV       Anti-infectives (From admission, onward)    Start     Dose/Rate Route Frequency Ordered Stop   07/09/21 2200  sulfamethoxazole-trimethoprim (BACTRIM DS) 800-160 MG per tablet 1 tablet        1 tablet Oral Every 12 hours 07/08/21 1309 07/14/21 2159   07/08/21 2300  cefTRIAXone (ROCEPHIN) 2 g in sodium chloride 0.9 % 100 mL IVPB  2 g 200 mL/hr over 30 Minutes Intravenous Every 24 hours 07/08/21 1309 07/08/21 2144   07/07/21 2300  cefTRIAXone (ROCEPHIN) 2 g in sodium chloride 0.9 % 100 mL IVPB  Status:  Discontinued        2 g 200 mL/hr over 30 Minutes Intravenous Every 24 hours 07/07/21 2207 07/08/21 1309   07/01/21 1615  ceFAZolin (ANCEF) IVPB 2g/100 mL premix        2 g 200 mL/hr over 30 Minutes Intravenous Every 8 hours 07/01/21 1518 07/02/21 0637   07/01/21 1305  vancomycin (VANCOCIN) powder  Status:  Discontinued          As needed 07/01/21 1317 07/01/21 1346   07/01/21 0800  ceFAZolin (ANCEF) IVPB 2g/100 mL premix  Status:   Discontinued        2 g 200 mL/hr over 30 Minutes Intravenous On call to O.R. 07/01/21 0701 07/01/21 1515              Family Communication/Anticipated D/C date and plan/Code Status   DVT prophylaxis: SCDs Start: 07/01/21 1518     Code Status: Full Code  Family Communication: None Disposition Plan:    Status is: Inpatient  Remains inpatient appropriate because:IV treatments appropriate due to intensity of illness or inability to take PO and Inpatient level of care appropriate due to severity of illness  Dispo: The patient is from: Home              Anticipated d/c is to: SNF              Patient currently is not medically stable to d/c.   Difficult to place patient No           Subjective:   C/o pain in the left knee and pain in the right ankle  Objective:    Vitals:   07/08/21 0700 07/08/21 1500 07/08/21 2100 07/09/21 0839  BP: (!) 119/56 135/65 (!) 116/52 (!) 118/47  Pulse: 79 (!) 104 86 77  Resp: '18  18 15  '$ Temp: 99.3 F (37.4 C)  98.8 F (37.1 C) 98.2 F (36.8 C)  TempSrc: Oral  Oral   SpO2: 100% 97% 100% 99%  Weight:      Height:       No data found.        Intake/Output Summary (Last 24 hours) at 07/09/2021 1643 Last data filed at 07/09/2021 0900 Gross per 24 hour  Intake 591.02 ml  Output 1200 ml  Net -608.98 ml   Filed Weights   07/01/21 0700 07/01/21 0933  Weight: 93.4 kg 90.7 kg    Exam: GEN: NAD SKIN: Warm and dry EYES: No pallor or icterus ENT: MMM CV: RRR PULM: CTA B ABD: soft, obese, NT, +BS CNS: AAO x 3, non focal EXT: Swelling, tenderness and erythema over the left knee.  Incisional wound is intact without any drainage.  Tenderness about the right ankle.  Sutures on the left knee incision and right ankle incisions are all intact.            Data Reviewed:   I have personally reviewed following labs and imaging studies:  Labs: Labs show the following:   Basic Metabolic Panel: Recent Labs  Lab  07/03/21 0335 07/04/21 0250 07/09/21 0332  NA 140  --  137  K 3.8  --  3.6  CL 107  --  102  CO2 26  --  26  GLUCOSE 107*  --  109*  BUN 14  --  10  CREATININE 0.79  --  0.73  CALCIUM 8.4*  --  8.7*  MG 1.8 1.8  --   PHOS 3.0  --   --    GFR Estimated Creatinine Clearance: 84.8 mL/min (by C-G formula based on SCr of 0.73 mg/dL). Liver Function Tests: Recent Labs  Lab 07/03/21 0335  AST 18  ALT 14  ALKPHOS 40  BILITOT 0.3  PROT 5.6*  ALBUMIN 2.8*   No results for input(s): LIPASE, AMYLASE in the last 168 hours. No results for input(s): AMMONIA in the last 168 hours. Coagulation profile No results for input(s): INR, PROTIME in the last 168 hours.  CBC: Recent Labs  Lab 07/03/21 0335 07/04/21 0250 07/07/21 2251 07/09/21 0332  WBC 12.4* 8.8 10.6* 9.5  NEUTROABS 9.5* 5.2 7.1 6.6  HGB 9.5* 9.5* 10.7* 9.7*  HCT 28.4* 29.6* 32.0* 28.9*  MCV 95.0 95.5 93.3 92.9  PLT 319 335 384 312   Cardiac Enzymes: No results for input(s): CKTOTAL, CKMB, CKMBINDEX, TROPONINI in the last 168 hours. BNP (last 3 results) No results for input(s): PROBNP in the last 8760 hours. CBG: No results for input(s): GLUCAP in the last 168 hours. D-Dimer: No results for input(s): DDIMER in the last 72 hours. Hgb A1c: No results for input(s): HGBA1C in the last 72 hours. Lipid Profile: No results for input(s): CHOL, HDL, LDLCALC, TRIG, CHOLHDL, LDLDIRECT in the last 72 hours. Thyroid function studies: No results for input(s): TSH, T4TOTAL, T3FREE, THYROIDAB in the last 72 hours.  Invalid input(s): FREET3 Anemia work up: No results for input(s): VITAMINB12, FOLATE, FERRITIN, TIBC, IRON, RETICCTPCT in the last 72 hours. Sepsis Labs: Recent Labs  Lab 07/03/21 0335 07/04/21 0250 07/07/21 2251 07/09/21 0332  WBC 12.4* 8.8 10.6* 9.5    Microbiology Recent Results (from the past 240 hour(s))  Culture, blood (routine x 2)     Status: None (Preliminary result)   Collection Time: 07/07/21  10:51 PM   Specimen: BLOOD  Result Value Ref Range Status   Specimen Description BLOOD RIGHT ANTECUBITAL  Final   Special Requests   Final    BOTTLES DRAWN AEROBIC AND ANAEROBIC Blood Culture adequate volume   Culture   Final    NO GROWTH 2 DAYS Performed at [facility], [address], [place], [place] [address]    Report Status PENDING  Incomplete  Culture, blood (routine x 2)     Status: None (Preliminary result)   Collection Time: 07/07/21 10:51 PM   Specimen: BLOOD RIGHT HAND  Result Value Ref Range Status   Specimen Description BLOOD RIGHT HAND  Final   Special Requests   Final    BOTTLES DRAWN AEROBIC AND ANAEROBIC Blood Culture adequate volume   Culture   Final    NO GROWTH 2 DAYS Performed at [facility], [place] [address] [place], [place] [address]    Report Status PENDING  Incomplete    Procedures and diagnostic studies:  No results found.             LOS: 12 days   [employer], advertising on [email or web address]. If 7PM-7AM, please contact night-coverage at [email or web address]     07/09/2021, 4:43 PM

## 2021-07-09 NOTE — Plan of Care (Signed)
Both incisions OTA. Left knee red, swollen, and warm to touch. Right left incisions has a little redness around incisional site. Patient stable will continue to monitor.    Problem: Activity: Goal: Ability to increase mobility will improve Outcome: Progressing   Problem: Pain Management: Goal: Pain level will decrease with appropriate interventions Outcome: Progressing   Problem: Skin Integrity: Goal: Will show signs of wound healing Outcome: Progressing

## 2021-07-09 NOTE — Plan of Care (Signed)
  Problem: Pain Management: Goal: Pain level will decrease with appropriate interventions Outcome: Progressing   Problem: Skin Integrity: Goal: Will show signs of wound healing Outcome: Progressing   

## 2021-07-09 NOTE — Plan of Care (Signed)
  Problem: Activity: Goal: Ability to increase mobility will improve Outcome: Adequate for Discharge   Problem: Physical Regulation: Goal: Postoperative complications will be avoided or minimized Outcome: Adequate for Discharge   Problem: Pain Management: Goal: Pain level will decrease with appropriate interventions Outcome: Adequate for Discharge

## 2021-07-10 ENCOUNTER — Inpatient Hospital Stay (HOSPITAL_COMMUNITY): Payer: Worker's Compensation

## 2021-07-10 DIAGNOSIS — R55 Syncope and collapse: Secondary | ICD-10-CM

## 2021-07-10 LAB — ECHOCARDIOGRAM COMPLETE
Area-P 1/2: 3.12 cm2
Calc EF: 65.1 %
Height: 68 in
S' Lateral: 2.6 cm
Single Plane A2C EF: 60.7 %
Single Plane A4C EF: 70.6 %
Weight: 3200 oz

## 2021-07-10 LAB — ACTH STIMULATION, 3 TIME POINTS
Cortisol, 30 Min: 25.2 ug/dL
Cortisol, 60 Min: 28.6 ug/dL
Cortisol, Base: 21.5 ug/dL

## 2021-07-10 MED ORDER — MIDODRINE HCL 5 MG PO TABS
10.0000 mg | ORAL_TABLET | Freq: Three times a day (TID) | ORAL | Status: DC
  Administered 2021-07-10 – 2021-07-13 (×10): 10 mg via ORAL
  Filled 2021-07-10 (×10): qty 2

## 2021-07-10 NOTE — Progress Notes (Signed)
Physical Therapy Treatment Patient Details Name: Courtney Berg MRN: [id number] DOB: [birth year] Today's Date: 07/10/2021    History of Present Illness The pt is a 65 year old female who presented to the ED 8/5 after fall downstairs at work, left knee x-ray showed patella fracture, right ankle and foot x-ray showed distal tibia and fibula fracture and dislocated ankle. Pt now s/p ORIF of R ankle and L patella 8/10. PMH includes diabetes, hypertension, cancer.    PT Comments    Pt displayed improved BP stability with mobility this date, see below. Pt only needs minA to laterally scoot on EOB but maxAx2 to perform sit to stand or stand pivot transfers. Ended session with lower extremity exercises to improve strength and stability. Will continue to follow acutely. Current recommendations remain appropriate.  BP: 125/51 supine 145/66 sitting EOB (pt reported feeling hot) 154/75 sitting EOB several minutes 143/70 sitting after first standing bout 146/68 sitting after stand pivot transfer (pt dizzy) 127/74 reclined in chair end of session    Follow Up Recommendations  CIR     Equipment Recommendations  Wheelchair (measurements PT);Wheelchair cushion (measurements PT)    Recommendations for Other Services       Precautions / Restrictions Precautions Precautions: Fall Precaution Comments: orthostatic hypotension Required Braces or Orthoses: Other Brace (L knee bledsoe locked in extension.) Knee Immobilizer - Left: On at all times Other Brace: CAM walker boot RLE-when OOB- can be off in bed. Restrictions Weight Bearing Restrictions: Yes RLE Weight Bearing: Non weight bearing (able to bear weight on R knee on platform of knee scooter) LLE Weight Bearing: Weight bearing as tolerated Other Position/Activity Restrictions: pt is allowed gentle ROM to R ankle out of CAM boot when at rest with leg supported    Mobility  Bed Mobility Overal bed mobility: Needs Assistance Bed Mobility:  Supine to Sit     Supine to sit: HOB elevated;Supervision     General bed mobility comments: Pt able to transition supine > sit L EOB with use of rails and HOB elevated without use of gait belt to assist in lifting her L leg today, extra time, supervision for safety.    Transfers Overall transfer level: Needs assistance Equipment used: 2 person hand held assist Transfers: Sit to/from [facility];Lateral/Scoot Transfers Sit to Stand: +2 physical assistance;From elevated surface;Max assist Stand pivot transfers: Max assist;+2 physical assistance;+2 safety/equipment      Lateral/Scoot Transfers: Min assist General transfer comment: Sit to stand 2x from EOB with maxAx2 to assist in anterior weight shift of body onto L leg locked in extension and to keep R foot off ground. MinA to scoot laterally to L on bed 3x. Stand pivot after scooting on EOB close to recliner, maxAx2.  Ambulation/Gait             General Gait Details: Unable to take steps today.   Stairs             Wheelchair Mobility    Modified Rankin (Stroke Patients Only)       Balance Overall balance assessment: Needs assistance;History of Falls Sitting-balance support: Feet supported;Bilateral upper extremity supported;Single extremity supported Sitting balance-Leahy Scale: Poor Sitting balance - Comments: L leg extended, pt leaning and propping back on hands for support.   Standing balance support: Bilateral upper extremity supported;During functional activity Standing balance-Leahy Scale: Poor Standing balance comment: Reliant on bil UE support and mod-maxAx2 for standing.  Cognition Arousal/Alertness: Awake/alert Behavior During Therapy: WFL for tasks assessed/performed Overall Cognitive Status: Within Functional Limits for tasks assessed                                 General Comments: Pt follows cues and problem-solves  appropriately.      Exercises General Exercises - Lower Extremity Quad Sets: Both;10 reps;Supine;Strengthening Hip ABduction/ADduction: Strengthening;Both;10 reps;Supine Straight Leg Raises: Strengthening;Both;10 reps;Supine    General Comments General comments (skin integrity, edema, etc.): BP: 125/51 supine, 145/66 sitting EOB (pt reported feeling hot), 154/75 sitting EOB several minutes, 143/70 sitting after first standing bout, 146/68 sitting after stand pivot transfer (pt reported feeling dizzy), 127/74 reclined in chair end of session; RN made aware      Pertinent Vitals/Pain Pain Assessment: Faces Faces Pain Scale: Hurts even more Pain Location: L knee and R ankle Pain Descriptors / Indicators: Discomfort;Sore;Grimacing;Guarding Pain Intervention(s): Limited activity within patient's tolerance;Monitored during session;Repositioned    Home Living                      Prior Function            PT Goals (current goals can now be found in the care plan section) Acute Rehab PT Goals Patient Stated Goal: return to independence PT Goal Formulation: With patient/family Time For Goal Achievement: 07/16/21 Potential to Achieve Goals: Good Progress towards PT goals: Progressing toward goals    Frequency    Min 5X/week      PT Plan Current plan remains appropriate    Co-evaluation              AM-PAC PT "6 Clicks" Mobility   Outcome Measure  Help needed turning from your back to your side while in a flat bed without using bedrails?: A Little Help needed moving from lying on your back to sitting on the side of a flat bed without using bedrails?: A Little Help needed moving to and from a bed to a chair (including a wheelchair)?: Total Help needed standing up from a chair using your arms (e.g., wheelchair or bedside chair)?: Total Help needed to walk in hospital room?: Total Help needed climbing 3-5 steps with a railing? : Total 6 Click Score: 10     End of Session Equipment Utilized During Treatment: Gait belt Activity Tolerance: Other (comment) (limited by pain and BP) Patient left: with call bell/phone within reach;with family/visitor present;in chair;with chair alarm set Nurse Communication: Mobility status;Other (comment) (BP and symptoms during session) PT Visit Diagnosis: Other abnormalities of gait and mobility (R26.89);Muscle weakness (generalized) (M62.81);Pain;Unsteadiness on feet (R26.81);Difficulty in walking, not elsewhere classified (R26.2) Pain - Right/Left: Left Pain - part of body: Knee     Time: [id number] PT Time Calculation (min) (ACUTE ONLY): 41 min  Charges:  $Therapeutic Exercise: 8-22 mins $Therapeutic Activity: 23-37 mins                     Moishe Spice, PT, DPT Acute Rehabilitation Services  Pager: [phone] Office: [place] 07/10/2021, 3:37 PM

## 2021-07-10 NOTE — PMR Pre-admission (Signed)
PMR Admission Coordinator Pre-Admission Assessment  Patient: Courtney Berg is an 65 y.o., female MRN: [id number] DOB: 01-11-1956 Height: '5\' 8"'  (172.7 cm) Weight: 90.7 kg  Insurance Information HMO:     PPO:      PCP:      IPA:      80/20:      OTHER:  PRIMARY: Worker's Comp      Policy#:       Subscriber:  CM NameArbie Cookey      Phone#: [phone]     Fax#: [phone] Pre-Cert#: [id number] auth for CIR from Arbie Cookey   for 7 days with follow up   Employer:  Benefits:  Phone #:      Name:  Eff. Date:      Deduct:       Out of Pocket Max:       Life Max:  CIR:       SNF:  Outpatient:      Co-Pay:  Home Health:       Co-Pay:  DME:      Co-Pay:  Providers:  SECONDARY:       Policy#:      Phone#:   Development worker, community:       Phone#:   The Therapist, art Information Summary" for patients in Inpatient Rehabilitation Facilities with attached "Privacy Act [place] Records" was provided and verbally reviewed with: n/a  Emergency Contact Information Contact Information     Name Relation Home Work Mobile   Cassells,William Spouse   [phone]       Current Medical History  Patient Admitting Diagnosis: poly trauma   History of Present Illness: Rome Schlauch. Gascoigne is a 65 year old right-handed female with history of tobacco use on no prescription medications.  Presented 06/26/2021 after reported fall down approximately 3 stairs while at work.  Denied loss of consciousness.  Admission chemistries unremarkable except WBC 14,600.  Sustained right bimalleolar ankle fracture/dislocation as well as left patella fracture.  Underwent ORIF of right ankle fracture repair of right ankle syndesmosis as well as open reduction fixation left patella 07/01/2021 per Dr. Doreatha Martin.  She is nonweightbearing right lower(able to bear weight on right knee on platform of knee scooter) extremity and weightbearing as tolerated left lower extremity with hinged brace locked in full extension.  Patient is allowed  gentle range of motion to right ankle out of cam boot when at rest with leg supported.  Patient did spike a low-grade fever concern for left knee cellulitis initially placed on ceftriaxone plan to switch to Bactrim x5 days 07/09/2021.  Lovenox for DVT prophylaxis.  Therapy evaluations completed due to patient's decreased functional mobility was recommended for a comprehensive rehab program.    Patient's medical record from [facility] has been reviewed by the rehabilitation admission coordinator and physician.  Past Medical History  Past Medical History:  Diagnosis Date   Environmental allergies    pt still getting workup [id number]   Miscarriage     Family History   family history includes Cancer in her father and sister; Colon cancer in her father; Diabetes in her brother and mother; Hypertension in her father.  Prior Rehab/Hospitalizations Has the patient had prior rehab or hospitalizations prior to admission? No  Has the patient had major surgery during 100 days prior to admission? Yes   Current Medications  Current Facility-Administered Medications:    acetaminophen (TYLENOL) tablet 325-650 mg, 325-650 mg, Oral, Q6H PRN, Delray Alt, PA-C, 650 mg at 07/13/21 0815   bisacodyl (  DULCOLAX) suppository 10 mg, 10 mg, Rectal, Daily PRN, Raiford Noble Latif, DO, 10 mg at 07/05/21 2003   cholecalciferol (VITAMIN D3) tablet 2,000 Units, 2,000 Units, Oral, Daily, Delray Alt, PA-C, 2,000 Units at 07/13/21 1010   HYDROcodone-acetaminophen (NORCO) 7.5-325 MG per tablet 1-2 tablet, 1-2 tablet, Oral, Q4H PRN, Delray Alt, PA-C, 2 tablet at 07/03/21 0159   HYDROcodone-acetaminophen (NORCO/VICODIN) 5-325 MG per tablet 1-2 tablet, 1-2 tablet, Oral, Q4H PRN, Delray Alt, PA-C, 2 tablet at 07/08/21 1008   iron polysaccharides (NIFEREX) capsule 150 mg, 150 mg, Oral, Daily, Sheikh, Omair Latif, DO, 150 mg at 07/13/21 1010   methocarbamol (ROBAXIN) tablet 500 mg, 500 mg, Oral, Q6H PRN,  500 mg at 07/03/21 1722 **OR** methocarbamol (ROBAXIN) 500 mg in dextrose 5 % 50 mL IVPB, 500 mg, Intravenous, Q6H PRN, Ricci Barker, Sarah A, PA-C   metoCLOPramide (REGLAN) tablet 5-10 mg, 5-10 mg, Oral, Q8H PRN **OR** metoCLOPramide (REGLAN) injection 5-10 mg, 5-10 mg, Intravenous, Q8H PRN, Ricci Barker, Sarah A, PA-C   midodrine (PROAMATINE) tablet 10 mg, 10 mg, Oral, TID WC, Jennye Boroughs, MD, 10 mg at 07/13/21 0815   ondansetron (ZOFRAN) tablet 4 mg, 4 mg, Oral, Q6H PRN **OR** ondansetron (ZOFRAN) injection 4 mg, 4 mg, Intravenous, Q6H PRN, Yacobi, Sarah A, PA-C   polyethylene glycol (MIRALAX / GLYCOLAX) packet 17 g, 17 g, Oral, BID, Sheikh, Omair Latif, DO, 17 g at 07/09/21 0831   senna-docusate (Senokot-S) tablet 1 tablet, 1 tablet, Oral, BID, Raiford Noble Latif, DO, 1 tablet at 07/09/21 7322   sulfamethoxazole-trimethoprim (BACTRIM DS) 800-160 MG per tablet 1 tablet, 1 tablet, Oral, Q12H, Yacobi, Sarah A, PA-C, 1 tablet at 07/13/21 1010   vitamin B-12 (CYANOCOBALAMIN) tablet 1,000 mcg, 1,000 mcg, Oral, Daily, Sheikh, Omair Latif, DO, 1,000 mcg at 07/13/21 1010  Patients Current Diet:  Diet Order             Diet - low sodium heart healthy           Diet regular Room service appropriate? Yes; Fluid consistency: Thin  Diet effective now                  Precautions / Restrictions Precautions Precautions: Fall Precaution Comments: orthostatic hypotension Other Brace: CAM walker boot RLE-when OOB- can be off in bed. Restrictions Weight Bearing Restrictions: Yes RLE Weight Bearing: Non weight bearing LLE Weight Bearing: Non weight bearing Other Position/Activity Restrictions: pt is allowed gentle ROM to R ankle out of CAM boot when at rest with leg supported   Has the patient had 2 or more falls or a fall with injury in the past year? Yes  Prior Activity Level Community (5-7x/wk): working full time, no limitations, no DME, driving  Prior Functional Level Self Care: Did the patient  need help bathing, dressing, using the toilet or eating? Independent  Indoor Mobility: Did the patient need assistance with walking from room to room (with or without device)? Independent  Stairs: Did the patient need assistance with internal or external stairs (with or without device)? Independent  Functional Cognition: Did the patient need help planning regular tasks such as shopping or remembering to take medications? Independent  Home Equities trader / Equipment Home Assistive Devices/Equipment: None Home Equipment: Bedside commode, Tub bench, Walker - 2 wheels, Wheelchair - manual, Grab bars - tub/shower, Crutches (WC with no foot rests)  Spouse has multiple DME from his deceased Dad who was wheelchair level  Prior Device Use: Indicate devices/aids used by the patient  prior to current illness, exacerbation or injury? None of the above  Current Functional Level Cognition  Overall Cognitive Status: Within Functional Limits for tasks assessed Orientation Level: Oriented X4 General Comments: Pt follows cues and problem-solves appropriately.    Extremity Assessment (includes Sensation/Coordination)  Upper Extremity Assessment: Overall WFL for tasks assessed  Lower Extremity Assessment: Defer to PT evaluation RLE Deficits / Details: good strength at hip and knee, lower leg NT due to NWB orders and CAM walker boot. Pt unable to wiggle toes or move R ankle RLE: Unable to fully assess due to immobilization RLE Sensation: decreased light touch (no sensation mid-calf down) RLE Coordination: decreased fine motor, decreased gross motor LLE Deficits / Details: pt must be kept in [place] for mobility, difficulty with hip ABD to move OOB. able to maintain standing with KI and no knee buckling. LLE: Unable to fully assess due to immobilization LLE Sensation: WNL    ADLs  Overall ADL's : Needs assistance/impaired Eating/Feeding: Independent, Sitting Grooming: Set up, Sitting Upper Body Bathing:  Set up, Sitting Lower Body Bathing: Minimal assistance, Sitting/lateral leans Lower Body Bathing Details (indicate cue type and reason): min A for rear peri area in sidelying Upper Body Dressing : Min guard, Sitting Lower Body Dressing: Maximal assistance, +2 for safety/equipment, +2 for physical assistance, Sitting/lateral leans, Sit to/from stand Toilet Transfer: Total assistance Toilet Transfer Details (indicate cue type and reason): Pt on bed pan upon arrival, otherwise max A for posterior transfer from bed>BSC Toileting- Clothing Manipulation and Hygiene: Maximal assistance, +2 for physical assistance, +2 for safety/equipment Tub/ Shower Transfer: Maximal assistance, +2 for physical assistance, +2 for safety/equipment, Stand-pivot Functional mobility during ADLs:  (session focused on changing position and management of orthostatic hypotension) General ADL Comments: Pt declined all grooming and ADL needs while sitting EOB    Mobility  Overal bed mobility: Needs Assistance Bed Mobility: Supine to Sit Rolling: Min guard Supine to sit: HOB elevated, Supervision Sit to supine: Min assist General bed mobility comments: pt received seated EOB, RN present and had assisted her up.    Transfers  Overall transfer level: Needs assistance Equipment used: Rolling walker (2 wheeled) Transfers: Sit to/from Stand, Stand Pivot Transfers Sit to Stand: +2 physical assistance, From elevated surface, Mod assist Stand pivot transfers: +2 physical assistance, +2 safety/equipment, Mod assist, From elevated surface Anterior-Posterior transfers: Mod assist  Lateral/Scoot Transfers: Min assist General transfer comment: Sit to stand from EOB with modA +2 to assist in anterior weight shift of body onto L leg locked in extension and to keep R foot off ground. BP taken in stance. Stand pivot to wheelchair, modA to pivot and chair brought close behind her due to inability to hop on extended LLE.    Ambulation /  Gait / Stairs / Wheelchair Mobility  Ambulation/Gait Ambulation/Gait assistance:  (unable due to soft BP and need to return to sitting.) Gait Distance (Feet): 4 Feet (gt limited due to dizziness.) Assistive device:  (knee walker) Gait Pattern/deviations: Step-to pattern General Gait Details: Unable to take steps today. Product manager mobility: Yes Wheelchair propulsion: Both upper extremities Wheelchair parts: Supervision/cueing Distance: 150 Wheelchair Assistance Details (indicate cue type and reason): Cues for locking brakes and cues for use of wheel rails to drive wheels to move forward, backward and for turns. Cues for activity pacing and energy conservation, good carryover.    Posture / Balance Dynamic Sitting Balance Sitting balance - Comments: L leg extended, pt leaning and propping back on hands for support.  Balance Overall balance assessment: Needs assistance, History of Falls Sitting-balance support: Feet supported, Bilateral upper extremity supported, Single extremity supported Sitting balance-Leahy Scale: Poor Sitting balance - Comments: L leg extended, pt leaning and propping back on hands for support. Standing balance support: Bilateral upper extremity supported, During functional activity Standing balance-Leahy Scale: Poor Standing balance comment: Reliant on bil UE support of RW and modA +2 for standing.    Special needs/care consideration Skin: surgical incisions to RLE and LLE   Previous Home Environment  Living Arrangements: Spouse/significant other Available Help at Discharge: Family, Available 24 hours/day Type of Home: House Home Layout: One level Home Access: Stairs to enter, Ramped entrance Entrance Stairs-Rails: Right, Left Entrance Stairs-Number of Steps: 4 Bathroom Shower/Tub: Tub/shower unit, Architectural technologist: [place]: No  Discharge Living Setting Plans for Discharge Living Setting: Patient's home Type of  Home at Discharge: House Discharge Home Layout: One level Discharge Home Access: [place] entrance Discharge Bathroom Shower/Tub: Walk-in shower Discharge Bathroom Toilet: Standard Discharge Bathroom Accessibility: Yes How Accessible: Accessible via wheelchair Does the patient have any problems obtaining your medications?: No  Social/Family/Support Systems Patient Roles: Spouse Anticipated Caregiver: spouse, Gwyndolyn Saxon "Bill" Mccarey Anticipated Caregiver's Contact Information: [phone] Ability/Limitations of Caregiver: mod assist Caregiver Availability: 24/7 Discharge Plan Discussed with Primary Caregiver: Yes Is Caregiver In Agreement with Plan?: Yes Does Caregiver/Family have Issues with Lodging/Transportation while Pt is in Rehab?: No  Goals Patient/Family Goal for Rehab: PT/OT supervision to min, w/c level, SLP n/a Expected length of stay: 10-14 days Pt/Family Agrees to Admission and willing to participate: Yes Program Orientation Provided & Reviewed with Pt/Caregiver Including Roles  & Responsibilities: Yes  Decrease burden of Care through IP rehab admission: n/a  Possible need for SNF placement upon discharge: NO  Patient Condition: I have reviewed medical records from [facility], spoken with CM, and patient and spouse. I met with patient at the bedside for inpatient rehabilitation assessment.  Patient will benefit from ongoing PT and OT, can actively participate in 3 hours of therapy a day 5 days of the week, and can make measurable gains during the admission.  Patient will also benefit from the coordinated team approach during an Inpatient Acute Rehabilitation admission.  The patient will receive intensive therapy as well as Rehabilitation physician, nursing, social worker, and care management interventions.  Due to safety, skin/wound care, medication administration, pain management, and patient education the patient requires 24 hour a day rehabilitation nursing.  The patient is  currently mod +2 with mobility and basic ADLs.  Discharge setting and therapy post discharge at home with home health is anticipated.  Patient has agreed to participate in the Acute Inpatient Rehabilitation Program and will admit today.  Preadmission Screen Completed By: Shann Medal with updates by Cleatrice Burke, 07/13/2021 11:20 AM ______________________________________________________________________   Discussed status with Dr. Ranell Patrick on 07/13/2021 at 1118 and received approval for admission today.  Admission Coordinator: Shann Medal with updates by Cleatrice Burke, RN, time 9326 Date 07/13/2021   Assessment/Plan: Diagnosis: Polytrauma with right tibia/fibular fracture Does the need for close, 24 hr/day Medical supervision in concert with the patient's rehab needs make it unreasonable for this patient to be served in a less intensive setting? Yes Co-Morbidities requiring supervision/potential complications: left patella fracture, bimalleolar right ankle fracture, orthostatic hypotension, dyspnea on exertion, allergic urticaria Due to bladder management, bowel management, safety, skin/wound care, disease management, medication administration, pain management, and patient education, does the patient require 24 hr/day rehab nursing? Yes  Does the patient require coordinated care of a physician, rehab nurse, PT, OT to address physical and functional deficits in the context of the above medical diagnosis(es)? Yes Addressing deficits in the following areas: balance, endurance, locomotion, strength, transferring, bowel/bladder control, bathing, dressing, feeding, grooming, toileting, and psychosocial support Can the patient actively participate in an intensive therapy program of at least 3 hrs of therapy 5 days a week? Yes The potential for patient to make measurable gains while on inpatient rehab is excellent Anticipated functional outcomes upon discharge from inpatient rehab: min  assist PT, min assist OT, independent SLP Estimated rehab length of stay to reach the above functional goals is: 2 weeks Anticipated discharge destination: Home 10. Overall Rehab/Functional Prognosis: good   MD Signature: Leeroy Cha, MD

## 2021-07-10 NOTE — Progress Notes (Signed)
  Echocardiogram 2D Echocardiogram has been performed.  Courtney Berg 07/10/2021, 3:35 PM

## 2021-07-10 NOTE — Progress Notes (Signed)
Inpatient Rehab Admissions Coordinator:   Received approval from Arbie Cookey [facility] Insurance/worker's comp) for [facility] [employer]  I do not have a bed for this patient today but will follow for potential admission pending bed availability.   Shann Medal, PT, DPT Admissions Coordinator [phone] 07/10/21  3:16 PM

## 2021-07-10 NOTE — TOC Progression Note (Signed)
Transition of Care [facility]) - Progression Note    Patient Details  Name: Courtney Berg MRN: [id number] Date of Birth: 28-Sep-1956  Transition of Care [facility]) CM/SW Contact  Sharin Mons, RN Phone Number: 07/10/2021, 12:34 PM  Clinical Narrative:    NCM left voice message yesterday and this am with Micheal Likens [phone]), workman compensation rep. to learn determination regarding IR approval..Marland KitchenMarland KitchenAwaiting response. TOC team will continue to monitor and assist with needs.  Expected Discharge Plan: IP Rehab Facility Barriers to Discharge: Continued Medical Work up  Expected Discharge Plan and Services Expected Discharge Plan: [place]         Expected Discharge Date: 07/06/21                   Social Determinants of Health (SDOH) Interventions    Readmission Risk Interventions No flowsheet data found.

## 2021-07-10 NOTE — Progress Notes (Addendum)
Progress Note    Courtney Berg  [id number] DOB: November 27, 1955  DOA: 06/26/2021 PCP: Susy Frizzle, MD      Brief Narrative:    Medical records reviewed and are as summarized below:  Courtney Berg is a 65 y.o. female with medical history significant for environmental allergies, who presented to the hospital after a fall on the stairs at work.  Work-up revealed right bimalleolar ankle fracture dislocation and left patella fracture.  She underwent ORIF of right bimalleolar ankle fracture and ORIF of left patella on 07/01/2021.  She developed postoperative left knee cellulitis that was treated with antibiotics.  Hospital course was complicated by syncope and orthostatic hypotension.    Assessment/Plan:   Principal Problem:   Tibia/fibula fracture, right, closed, initial encounter Active Problems:   Allergies   Left patella fracture   Ankle dislocation, right, initial encounter   Closed fracture of right fibula and tibia   Elective surgery   Fall   Orthostatic hypotension    Body mass index is 30.41 kg/m.  (Obesity)    Right bimalleolar ankle fracture dislocation: S/p ORIF of right bimalleolar ankle fracture on 07/01/2021.  Analgesics as needed for pain.  Left patella fracture, left knee cellulitis: S/p ORIF of left patella fracture on 07/01/2021.  Continue Bactrim and analgesics.  Follow-up with orthopedic surgeon.  S/p syncope (on 8/17) from orthostatic hypotension: Cortisol level was 9.1.  ACTH stimulation test is normal and there is no evidence of adrenal insufficiency.  Midodrine has been increased from 5 mg to 10 mg.  Orthostatic vital signs are better today. 2D echo has been ordered per patient's request  Acute urinary retention: In and out urethral catheterization as needed.  Prediabetes, hyperglycemia: Hemoglobin A1c 5.8.  Close outpatient follow-up with PCP recommended.  Vitamin B12 deficiency and vitamin D deficiency: B12 level was 133 vitamin D  level was 19.211.  Continue vitamin B12 and vitamin D supplements.    Iron deficiency anemia: Iron level is 21, ferritin is 170 and percentage iron saturation is 8.  Constipation: Improved.  Continue laxatives as needed.  Hyponatremia: resolved   Diet Order             Diet - low sodium heart healthy           Diet regular Room service appropriate? Yes; Fluid consistency: Thin  Diet effective now                      Consultants: Orthopedic surgeon  Procedures: S/p ORIF right bimalleolar ankle fracture dislocation and s/p ORIF left patella knee fracture on 07/01/2021    Medications:    cholecalciferol  2,000 Units Oral Daily   iron polysaccharides  150 mg Oral Daily   midodrine  10 mg Oral TID WC   polyethylene glycol  17 g Oral BID   senna-docusate  1 tablet Oral BID   sulfamethoxazole-trimethoprim  1 tablet Oral Q12H   vitamin B-12  1,000 mcg Oral Daily   Continuous Infusions:  methocarbamol (ROBAXIN) IV       Anti-infectives (From admission, onward)    Start     Dose/Rate Route Frequency Ordered Stop   07/09/21 2200  sulfamethoxazole-trimethoprim (BACTRIM DS) 800-160 MG per tablet 1 tablet        1 tablet Oral Every 12 hours 07/08/21 1309 07/14/21 2159   07/08/21 2300  cefTRIAXone (ROCEPHIN) 2 g in sodium chloride 0.9 % 100 mL IVPB  2 g 200 mL/hr over 30 Minutes Intravenous Every 24 hours 07/08/21 1309 07/08/21 2144   07/07/21 2300  cefTRIAXone (ROCEPHIN) 2 g in sodium chloride 0.9 % 100 mL IVPB  Status:  Discontinued        2 g 200 mL/hr over 30 Minutes Intravenous Every 24 hours 07/07/21 2207 07/08/21 1309   07/01/21 1615  ceFAZolin (ANCEF) IVPB 2g/100 mL premix        2 g 200 mL/hr over 30 Minutes Intravenous Every 8 hours 07/01/21 1518 07/02/21 0637   07/01/21 1305  vancomycin (VANCOCIN) powder  Status:  Discontinued          As needed 07/01/21 1317 07/01/21 1346   07/01/21 0800  ceFAZolin (ANCEF) IVPB 2g/100 mL premix  Status:   Discontinued        2 g 200 mL/hr over 30 Minutes Intravenous On call to O.R. 07/01/21 0701 07/01/21 1515              Family Communication/Anticipated D/C date and plan/Code Status   DVT prophylaxis: SCDs Start: 07/01/21 1518     Code Status: Full Code  Family Communication: Sister and husband at bedside Disposition Plan:    Status is: Inpatient  Remains inpatient appropriate because:IV treatments appropriate due to intensity of illness or inability to take PO and Inpatient level of care appropriate due to severity of illness  Dispo: The patient is from: Home              Anticipated d/c is to: CIR              Patient currently is not medically stable to d/c.   Difficult to place patient No           Subjective:   Interval events noted.  She still has pain in the left knee and right ankle.  Objective:    Vitals:   07/09/21 1646 07/09/21 2128 07/10/21 0816 07/10/21 1332  BP: (!) 126/53 (!) 129/52 (!) 118/47 (!) 130/58  Pulse: 79 82 76 81  Resp: '15 18 17 16  '$ Temp: 98.1 F (36.7 C) 99.5 F (37.5 C) 98.5 F (36.9 C) 98.7 F (37.1 C)  TempSrc: Oral Oral Oral Oral  SpO2: 100% 100% 95% 99%  Weight:      Height:       Orthostatic vital signs: Supine BP 125/51, sitting BP 145/66, standing BP 154/75       No intake or output data in the 24 hours ending 07/10/21 1558  Filed Weights   07/01/21 0700 07/01/21 0933  Weight: 93.4 kg 90.7 kg    Exam:  GEN: NAD SKIN: Warm and dry EYES: No pallor or icterus ENT: MMM CV: RRR PULM: CTA B ABD: soft, obese, NT, +BS CNS: AAO x 3, non focal EXT: Swelling, tenderness and erythema over the left knee.  No drainage from left knee incisional wound sutures are intact.  Tenderness and swelling about the right ankle.  Right ankle incision and sutures are intact.            Data Reviewed:   I have personally reviewed following labs and imaging studies:  Labs: Labs show the following:   Basic  Metabolic Panel: Recent Labs  Lab 07/04/21 0250 07/09/21 0332  NA  --  137  K  --  3.6  CL  --  102  CO2  --  26  GLUCOSE  --  109*  BUN  --  10  CREATININE  --  0.73  CALCIUM  --  8.7*  MG 1.8  --    GFR Estimated Creatinine Clearance: 84.8 mL/min (by C-G formula based on SCr of 0.73 mg/dL). Liver Function Tests: No results for input(s): AST, ALT, ALKPHOS, BILITOT, PROT, ALBUMIN in the last 168 hours.  No results for input(s): LIPASE, AMYLASE in the last 168 hours. No results for input(s): AMMONIA in the last 168 hours. Coagulation profile No results for input(s): INR, PROTIME in the last 168 hours.  CBC: Recent Labs  Lab 07/04/21 0250 07/07/21 2251 07/09/21 0332  WBC 8.8 10.6* 9.5  NEUTROABS 5.2 7.1 6.6  HGB 9.5* 10.7* 9.7*  HCT 29.6* 32.0* 28.9*  MCV 95.5 93.3 92.9  PLT 335 384 312   Cardiac Enzymes: No results for input(s): CKTOTAL, CKMB, CKMBINDEX, TROPONINI in the last 168 hours. BNP (last 3 results) No results for input(s): PROBNP in the last 8760 hours. CBG: No results for input(s): GLUCAP in the last 168 hours. D-Dimer: No results for input(s): DDIMER in the last 72 hours. Hgb A1c: No results for input(s): HGBA1C in the last 72 hours. Lipid Profile: No results for input(s): CHOL, HDL, LDLCALC, TRIG, CHOLHDL, LDLDIRECT in the last 72 hours. Thyroid function studies: No results for input(s): TSH, T4TOTAL, T3FREE, THYROIDAB in the last 72 hours.  Invalid input(s): FREET3 Anemia work up: No results for input(s): VITAMINB12, FOLATE, FERRITIN, TIBC, IRON, RETICCTPCT in the last 72 hours. Sepsis Labs: Recent Labs  Lab 07/04/21 0250 07/07/21 2251 07/09/21 0332  WBC 8.8 10.6* 9.5    Microbiology Recent Results (from the past 240 hour(s))  Culture, blood (routine x 2)     Status: None (Preliminary result)   Collection Time: 07/07/21 10:51 PM   Specimen: BLOOD  Result Value Ref Range Status   Specimen Description BLOOD RIGHT ANTECUBITAL  Final    Special Requests   Final    BOTTLES DRAWN AEROBIC AND ANAEROBIC Blood Culture adequate volume   Culture   Final    NO GROWTH 3 DAYS Performed at [facility], [address], [place], [place] [address]    Report Status PENDING  Incomplete  Culture, blood (routine x 2)     Status: None (Preliminary result)   Collection Time: 07/07/21 10:51 PM   Specimen: BLOOD RIGHT HAND  Result Value Ref Range Status   Specimen Description BLOOD RIGHT HAND  Final   Special Requests   Final    BOTTLES DRAWN AEROBIC AND ANAEROBIC Blood Culture adequate volume   Culture   Final    NO GROWTH 3 DAYS Performed at [facility], [place] [address], [place], [place] [address]    Report Status PENDING  Incomplete    Procedures and diagnostic studies:  ECHOCARDIOGRAM COMPLETE  Result Date: 07/10/2021    ECHOCARDIOGRAM REPORT   Patient Name:   Courtney Berg Date of Exam: 07/10/2021 Medical Rec #:  [id number]       Height:       68.0 in Accession #:    [id number]      Weight:       200.0 lb Date of Birth:  04-14-1956      BSA:          2.044 m Patient Age:    7 years        BP:           130/58 mmHg Patient Gender: F  HR:           78 bpm. Exam Location:  Inpatient Procedure: 2D Echo, Cardiac Doppler and Color Doppler Indications:    Syncope  History:        Patient has prior history of Echocardiogram examinations.  Sonographer:    NA Referring Phys: [id number] Courtney Berg IMPRESSIONS  1. Left ventricular ejection fraction, by estimation, is 60 to 65%. The left ventricle has normal function. The left ventricle has no regional wall motion abnormalities. There is mild left ventricular hypertrophy. Left ventricular diastolic parameters are consistent with Grade I diastolic dysfunction (impaired relaxation).  2. Right ventricular systolic function is mildly reduced. The right ventricular size is normal. Tricuspid regurgitation signal is inadequate for assessing PA pressure.  3. The mitral valve is normal  in structure. Trivial mitral valve regurgitation. No evidence of mitral stenosis.  4. The aortic valve is tricuspid. Aortic valve regurgitation is not visualized. No aortic stenosis is present.  5. Aortic dilatation noted. There is borderline dilatation of the ascending aorta, measuring 37 mm.  6. The inferior vena cava is dilated in size with >50% respiratory variability, suggesting right atrial pressure of 8 mmHg. FINDINGS  Left Ventricle: Left ventricular ejection fraction, by estimation, is 60 to 65%. The left ventricle has normal function. The left ventricle has no regional wall motion abnormalities. The left ventricular internal cavity size was normal in size. There is  mild left ventricular hypertrophy. Left ventricular diastolic parameters are consistent with Grade I diastolic dysfunction (impaired relaxation). Right Ventricle: The right ventricular size is normal. No increase in right ventricular wall thickness. Right ventricular systolic function is mildly reduced. Tricuspid regurgitation signal is inadequate for assessing PA pressure. Left Atrium: Left atrial size was normal in size. Right Atrium: Right atrial size was normal in size. Pericardium: There is no evidence of pericardial effusion. Mitral Valve: The mitral valve is normal in structure. There is mild calcification of the mitral valve leaflet(s). Trivial mitral valve regurgitation. No evidence of mitral valve stenosis. Tricuspid Valve: The tricuspid valve is normal in structure. Tricuspid valve regurgitation is not demonstrated. Aortic Valve: The aortic valve is tricuspid. Aortic valve regurgitation is not visualized. No aortic stenosis is present. Pulmonic Valve: The pulmonic valve was normal in structure. Pulmonic valve regurgitation is not visualized. Aorta: Aortic dilatation noted. There is borderline dilatation of the ascending aorta, measuring 37 mm. Venous: The inferior vena cava is dilated in size with greater than 50% respiratory  variability, suggesting right atrial pressure of 8 mmHg. IAS/Shunts: No atrial level shunt detected by color flow Doppler.  LEFT VENTRICLE PLAX 2D LVIDd:         3.80 cm     Diastology LVIDs:         2.60 cm     LV e' medial:    9.57 cm/s LV PW:         1.20 cm     LV E/e' medial:  6.9 LV IVS:        1.50 cm     LV e' lateral:   15.30 cm/s LVOT diam:     2.00 cm     LV E/e' lateral: 4.3 LV SV:         61 LV SV Index:   30 LVOT Area:     3.14 cm  LV Volumes (MOD) LV vol d, MOD A2C: 77.9 ml LV vol d, MOD A4C: 94.4 ml LV vol s, MOD A2C: 30.6 ml LV vol s, MOD A4C:  27.8 ml LV SV MOD A2C:     47.3 ml LV SV MOD A4C:     94.4 ml LV SV MOD BP:      55.9 ml RIGHT VENTRICLE            IVC RV S prime:     8.59 cm/s  IVC diam: 2.20 cm TAPSE (M-mode): 2.0 cm LEFT ATRIUM             Index       RIGHT ATRIUM           Index LA diam:        2.20 cm 1.08 cm/m  RA Area:     15.20 cm LA Vol (A2C):   33.6 ml 16.44 ml/m RA Volume:   36.60 ml  17.91 ml/m LA Vol (A4C):   22.1 ml 10.81 ml/m LA Biplane Vol: 29.3 ml 14.34 ml/m  AORTIC VALVE             PULMONIC VALVE LVOT Vmax:   101.00 cm/s PR End Diast Vel: 1.31 msec LVOT Vmean:  66.600 cm/s LVOT VTI:    0.194 m  AORTA Ao Root diam: 3.60 cm Ao Asc diam:  3.70 cm MITRAL VALVE MV Area (PHT): 3.12 cm    SHUNTS MV Decel Time: 243 msec    Systemic VTI:  0.19 m MV E velocity: 66.00 cm/s  Systemic Diam: 2.00 cm MV A velocity: 85.40 cm/s MV E/A ratio:  0.77 Courtney McleanMD Electronically signed by Courtney Berg Signature Date/Time: 07/10/2021/3:34:24 PM    Final                LOS: 13 days   Courtney Berg  [facility]   Pager on [email or web address]. If 7PM-7AM, please contact night-coverage at [email or web address]     07/10/2021, 3:58 PM

## 2021-07-10 NOTE — Progress Notes (Signed)
Orthopaedic Trauma Progress Note  SUBJECTIVE: Received testing for cortisol levels this AM. Wants to try to get up today. Eager to get home. Sister and husband aat bedside. Frustrated that therapy did not come yesterday. Asking about CIR vs home health PT. Does not feel comfortable to go home with orthostatic hypotension.    OBJECTIVE:  Vitals:   07/09/21 2128 07/10/21 0816  BP: (!) 129/52 (!) 118/47  Pulse: 82 76  Resp: 18 17  Temp: 99.5 F (37.5 C)   SpO2: 100% 95%    General: Sitting up in bed, no acute distress Respiratory: No increased work of breathing.  LLE: Hinge brace in place.  Incision with some continued redness to the area-improved from previous.  No drainage. Notable bruising and swelling posterior knee and lower leg. Able to wiggle toes.  Ankle dorsiflexion/plantarflexion intact.  Endorses sensation to light touch throughout the extremity.  Compartments soft and compressible. + DP pulse  RLE: Cam boot not currently in place.  Incisions clean, dry, intact with no drainage. Bruising about the ankle as expected. Ankle DF/PF intact but stiff. Able to wiggle toes. Endorses sensation to light touch over all aspects of foot. Foot is warm and well-perfused. +DP pulse  IMAGING: Stable post op imaging.   LABS:  Results for orders placed or performed during the hospital encounter of 06/26/21 (from the past 24 hour(s))  ACTH stimulation, 3 time points     Status: None   Collection Time: 07/10/21  7:10 AM  Result Value Ref Range   Cortisol, Base 21.5 ug/dL   Cortisol, 30 Min 25.2 ug/dL   Cortisol, 60 Min 28.6 ug/dL    ASSESSMENT: Courtney Berg is a 65 y.o. female, 9 Days Post-Op s/p Procedure(s): ORIF RIGHT ANKLE FRACTURE ORIF LEFT PATELLA  CV/Blood loss: Stable.  PLAN: Weightbearing: NWB RLE, WBAT LLE with hinge brace locked in full extension ROM: Ok for R ankle ROM as tolerated at rest Incisional and dressing care:  Ok to leave open to air Showering: Ok to shower with  assistance. Incisions may get wet Orthopedic device(s): CAM boot RLE when OOB, Bledsoe brace LLE Pain management:  1. Tylenol 325-650 mg q 6 hours PRN 2. Robaxin 500 mg q 6 hours PRN 3. Norco 5-325 mg q 4 hours PRN moderate pain OR Norco 7.5-325 mg q 4 hours PRN severe pain  4. Dilaudid 0.5-1 mg q 4 hours PRN VTE prophylaxis: Lovenox, SCDs ID:  Ancef 2gm post op completed. Started on Ceftriaxone 2 gm q 24hrs for cellulitis starting 07/07/21.  Switch to Bactrim DS 07/09/21 Foley/Lines:  No foley, KVO IVFs Impediments to Fracture Healing: Vit D level 19, continue on D3 supplementation. Recommend continuing this at discharge  Dispo: Therapies as tolerated, PT/OT recommending CIR. Patient initially denied. TOC involved and working on this-potentially would be better option with new orthostatic diagnosis - Continue to monitor orthostatic vitals. - Continue Bactrim DS BID x 5 days. - Ortho team will continue to evaluate patient daily for improvements in L knee cellulitis.   Family Communication: Discussed plan today with patient's sister Courtney Berg and husband at bedside  Follow - up plan:  Will continue to follow inpatient and will obtain x-rays and possible suture removal next week if still in house or in CIR 2 weeks after d/c for repeat x-rays and wound check  Contact information:  Katha Hamming MD, Patrecia Pace PA-C. After hours and holidays please check [email or web address] for group call information for [facility]   Courtney Berg.  Courtney Carruthers, MD [facility] [phone] (office) [email or web address]

## 2021-07-10 NOTE — Progress Notes (Deleted)
Patient has been NPO since midnight, ERAS ordered but pre-surgery drink( Gatorade for diabetic patients)is  not available for the past weeks, I even went to other units to check, Patient is Q4 CBG check, will continue to monitor. (Check flow sheet pre- surgery list for other documentation), thanks!

## 2021-07-11 LAB — CBC
HCT: 28.9 % — ABNORMAL LOW (ref 36.0–46.0)
Hemoglobin: 9.4 g/dL — ABNORMAL LOW (ref 12.0–15.0)
MCH: 30.4 pg (ref 26.0–34.0)
MCHC: 32.5 g/dL (ref 30.0–36.0)
MCV: 93.5 fL (ref 80.0–100.0)
Platelets: 333 10*3/uL (ref 150–400)
RBC: 3.09 MIL/uL — ABNORMAL LOW (ref 3.87–5.11)
RDW: 13.2 % (ref 11.5–15.5)
WBC: 10.2 10*3/uL (ref 4.0–10.5)
nRBC: 0 % (ref 0.0–0.2)

## 2021-07-11 NOTE — Progress Notes (Signed)
Physical Therapy Treatment Patient Details Name: Courtney Berg MRN: [id number] DOB: 09/03/1956 Today's Date: 07/11/2021    History of Present Illness The pt is a 65 year old female who presented to the ED 8/5 after fall downstairs at work, left knee x-ray showed patella fracture, right ankle and foot x-ray showed distal tibia and fibula fracture and dislocated ankle. Pt now s/p ORIF of R ankle and L patella 8/10. PMH includes diabetes, hypertension, cancer.    PT Comments    Pt received seated EOB with RN present, agreeable to therapy session and with good participation in transfer training and instruction on wheelchair mobility/safety. Pt continues to need bed height significantly elevated due to LLE extension and RLE NWB to initiate standing transfers and able to perform with +2 modA along with pivot to wheelchair with good compliance with NWB on RLE. Pt with difficulty offloading LLE for pivotal hops due to WB/movement restrictions so instead performed heel to toe ankle shuffle/pivot on LLE to chair. Pt mildly orthostatic (c/o feeling "hot" but not dizzy),  BP 128/82 standing and  BP 151/66 seated in chair. Pt performed wheelchair mobility and wanted to remain out in hallway with spouse to look out the window at end of session, RN aware and given instructions on safely assisting her back to bed. Pt continues to benefit from PT services to progress toward functional mobility goals.   Follow Up Recommendations  CIR     Equipment Recommendations  Wheelchair (measurements PT);Wheelchair cushion (measurements PT)    Recommendations for Other Services       Precautions / Restrictions Precautions Precautions: Fall Precaution Comments: orthostatic hypotension Required Braces or Orthoses: Other Brace (L knee bledsoe locked in extension.) Knee Immobilizer - Left: On at all times Other Brace: CAM walker boot RLE-when OOB- can be off in bed. Restrictions Weight Bearing Restrictions:  Yes RLE Weight Bearing: Non weight bearing LLE Weight Bearing: Weight bearing as tolerated Other Position/Activity Restrictions: pt is allowed gentle ROM to R ankle out of CAM boot when at rest with leg supported    Mobility  Bed Mobility    General bed mobility comments: pt received seated EOB, RN present and had assisted her up.    Transfers Overall transfer level: Needs assistance Equipment used: Rolling walker (2 wheeled) Transfers: Sit to/from [facility] Sit to Stand: +2 physical assistance;From elevated surface;Mod assist Stand pivot transfers: +2 physical assistance;+2 safety/equipment;Mod assist;From elevated surface       General transfer comment: Sit to stand from EOB with modA +2 to assist in anterior weight shift of body onto L leg locked in extension and to keep R foot off ground. BP taken in stance. Stand pivot to wheelchair, modA to pivot and chair brought close behind her due to inability to hop on extended LLE.  Ambulation/Gait             General Gait Details: Unable to take steps today.   Stairs             Information systems manager mobility: Yes Wheelchair propulsion: Both upper extremities Wheelchair parts: Supervision/cueing Distance: 150 Wheelchair Assistance Details (indicate cue type and reason): Cues for locking brakes and cues for use of wheel rails to drive wheels to move forward, backward and for turns. Cues for activity pacing and energy conservation, good carryover.  Modified Rankin (Stroke Patients Only)       Balance Overall balance assessment: Needs assistance;History of Falls Sitting-balance support: Feet supported;Bilateral upper extremity supported;Single extremity supported  Sitting balance-Leahy Scale: Poor Sitting balance - Comments: L leg extended, pt leaning and propping back on hands for support.   Standing balance support: Bilateral upper extremity supported;During functional  activity Standing balance-Leahy Scale: Poor Standing balance comment: Reliant on bil UE support of RW and modA +2 for standing.                            Cognition Arousal/Alertness: Awake/alert Behavior During Therapy: WFL for tasks assessed/performed Overall Cognitive Status: Within Functional Limits for tasks assessed                                 General Comments: Pt follows cues and problem-solves appropriately.      Exercises      General Comments General comments (skin integrity, edema, etc.): BP 121/53 supine; BP 147/61 seated; BP standing 128/82; BP 151/66 seated post-pivot to chair (hot in stance but not dizzy; only able to tolerate standing ~90 sec due to pain/fatigue      Pertinent Vitals/Pain Pain Assessment: Faces Faces Pain Scale: Hurts even more Pain Location: L knee and R ankle Pain Descriptors / Indicators: Discomfort;Sore;Grimacing;Guarding Pain Intervention(s): Limited activity within patient's tolerance;Monitored during session;Premedicated before session;Repositioned    Home Living                      Prior Function            PT Goals (current goals can now be found in the care plan section) Acute Rehab PT Goals Patient Stated Goal: return to independence PT Goal Formulation: With patient/family Time For Goal Achievement: 07/16/21 Potential to Achieve Goals: Good Progress towards PT goals: Progressing toward goals    Frequency    Min 5X/week      PT Plan Current plan remains appropriate    Co-evaluation              AM-PAC PT "6 Clicks" Mobility   Outcome Measure  Help needed turning from your back to your side while in a flat bed without using bedrails?: A Little Help needed moving from lying on your back to sitting on the side of a flat bed without using bedrails?: A Little Help needed moving to and from a bed to a chair (including a wheelchair)?: A Lot Help needed standing up from a chair  using your arms (e.g., wheelchair or bedside chair)?: A Lot Help needed to walk in hospital room?: Total Help needed climbing 3-5 steps with a railing? : Total 6 Click Score: 12    End of Session Equipment Utilized During Treatment: Gait belt Activity Tolerance: Patient tolerated treatment well Patient left: with family/visitor present;in chair (pt up in wheelchair with spouse present outside of unit) Nurse Communication: Mobility status;Other (comment) (orthostatics) PT Visit Diagnosis: Other abnormalities of gait and mobility (R26.89);Muscle weakness (generalized) (M62.81);Pain;Unsteadiness on feet (R26.81);Difficulty in walking, not elsewhere classified (R26.2) Pain - Right/Left: Left Pain - part of body: Knee     Time: 1203-1221 PT Time Calculation (min) (ACUTE ONLY): 18 min  Charges:  $Therapeutic Activity: 8-22 mins                     Stana Bayon P., PTA Acute Rehabilitation Services Pager: [phone] Office: [place] 07/11/2021, 1:00 PM

## 2021-07-11 NOTE — Progress Notes (Signed)
[facility] Progress Note  Patient ID: Courtney Berg MRN: [id number] DOB/AGE: 1956/10/24 65 y.o.  Subjective:  Doing better Still having some issues with orthostatics  States she has been approved for CIR, just waiting for a bed   Voiding w/o difficulty   ROS As above  Objective:   VITALS:   Vitals:   07/11/21 1159 07/11/21 1205 07/11/21 1210 07/11/21 1214  BP: (!) 121/53 (!) 147/61 128/82 (!) 151/66  Pulse: 73 87 (!) 135 83  Resp: 15     Temp: 99 F (37.2 C)     TempSrc: Oral     SpO2: 98% 99% 93% 100%  Weight:      Height:        Estimated body mass index is 30.41 kg/m as calculated from the following:   Height as of this encounter: '5\' 8"'$  (1.727 m).   Weight as of this encounter: 90.7 kg.   Intake/Output      08/19 0701 08/20 0700 08/20 0701 08/21 0700   P.O. 240    Total Intake(mL/kg) 240 (2.6)    Urine (mL/kg/hr)     Stool     Total Output     Net +240         Urine Occurrence 5 x 1 x   Stool Occurrence 4 x 1 x     LABS  Results for orders placed or performed during the hospital encounter of 06/26/21 (from the past 24 hour(s))  CBC     Status: Abnormal   Collection Time: 07/11/21 12:36 AM  Result Value Ref Range   WBC 10.2 4.0 - 10.5 K/uL   RBC 3.09 (L) 3.87 - 5.11 MIL/uL   Hemoglobin 9.4 (L) 12.0 - 15.0 g/dL   HCT 28.9 (L) 36.0 - 46.0 %   MCV 93.5 80.0 - 100.0 fL   MCH 30.4 26.0 - 34.0 pg   MCHC 32.5 30.0 - 36.0 g/dL   RDW 13.2 11.5 - 15.5 %   Platelets 333 150 - 400 K/uL   nRBC 0.0 0.0 - 0.2 %     PHYSICAL EXAM:   Gen: sitting up in bed, appears well, NAD, husband at bedside  Ext:   Right Lower Extremity    Boot off    Moderate swelling to R ankle    Suture lines look good    Blister to dorsum of R foot    Stage 1 pressure wound lateral distal foot    Distal motor and sensory functions intact   Can get ankle to neutral    Ecchymosis  expected and stable to right ankle. She does have ecchymosis to her lower leg as well    + DP pulse   Good perfusion distally    Left Lower Extremity    Hinged brace fitting well   Erythema L knee much improved   Suture line stable    No drainage    Swelling and ecchymosis are stable   Compartments soft and compressible. No increased pain with palpation    + DP pulse    Ext warm and perfused   Assessment/Plan: 10 Days Post-Op     Anti-infectives (From admission, onward)    Start     Dose/Rate Route Frequency Ordered Stop   07/09/21 2200  sulfamethoxazole-trimethoprim (BACTRIM DS) 800-160 MG per  tablet 1 tablet        1 tablet Oral Every 12 hours 07/08/21 1309 07/14/21 2159   07/08/21 2300  cefTRIAXone (ROCEPHIN) 2 g in sodium chloride 0.9 % 100 mL IVPB        2 g 200 mL/hr over 30 Minutes Intravenous Every 24 hours 07/08/21 1309 07/08/21 2144   07/07/21 2300  cefTRIAXone (ROCEPHIN) 2 g in sodium chloride 0.9 % 100 mL IVPB  Status:  Discontinued        2 g 200 mL/hr over 30 Minutes Intravenous Every 24 hours 07/07/21 2207 07/08/21 1309   07/01/21 1615  ceFAZolin (ANCEF) IVPB 2g/100 mL premix        2 g 200 mL/hr over 30 Minutes Intravenous Every 8 hours 07/01/21 1518 07/02/21 0637   07/01/21 1305  vancomycin (VANCOCIN) powder  Status:  Discontinued          As needed 07/01/21 1317 07/01/21 1346   07/01/21 0800  ceFAZolin (ANCEF) IVPB 2g/100 mL premix  Status:  Discontinued        2 g 200 mL/hr over 30 Minutes Intravenous On call to O.R. 07/01/21 0701 07/01/21 1515     .  POD/HD#: 10  65 year old female fall with right ankle fracture and left patella fracture  -Fall  -Right ankle fracture s/p ORIF   NWB R LEx  Encourage ankle flexion extension as well as heel cord stretching  We will get her a PRAFO to protect her soft tissue a bit better.  Adaptic, Kerlix and Ace wrap applied foot and ankle for swelling control and soft tissue management  PT and OT   Patient  encouraged to come out of her PRAFO boot frequently to work on heel cord stretching and ankle motion  -Left patella fracture s/p ORIF   Weight-bear as tolerated left leg with hinged brace locked in full extension  Okay to shower and clean wound with soap and water only  TED hose if needed for swelling control  -Cellulitis left knee  Much improved  Continue oral antibiotics  Continue to monitor   Local hygiene with soap and water  - Pain management:  Multimodal   Tylenol, Robaxin, Norco   DC IV medication  - ABL anemia/Hemodynamics  Stable - Medical issues   Per primary team - DVT/PE prophylaxis:  Lovenox and SCDs - ID:   On Bactrim DS, started on 07/09/2021---> 5-day course - Metabolic Bone Disease:  Vitamin D deficiency  Supplementation has been started  - Activity:  As above    - Impediments to fracture healing:  Deficiency  - Dispo:  Continue with therapies  Await bed at CIR.  Patient stable from [place], PA-C [phone] (C) 07/11/2021, 12:52 PM  [facility] [place] [address] [phone] Jenetta [phone] (F)    After 5pm and on the weekends please log on to Amion, go to orthopaedics and the look under the [facility] Call for the provider(s) on call. You can also call our office at [phone] and then follow the prompts to be connected to the call team.

## 2021-07-11 NOTE — Progress Notes (Addendum)
Progress Note    Courtney Berg  [id number] DOB: Apr 25, 1956  DOA: 06/26/2021 PCP: Susy Frizzle, MD      Brief Narrative:    Medical records reviewed and are as summarized below:  Courtney Berg is a 65 y.o. female with medical history significant for environmental allergies, who presented to the hospital after a fall on the stairs at work.  Work-up revealed right bimalleolar ankle fracture dislocation and left patella fracture.  She underwent ORIF of right bimalleolar ankle fracture and ORIF of left patella on 07/01/2021.  She developed postoperative left knee cellulitis that was treated with antibiotics.  Hospital course was complicated by syncope and orthostatic hypotension.    Assessment/Plan:   Principal Problem:   Tibia/fibula fracture, right, closed, initial encounter Active Problems:   Allergies   Left patella fracture   Ankle dislocation, right, initial encounter   Closed fracture of right fibula and tibia   Elective surgery   Fall   Orthostatic hypotension    Body mass index is 30.41 kg/m.  (Obesity)    Right bimalleolar ankle fracture dislocation: S/p ORIF of right bimalleolar ankle fracture on 07/01/2021.  Analgesics as needed for pain.  Left patella fracture, left knee cellulitis: S/p ORIF of left patella fracture on 07/01/2021.  Continue Bactrim and analgesics.  S/p syncope (on 8/17) from orthostatic hypotension: No evidence of adrenal insufficiency.  Continue midodrine. 2D echo showed EF estimated at 60 to 123456, grade 1 diastolic dysfunction.  Acute urinary retention: In and out urethral catheterization as needed.  Prediabetes, hyperglycemia: Hemoglobin A1c 5.8.  Close outpatient follow-up with PCP recommended.  Vitamin B12 deficiency and vitamin D deficiency: B12 level was 133 vitamin D level was 19.211.  Continue vitamin B12 and vitamin D supplements.    Iron deficiency anemia: Iron level is 21, ferritin is 170 and percentage iron  saturation is 8.  Continue iron supplement  Constipation: Improved.  Continue laxatives as needed.  Hyponatremia: resolved  Awaiting placement to inpatient rehab   Diet Order             Diet - low sodium heart healthy           Diet regular Room service appropriate? Yes; Fluid consistency: Thin  Diet effective now                      Consultants: Orthopedic surgeon  Procedures: S/p ORIF right bimalleolar ankle fracture dislocation and s/p ORIF left patella knee fracture on 07/01/2021    Medications:    cholecalciferol  2,000 Units Oral Daily   iron polysaccharides  150 mg Oral Daily   midodrine  10 mg Oral TID WC   polyethylene glycol  17 g Oral BID   senna-docusate  1 tablet Oral BID   sulfamethoxazole-trimethoprim  1 tablet Oral Q12H   vitamin B-12  1,000 mcg Oral Daily   Continuous Infusions:  methocarbamol (ROBAXIN) IV       Anti-infectives (From admission, onward)    Start     Dose/Rate Route Frequency Ordered Stop   07/09/21 2200  sulfamethoxazole-trimethoprim (BACTRIM DS) 800-160 MG per tablet 1 tablet        1 tablet Oral Every 12 hours 07/08/21 1309 07/14/21 2159   07/08/21 2300  cefTRIAXone (ROCEPHIN) 2 g in sodium chloride 0.9 % 100 mL IVPB        2 g 200 mL/hr over 30 Minutes Intravenous Every 24 hours 07/08/21 1309 07/08/21 2144  07/07/21 2300  cefTRIAXone (ROCEPHIN) 2 g in sodium chloride 0.9 % 100 mL IVPB  Status:  Discontinued        2 g 200 mL/hr over 30 Minutes Intravenous Every 24 hours 07/07/21 2207 07/08/21 1309   07/01/21 1615  ceFAZolin (ANCEF) IVPB 2g/100 mL premix        2 g 200 mL/hr over 30 Minutes Intravenous Every 8 hours 07/01/21 1518 07/02/21 0637   07/01/21 1305  vancomycin (VANCOCIN) powder  Status:  Discontinued          As needed 07/01/21 1317 07/01/21 1346   07/01/21 0800  ceFAZolin (ANCEF) IVPB 2g/100 mL premix  Status:  Discontinued        2 g 200 mL/hr over 30 Minutes Intravenous On call to O.R. 07/01/21 0701  07/01/21 1515              Family Communication/Anticipated D/C date and plan/Code Status   DVT prophylaxis: SCDs Start: 07/01/21 1518     Code Status: Full Code  Family Communication: None Disposition Plan:    Status is: Inpatient  Remains inpatient appropriate because:IV treatments appropriate due to intensity of illness or inability to take PO and Inpatient level of care appropriate due to severity of illness  Dispo: The patient is from: Home              Anticipated d/c is to: CIR              Patient currently is medically stable to d/c.   Difficult to place patient No           Subjective:   Interval events noted.  She feels a little better today.  Pain in the left knee is better.  She thinks redness over the left knee is slightly better as well.  Objective:    Vitals:   07/10/21 0816 07/10/21 1332 07/10/21 1930 07/11/21 0814  BP: (!) 118/47 (!) 130/58 (!) 122/54 (!) 117/53  Pulse: 76 81 85 83  Resp: '17 16  16  '$ Temp: 98.5 F (36.9 C) 98.7 F (37.1 C) 98.3 F (36.8 C) 98.8 F (37.1 C)  TempSrc: Oral Oral Oral Oral  SpO2: 95% 99% 94% 98%  Weight:      Height:       Orthostatic vital signs: Supine BP 125/51, sitting BP 145/66, standing BP 154/75        Intake/Output Summary (Last 24 hours) at 07/11/2021 1137 Last data filed at 07/11/2021 0100 Gross per 24 hour  Intake 240 ml  Output --  Net 240 ml    Filed Weights   07/01/21 0700 07/01/21 0933  Weight: 93.4 kg 90.7 kg    Exam:  GEN: NAD SKIN: Warm and dry EYES: No pallor or icterus ENT: MMM CV: RRR PULM: CTA B ABD: soft, ND, NT, +BS CNS: AAO x 3, non focal EXT: Swelling, tenderness and erythema over the left knee.  Swelling and tenderness around the right ankle.  Left knee incisional wound and right knee incisional wounds are intact with sutures in place.          Data Reviewed:   I have personally reviewed following labs and imaging studies:  Labs: Labs show the  following:   Basic Metabolic Panel: Recent Labs  Lab 07/09/21 0332  NA 137  K 3.6  CL 102  CO2 26  GLUCOSE 109*  BUN 10  CREATININE 0.73  CALCIUM 8.7*   GFR Estimated Creatinine Clearance: 84.8 mL/min (by C-G  formula based on SCr of 0.73 mg/dL). Liver Function Tests: No results for input(s): AST, ALT, ALKPHOS, BILITOT, PROT, ALBUMIN in the last 168 hours.  No results for input(s): LIPASE, AMYLASE in the last 168 hours. No results for input(s): AMMONIA in the last 168 hours. Coagulation profile No results for input(s): INR, PROTIME in the last 168 hours.  CBC: Recent Labs  Lab 07/07/21 2251 07/09/21 0332 07/11/21 0036  WBC 10.6* 9.5 10.2  NEUTROABS 7.1 6.6  --   HGB 10.7* 9.7* 9.4*  HCT 32.0* 28.9* 28.9*  MCV 93.3 92.9 93.5  PLT 384 312 333   Cardiac Enzymes: No results for input(s): CKTOTAL, CKMB, CKMBINDEX, TROPONINI in the last 168 hours. BNP (last 3 results) No results for input(s): PROBNP in the last 8760 hours. CBG: No results for input(s): GLUCAP in the last 168 hours. D-Dimer: No results for input(s): DDIMER in the last 72 hours. Hgb A1c: No results for input(s): HGBA1C in the last 72 hours. Lipid Profile: No results for input(s): CHOL, HDL, LDLCALC, TRIG, CHOLHDL, LDLDIRECT in the last 72 hours. Thyroid function studies: No results for input(s): TSH, T4TOTAL, T3FREE, THYROIDAB in the last 72 hours.  Invalid input(s): FREET3 Anemia work up: No results for input(s): VITAMINB12, FOLATE, FERRITIN, TIBC, IRON, RETICCTPCT in the last 72 hours. Sepsis Labs: Recent Labs  Lab 07/07/21 2251 07/09/21 0332 07/11/21 0036  WBC 10.6* 9.5 10.2    Microbiology Recent Results (from the past 240 hour(s))  Culture, blood (routine x 2)     Status: None (Preliminary result)   Collection Time: 07/07/21 10:51 PM   Specimen: BLOOD  Result Value Ref Range Status   Specimen Description BLOOD RIGHT ANTECUBITAL  Final   Special Requests   Final    BOTTLES DRAWN  AEROBIC AND ANAEROBIC Blood Culture adequate volume   Culture   Final    NO GROWTH 4 DAYS Performed at [facility], [address], [place], [place] [address]    Report Status PENDING  Incomplete  Culture, blood (routine x 2)     Status: None (Preliminary result)   Collection Time: 07/07/21 10:51 PM   Specimen: BLOOD RIGHT HAND  Result Value Ref Range Status   Specimen Description BLOOD RIGHT HAND  Final   Special Requests   Final    BOTTLES DRAWN AEROBIC AND ANAEROBIC Blood Culture adequate volume   Culture   Final    NO GROWTH 4 DAYS Performed at [facility], [place] [address], [place], [place] [address]    Report Status PENDING  Incomplete    Procedures and diagnostic studies:  ECHOCARDIOGRAM COMPLETE  Result Date: 07/10/2021    ECHOCARDIOGRAM REPORT   Patient Name:   Courtney Berg Date of Exam: 07/10/2021 Medical Rec #:  [id number]       Height:       68.0 in Accession #:    [id number]      Weight:       200.0 lb Date of Birth:  March 17, 1956      BSA:          2.044 m Patient Age:    42 years        BP:           130/58 mmHg Patient Gender: F               HR:           78 bpm. Exam Location:  Inpatient Procedure: 2D Echo, Cardiac  Doppler and Color Doppler Indications:    Syncope  History:        Patient has prior history of Echocardiogram examinations.  Sonographer:    NA Referring Phys: [id number] Johnryan Sao IMPRESSIONS  1. Left ventricular ejection fraction, by estimation, is 60 to 65%. The left ventricle has normal function. The left ventricle has no regional wall motion abnormalities. There is mild left ventricular hypertrophy. Left ventricular diastolic parameters are consistent with Grade I diastolic dysfunction (impaired relaxation).  2. Right ventricular systolic function is mildly reduced. The right ventricular size is normal. Tricuspid regurgitation signal is inadequate for assessing PA pressure.  3. The mitral valve is normal in structure. Trivial mitral valve  regurgitation. No evidence of mitral stenosis.  4. The aortic valve is tricuspid. Aortic valve regurgitation is not visualized. No aortic stenosis is present.  5. Aortic dilatation noted. There is borderline dilatation of the ascending aorta, measuring 37 mm.  6. The inferior vena cava is dilated in size with >50% respiratory variability, suggesting right atrial pressure of 8 mmHg. FINDINGS  Left Ventricle: Left ventricular ejection fraction, by estimation, is 60 to 65%. The left ventricle has normal function. The left ventricle has no regional wall motion abnormalities. The left ventricular internal cavity size was normal in size. There is  mild left ventricular hypertrophy. Left ventricular diastolic parameters are consistent with Grade I diastolic dysfunction (impaired relaxation). Right Ventricle: The right ventricular size is normal. No increase in right ventricular wall thickness. Right ventricular systolic function is mildly reduced. Tricuspid regurgitation signal is inadequate for assessing PA pressure. Left Atrium: Left atrial size was normal in size. Right Atrium: Right atrial size was normal in size. Pericardium: There is no evidence of pericardial effusion. Mitral Valve: The mitral valve is normal in structure. There is mild calcification of the mitral valve leaflet(s). Trivial mitral valve regurgitation. No evidence of mitral valve stenosis. Tricuspid Valve: The tricuspid valve is normal in structure. Tricuspid valve regurgitation is not demonstrated. Aortic Valve: The aortic valve is tricuspid. Aortic valve regurgitation is not visualized. No aortic stenosis is present. Pulmonic Valve: The pulmonic valve was normal in structure. Pulmonic valve regurgitation is not visualized. Aorta: Aortic dilatation noted. There is borderline dilatation of the ascending aorta, measuring 37 mm. Venous: The inferior vena cava is dilated in size with greater than 50% respiratory variability, suggesting right atrial  pressure of 8 mmHg. IAS/Shunts: No atrial level shunt detected by color flow Doppler.  LEFT VENTRICLE PLAX 2D LVIDd:         3.80 cm     Diastology LVIDs:         2.60 cm     LV e' medial:    9.57 cm/s LV PW:         1.20 cm     LV E/e' medial:  6.9 LV IVS:        1.50 cm     LV e' lateral:   15.30 cm/s LVOT diam:     2.00 cm     LV E/e' lateral: 4.3 LV SV:         61 LV SV Index:   30 LVOT Area:     3.14 cm  LV Volumes (MOD) LV vol d, MOD A2C: 77.9 ml LV vol d, MOD A4C: 94.4 ml LV vol s, MOD A2C: 30.6 ml LV vol s, MOD A4C: 27.8 ml LV SV MOD A2C:     47.3 ml LV SV MOD A4C:     94.4  ml LV SV MOD BP:      55.9 ml RIGHT VENTRICLE            IVC RV S prime:     8.59 cm/s  IVC diam: 2.20 cm TAPSE (M-mode): 2.0 cm LEFT ATRIUM             Index       RIGHT ATRIUM           Index LA diam:        2.20 cm 1.08 cm/m  RA Area:     15.20 cm LA Vol (A2C):   33.6 ml 16.44 ml/m RA Volume:   36.60 ml  17.91 ml/m LA Vol (A4C):   22.1 ml 10.81 ml/m LA Biplane Vol: 29.3 ml 14.34 ml/m  AORTIC VALVE             PULMONIC VALVE LVOT Vmax:   101.00 cm/s PR End Diast Vel: 1.31 msec LVOT Vmean:  66.600 cm/s LVOT VTI:    0.194 m  AORTA Ao Root diam: 3.60 cm Ao Asc diam:  3.70 cm MITRAL VALVE MV Area (PHT): 3.12 cm    SHUNTS MV Decel Time: 243 msec    Systemic VTI:  0.19 m MV E velocity: 66.00 cm/s  Systemic Diam: 2.00 cm MV A velocity: 85.40 cm/s MV E/A ratio:  0.77 Dalton McleanMD Electronically signed by Franki Monte Signature Date/Time: 07/10/2021/3:34:24 PM    Final                LOS: 14 days   Romy Mcgue  [facility]   Pager on [email or web address]. If 7PM-7AM, please contact night-coverage at [email or web address]     07/11/2021, 11:37 AM

## 2021-07-12 LAB — CULTURE, BLOOD (ROUTINE X 2)
Culture: NO GROWTH
Culture: NO GROWTH
Special Requests: ADEQUATE
Special Requests: ADEQUATE

## 2021-07-12 NOTE — Progress Notes (Signed)
Progress Note    Courtney Berg  [id number] DOB: January 04, 1956  DOA: 06/26/2021 PCP: Susy Frizzle, MD      Brief Narrative:    Medical records reviewed and are as summarized below:  Courtney Berg is a 65 y.o. female with medical history significant for environmental allergies, who presented to the hospital after a fall on the stairs at work.  Work-up revealed right bimalleolar ankle fracture dislocation and left patella fracture.  She underwent ORIF of right bimalleolar ankle fracture and ORIF of left patella on 07/01/2021.  She developed postoperative left knee cellulitis that was treated with antibiotics.  Hospital course was complicated by syncope and orthostatic hypotension.    Assessment/Plan:   Principal Problem:   Tibia/fibula fracture, right, closed, initial encounter Active Problems:   Allergies   Left patella fracture   Ankle dislocation, right, initial encounter   Closed fracture of right fibula and tibia   Elective surgery   Fall   Orthostatic hypotension    Body mass index is 30.41 kg/m.  (Obesity)    Right bimalleolar ankle fracture dislocation: S/p ORIF of right bimalleolar ankle fracture on 07/01/2021.  Analgesics as needed for pain.  Left patella fracture, left knee cellulitis: S/p ORIF of left patella fracture on 07/01/2021.  Continue Bactrim and analgesics.  S/p syncope (on 8/17) from orthostatic hypotension: No evidence of adrenal insufficiency.  Orthostatic hypotension was improved.  Continue midodrine. 2D echo showed EF estimated at 60 to 123456, grade 1 diastolic dysfunction.  Acute urinary retention: In and out urethral catheterization as needed.  Prediabetes, hyperglycemia: Hemoglobin A1c 5.8.  Close outpatient follow-up with PCP recommended.  Vitamin B12 deficiency and vitamin D deficiency: B12 level was 133 vitamin D level was 19.211.  Continue vitamin B12 and vitamin D supplements.    Iron deficiency anemia: Iron level is 21,  ferritin is 170 and percentage iron saturation is 8.  Continue iron supplement  Constipation: Improved.  Continue laxatives as needed.  Hyponatremia: resolved  Awaiting placement to CIR.  (Inpatient rehab)   Diet Order             Diet - low sodium heart healthy           Diet regular Room service appropriate? Yes; Fluid consistency: Thin  Diet effective now                      Consultants: Orthopedic surgeon  Procedures: S/p ORIF right bimalleolar ankle fracture dislocation and s/p ORIF left patella knee fracture on 07/01/2021    Medications:    cholecalciferol  2,000 Units Oral Daily   iron polysaccharides  150 mg Oral Daily   midodrine  10 mg Oral TID WC   polyethylene glycol  17 g Oral BID   senna-docusate  1 tablet Oral BID   sulfamethoxazole-trimethoprim  1 tablet Oral Q12H   vitamin B-12  1,000 mcg Oral Daily   Continuous Infusions:  methocarbamol (ROBAXIN) IV       Anti-infectives (From admission, onward)    Start     Dose/Rate Route Frequency Ordered Stop   07/09/21 2200  sulfamethoxazole-trimethoprim (BACTRIM DS) 800-160 MG per tablet 1 tablet        1 tablet Oral Every 12 hours 07/08/21 1309 07/14/21 2159   07/08/21 2300  cefTRIAXone (ROCEPHIN) 2 g in sodium chloride 0.9 % 100 mL IVPB        2 g 200 mL/hr over 30 Minutes Intravenous Every  24 hours 07/08/21 1309 07/08/21 2144   07/07/21 2300  cefTRIAXone (ROCEPHIN) 2 g in sodium chloride 0.9 % 100 mL IVPB  Status:  Discontinued        2 g 200 mL/hr over 30 Minutes Intravenous Every 24 hours 07/07/21 2207 07/08/21 1309   07/01/21 1615  ceFAZolin (ANCEF) IVPB 2g/100 mL premix        2 g 200 mL/hr over 30 Minutes Intravenous Every 8 hours 07/01/21 1518 07/02/21 0637   07/01/21 1305  vancomycin (VANCOCIN) powder  Status:  Discontinued          As needed 07/01/21 1317 07/01/21 1346   07/01/21 0800  ceFAZolin (ANCEF) IVPB 2g/100 mL premix  Status:  Discontinued        2 g 200 mL/hr over 30  Minutes Intravenous On call to O.R. 07/01/21 0701 07/01/21 1515              Family Communication/Anticipated D/C date and plan/Code Status   DVT prophylaxis: SCDs Start: 07/01/21 1518     Code Status: Full Code  Family Communication: None Disposition Plan:    Status is: Inpatient  Remains inpatient appropriate because:IV treatments appropriate due to intensity of illness or inability to take PO and Inpatient level of care appropriate due to severity of illness  Dispo: The patient is from: Home              Anticipated d/c is to: CIR              Patient currently is medically stable to d/c.   Difficult to place patient No           Subjective:   Interval events noted.  No new complaints.  Left knee pain is better.  Objective:    Vitals:   07/11/21 1806 07/11/21 2116 07/12/21 0900 07/12/21 1430  BP: (!) 107/50 (!) 117/53 (!) 114/57 126/62  Pulse:  68 71 66  Resp:  17    Temp:  98.6 F (37 C) 99.1 F (37.3 C) 98.8 F (37.1 C)  TempSrc:  Oral    SpO2:  99% 95% 97%  Weight:      Height:       Orthostatic vital signs: Supine BP 125/51, sitting BP 145/66, standing BP 154/75        Intake/Output Summary (Last 24 hours) at 07/12/2021 1523 Last data filed at 07/12/2021 1100 Gross per 24 hour  Intake 240 ml  Output 1 ml  Net 239 ml    Filed Weights   07/01/21 0700 07/01/21 0933  Weight: 93.4 kg 90.7 kg    Exam:  GEN: NAD SKIN: Warm and dry EYES: EOMI ENT: MMM CV: RRR PULM: CTA B ABD: soft, ND, NT, +BS CNS: AAO x 3, non focal EXT: Swelling, erythema and tenderness of the left knee are improving.  Mild swelling noted tenderness around the right ankle.  Left knee incisional wound on right incisional wounds are intact with sutures in place.             Data Reviewed:   I have personally reviewed following labs and imaging studies:  Labs: Labs show the following:   Basic Metabolic Panel: Recent Labs  Lab 07/09/21 0332  NA  137  K 3.6  CL 102  CO2 26  GLUCOSE 109*  BUN 10  CREATININE 0.73  CALCIUM 8.7*   GFR Estimated Creatinine Clearance: 84.8 mL/min (by C-G formula based on SCr of 0.73 mg/dL). Liver Function Tests: No  results for input(s): AST, ALT, ALKPHOS, BILITOT, PROT, ALBUMIN in the last 168 hours.  No results for input(s): LIPASE, AMYLASE in the last 168 hours. No results for input(s): AMMONIA in the last 168 hours. Coagulation profile No results for input(s): INR, PROTIME in the last 168 hours.  CBC: Recent Labs  Lab 07/07/21 2251 07/09/21 0332 07/11/21 0036  WBC 10.6* 9.5 10.2  NEUTROABS 7.1 6.6  --   HGB 10.7* 9.7* 9.4*  HCT 32.0* 28.9* 28.9*  MCV 93.3 92.9 93.5  PLT 384 312 333   Cardiac Enzymes: No results for input(s): CKTOTAL, CKMB, CKMBINDEX, TROPONINI in the last 168 hours. BNP (last 3 results) No results for input(s): PROBNP in the last 8760 hours. CBG: No results for input(s): GLUCAP in the last 168 hours. D-Dimer: No results for input(s): DDIMER in the last 72 hours. Hgb A1c: No results for input(s): HGBA1C in the last 72 hours. Lipid Profile: No results for input(s): CHOL, HDL, LDLCALC, TRIG, CHOLHDL, LDLDIRECT in the last 72 hours. Thyroid function studies: No results for input(s): TSH, T4TOTAL, T3FREE, THYROIDAB in the last 72 hours.  Invalid input(s): FREET3 Anemia work up: No results for input(s): VITAMINB12, FOLATE, FERRITIN, TIBC, IRON, RETICCTPCT in the last 72 hours. Sepsis Labs: Recent Labs  Lab 07/07/21 2251 07/09/21 0332 07/11/21 0036  WBC 10.6* 9.5 10.2    Microbiology Recent Results (from the past 240 hour(s))  Culture, blood (routine x 2)     Status: None   Collection Time: 07/07/21 10:51 PM   Specimen: BLOOD  Result Value Ref Range Status   Specimen Description BLOOD RIGHT ANTECUBITAL  Final   Special Requests   Final    BOTTLES DRAWN AEROBIC AND ANAEROBIC Blood Culture adequate volume   Culture   Final    NO GROWTH 5  DAYS Performed at [facility], [address] [place], [place] [address]    Report Status 07/12/2021 FINAL  Final  Culture, blood (routine x 2)     Status: None   Collection Time: 07/07/21 10:51 PM   Specimen: BLOOD RIGHT HAND  Result Value Ref Range Status   Specimen Description BLOOD RIGHT HAND  Final   Special Requests   Final    BOTTLES DRAWN AEROBIC AND ANAEROBIC Blood Culture adequate volume   Culture   Final    NO GROWTH 5 DAYS Performed at [facility], [place] [address], [place], [place] [address]    Report Status 07/12/2021 FINAL  Final    Procedures and diagnostic studies:  ECHOCARDIOGRAM COMPLETE  Result Date: 07/10/2021    ECHOCARDIOGRAM REPORT   Patient Name:   Courtney Berg Date of Exam: 07/10/2021 Medical Rec #:  [id number]       Height:       68.0 in Accession #:    [id number]      Weight:       200.0 lb Date of Birth:  [birth year]      BSA:          2.044 m Patient Age:    66 years        BP:           130/58 mmHg Patient Gender: F               HR:           78 bpm. Exam Location:  Inpatient Procedure: 2D Echo, Cardiac Doppler and Color Doppler Indications:    Syncope  History:  Patient has prior history of Echocardiogram examinations.  Sonographer:    NA Referring Phys: [id number] Kenyetta Fife IMPRESSIONS  1. Left ventricular ejection fraction, by estimation, is 60 to 65%. The left ventricle has normal function. The left ventricle has no regional wall motion abnormalities. There is mild left ventricular hypertrophy. Left ventricular diastolic parameters are consistent with Grade I diastolic dysfunction (impaired relaxation).  2. Right ventricular systolic function is mildly reduced. The right ventricular size is normal. Tricuspid regurgitation signal is inadequate for assessing PA pressure.  3. The mitral valve is normal in structure. Trivial mitral valve regurgitation. No evidence of mitral stenosis.  4. The aortic valve is tricuspid. Aortic valve  regurgitation is not visualized. No aortic stenosis is present.  5. Aortic dilatation noted. There is borderline dilatation of the ascending aorta, measuring 37 mm.  6. The inferior vena cava is dilated in size with >50% respiratory variability, suggesting right atrial pressure of 8 mmHg. FINDINGS  Left Ventricle: Left ventricular ejection fraction, by estimation, is 60 to 65%. The left ventricle has normal function. The left ventricle has no regional wall motion abnormalities. The left ventricular internal cavity size was normal in size. There is  mild left ventricular hypertrophy. Left ventricular diastolic parameters are consistent with Grade I diastolic dysfunction (impaired relaxation). Right Ventricle: The right ventricular size is normal. No increase in right ventricular wall thickness. Right ventricular systolic function is mildly reduced. Tricuspid regurgitation signal is inadequate for assessing PA pressure. Left Atrium: Left atrial size was normal in size. Right Atrium: Right atrial size was normal in size. Pericardium: There is no evidence of pericardial effusion. Mitral Valve: The mitral valve is normal in structure. There is mild calcification of the mitral valve leaflet(s). Trivial mitral valve regurgitation. No evidence of mitral valve stenosis. Tricuspid Valve: The tricuspid valve is normal in structure. Tricuspid valve regurgitation is not demonstrated. Aortic Valve: The aortic valve is tricuspid. Aortic valve regurgitation is not visualized. No aortic stenosis is present. Pulmonic Valve: The pulmonic valve was normal in structure. Pulmonic valve regurgitation is not visualized. Aorta: Aortic dilatation noted. There is borderline dilatation of the ascending aorta, measuring 37 mm. Venous: The inferior vena cava is dilated in size with greater than 50% respiratory variability, suggesting right atrial pressure of 8 mmHg. IAS/Shunts: No atrial level shunt detected by color flow Doppler.  LEFT  VENTRICLE PLAX 2D LVIDd:         3.80 cm     Diastology LVIDs:         2.60 cm     LV e' medial:    9.57 cm/s LV PW:         1.20 cm     LV E/e' medial:  6.9 LV IVS:        1.50 cm     LV e' lateral:   15.30 cm/s LVOT diam:     2.00 cm     LV E/e' lateral: 4.3 LV SV:         61 LV SV Index:   30 LVOT Area:     3.14 cm  LV Volumes (MOD) LV vol d, MOD A2C: 77.9 ml LV vol d, MOD A4C: 94.4 ml LV vol s, MOD A2C: 30.6 ml LV vol s, MOD A4C: 27.8 ml LV SV MOD A2C:     47.3 ml LV SV MOD A4C:     94.4 ml LV SV MOD BP:      55.9 ml RIGHT VENTRICLE  IVC RV S prime:     8.59 cm/s  IVC diam: 2.20 cm TAPSE (M-mode): 2.0 cm LEFT ATRIUM             Index       RIGHT ATRIUM           Index LA diam:        2.20 cm 1.08 cm/m  RA Area:     15.20 cm LA Vol (A2C):   33.6 ml 16.44 ml/m RA Volume:   36.60 ml  17.91 ml/m LA Vol (A4C):   22.1 ml 10.81 ml/m LA Biplane Vol: 29.3 ml 14.34 ml/m  AORTIC VALVE             PULMONIC VALVE LVOT Vmax:   101.00 cm/s PR End Diast Vel: 1.31 msec LVOT Vmean:  66.600 cm/s LVOT VTI:    0.194 m  AORTA Ao Root diam: 3.60 cm Ao Asc diam:  3.70 cm MITRAL VALVE MV Area (PHT): 3.12 cm    SHUNTS MV Decel Time: 243 msec    Systemic VTI:  0.19 m MV E velocity: 66.00 cm/s  Systemic Diam: 2.00 cm MV A velocity: 85.40 cm/s MV E/A ratio:  0.77 Dalton McleanMD Electronically signed by Franki Monte Signature Date/Time: 07/10/2021/3:34:24 PM    Final                LOS: 15 days   Linas Stepter  [facility]   Pager on [email or web address]. If 7PM-7AM, please contact night-coverage at [email or web address]     07/12/2021, 3:23 PM

## 2021-07-13 ENCOUNTER — Inpatient Hospital Stay (HOSPITAL_COMMUNITY)
Admission: RE | Admit: 2021-07-13 | Discharge: 2021-07-23 | DRG: 560 | Disposition: A | Discharge status: Home Health | Payer: Worker's Compensation | Source: Intra-hospital | Attending: Physical Medicine & Rehabilitation | Admitting: Physical Medicine & Rehabilitation

## 2021-07-13 ENCOUNTER — Inpatient Hospital Stay (HOSPITAL_COMMUNITY): Payer: Worker's Compensation

## 2021-07-13 ENCOUNTER — Other Ambulatory Visit: Payer: Self-pay

## 2021-07-13 ENCOUNTER — Encounter (HOSPITAL_COMMUNITY): Payer: Self-pay | Admitting: Physical Medicine & Rehabilitation

## 2021-07-13 ENCOUNTER — Encounter (HOSPITAL_COMMUNITY): Payer: Self-pay | Admitting: Internal Medicine

## 2021-07-13 DIAGNOSIS — Z6830 Body mass index (BMI) 30.0-30.9, adult: Secondary | ICD-10-CM | POA: Diagnosis not present

## 2021-07-13 DIAGNOSIS — S82201A Unspecified fracture of shaft of right tibia, initial encounter for closed fracture: Secondary | ICD-10-CM | POA: Diagnosis not present

## 2021-07-13 DIAGNOSIS — D62 Acute posthemorrhagic anemia: Secondary | ICD-10-CM | POA: Diagnosis present

## 2021-07-13 DIAGNOSIS — S82002A Unspecified fracture of left patella, initial encounter for closed fracture: Secondary | ICD-10-CM | POA: Diagnosis present

## 2021-07-13 DIAGNOSIS — I82461 Acute embolism and thrombosis of right calf muscular vein: Secondary | ICD-10-CM | POA: Diagnosis present

## 2021-07-13 DIAGNOSIS — S82841A Displaced bimalleolar fracture of right lower leg, initial encounter for closed fracture: Secondary | ICD-10-CM

## 2021-07-13 DIAGNOSIS — Z833 Family history of diabetes mellitus: Secondary | ICD-10-CM

## 2021-07-13 DIAGNOSIS — E669 Obesity, unspecified: Secondary | ICD-10-CM | POA: Diagnosis present

## 2021-07-13 DIAGNOSIS — F411 Generalized anxiety disorder: Secondary | ICD-10-CM | POA: Diagnosis present

## 2021-07-13 DIAGNOSIS — L03115 Cellulitis of right lower limb: Secondary | ICD-10-CM | POA: Diagnosis present

## 2021-07-13 DIAGNOSIS — Z8249 Family history of ischemic heart disease and other diseases of the circulatory system: Secondary | ICD-10-CM

## 2021-07-13 DIAGNOSIS — I82432 Acute embolism and thrombosis of left popliteal vein: Secondary | ICD-10-CM | POA: Diagnosis present

## 2021-07-13 DIAGNOSIS — W109XXD Fall (on) (from) unspecified stairs and steps, subsequent encounter: Secondary | ICD-10-CM | POA: Diagnosis present

## 2021-07-13 DIAGNOSIS — I951 Orthostatic hypotension: Secondary | ICD-10-CM | POA: Diagnosis present

## 2021-07-13 DIAGNOSIS — I82411 Acute embolism and thrombosis of right femoral vein: Secondary | ICD-10-CM | POA: Diagnosis present

## 2021-07-13 DIAGNOSIS — K567 Ileus, unspecified: Secondary | ICD-10-CM | POA: Diagnosis present

## 2021-07-13 DIAGNOSIS — Z87891 Personal history of nicotine dependence: Secondary | ICD-10-CM

## 2021-07-13 DIAGNOSIS — S82841D Displaced bimalleolar fracture of right lower leg, subsequent encounter for closed fracture with routine healing: Principal | ICD-10-CM

## 2021-07-13 DIAGNOSIS — T148XXA Other injury of unspecified body region, initial encounter: Secondary | ICD-10-CM

## 2021-07-13 DIAGNOSIS — I82453 Acute embolism and thrombosis of peroneal vein, bilateral: Secondary | ICD-10-CM | POA: Diagnosis present

## 2021-07-13 DIAGNOSIS — I82A13 Acute embolism and thrombosis of axillary vein, bilateral: Secondary | ICD-10-CM

## 2021-07-13 DIAGNOSIS — S82002D Unspecified fracture of left patella, subsequent encounter for closed fracture with routine healing: Secondary | ICD-10-CM

## 2021-07-13 DIAGNOSIS — Z800 Family history of malignant neoplasm of digestive organs: Secondary | ICD-10-CM

## 2021-07-13 DIAGNOSIS — R197 Diarrhea, unspecified: Secondary | ICD-10-CM

## 2021-07-13 DIAGNOSIS — S82401A Unspecified fracture of shaft of right fibula, initial encounter for closed fracture: Secondary | ICD-10-CM | POA: Diagnosis not present

## 2021-07-13 DIAGNOSIS — I82403 Acute embolism and thrombosis of unspecified deep veins of lower extremity, bilateral: Secondary | ICD-10-CM

## 2021-07-13 DIAGNOSIS — S82002S Unspecified fracture of left patella, sequela: Secondary | ICD-10-CM | POA: Diagnosis not present

## 2021-07-13 DIAGNOSIS — Z09 Encounter for follow-up examination after completed treatment for conditions other than malignant neoplasm: Secondary | ICD-10-CM

## 2021-07-13 DIAGNOSIS — K219 Gastro-esophageal reflux disease without esophagitis: Secondary | ICD-10-CM | POA: Diagnosis not present

## 2021-07-13 DIAGNOSIS — S82891A Other fracture of right lower leg, initial encounter for closed fracture: Secondary | ICD-10-CM | POA: Diagnosis not present

## 2021-07-13 DIAGNOSIS — M7989 Other specified soft tissue disorders: Secondary | ICD-10-CM | POA: Diagnosis not present

## 2021-07-13 DIAGNOSIS — S82891S Other fracture of right lower leg, sequela: Secondary | ICD-10-CM | POA: Diagnosis not present

## 2021-07-13 LAB — COMPREHENSIVE METABOLIC PANEL
ALT: 12 U/L (ref 0–44)
AST: 18 U/L (ref 15–41)
Albumin: 3.4 g/dL — ABNORMAL LOW (ref 3.5–5.0)
Alkaline Phosphatase: 54 U/L (ref 38–126)
Anion gap: 10 (ref 5–15)
BUN: 11 mg/dL (ref 8–23)
CO2: 24 mmol/L (ref 22–32)
Calcium: 9.2 mg/dL (ref 8.9–10.3)
Chloride: 100 mmol/L (ref 98–111)
Creatinine, Ser: 0.89 mg/dL (ref 0.44–1.00)
GFR, Estimated: >60 mL/min (ref >60)
Glucose, Bld: 104 mg/dL — ABNORMAL HIGH (ref 70–99)
Potassium: 3.8 mmol/L (ref 3.5–5.1)
Sodium: 134 mmol/L — ABNORMAL LOW (ref 135–145)
Total Bilirubin: 0.6 mg/dL (ref 0.3–1.2)
Total Protein: 7.1 g/dL (ref 6.5–8.1)

## 2021-07-13 MED ORDER — MIDODRINE HCL 5 MG PO TABS
10.0000 mg | ORAL_TABLET | Freq: Three times a day (TID) | ORAL | Status: DC
  Administered 2021-07-13 – 2021-07-16 (×8): 10 mg via ORAL
  Filled 2021-07-13 (×8): qty 2

## 2021-07-13 MED ORDER — VITAMIN B-12 1000 MCG PO TABS
1000.0000 ug | ORAL_TABLET | Freq: Every day | ORAL | Status: DC
  Administered 2021-07-14 – 2021-07-23 (×10): 1000 ug via ORAL
  Filled 2021-07-13 (×10): qty 1

## 2021-07-13 MED ORDER — ENOXAPARIN SODIUM 40 MG/0.4ML IJ SOSY
40.0000 mg | PREFILLED_SYRINGE | INTRAMUSCULAR | Status: DC
  Administered 2021-07-13: 40 mg via SUBCUTANEOUS
  Filled 2021-07-13: qty 0.4

## 2021-07-13 MED ORDER — HYDROCODONE-ACETAMINOPHEN 7.5-325 MG PO TABS: 1.0000 | ORAL_TABLET | ORAL | Status: DC | PRN

## 2021-07-13 MED ORDER — HYDROCODONE-ACETAMINOPHEN 5-325 MG PO TABS: 1.0000 | ORAL_TABLET | ORAL | Status: DC | PRN

## 2021-07-13 MED ORDER — VITAMIN D 25 MCG (1000 UNIT) PO TABS
2000.0000 [IU] | ORAL_TABLET | Freq: Every day | ORAL | Status: DC
  Administered 2021-07-14 – 2021-07-23 (×10): 2000 [IU] via ORAL
  Filled 2021-07-13 (×10): qty 2

## 2021-07-13 MED ORDER — POLYSACCHARIDE IRON COMPLEX 150 MG PO CAPS
150.0000 mg | ORAL_CAPSULE | Freq: Every day | ORAL | Status: DC
  Administered 2021-07-14 – 2021-07-23 (×10): 150 mg via ORAL
  Filled 2021-07-13 (×10): qty 1

## 2021-07-13 MED ORDER — METHOCARBAMOL 500 MG PO TABS
500.0000 mg | ORAL_TABLET | Freq: Four times a day (QID) | ORAL | Status: DC | PRN
  Administered 2021-07-16 – 2021-07-20 (×2): 500 mg via ORAL
  Filled 2021-07-13 (×2): qty 1

## 2021-07-13 MED ORDER — SULFAMETHOXAZOLE-TRIMETHOPRIM 800-160 MG PO TABS
1.0000 | ORAL_TABLET | Freq: Two times a day (BID) | ORAL | Status: AC
  Administered 2021-07-13 – 2021-07-14 (×2): 1 via ORAL
  Filled 2021-07-13 (×2): qty 1

## 2021-07-13 MED ORDER — ACETAMINOPHEN 325 MG PO TABS
325.0000 mg | ORAL_TABLET | Freq: Four times a day (QID) | ORAL | Status: DC | PRN
  Administered 2021-07-14 – 2021-07-21 (×6): 650 mg via ORAL
  Filled 2021-07-13 (×6): qty 2

## 2021-07-13 MED ORDER — METHOCARBAMOL 1000 MG/10ML IJ SOLN
500.0000 mg | Freq: Four times a day (QID) | INTRAVENOUS | Status: DC | PRN
  Filled 2021-07-13: qty 5

## 2021-07-13 MED ORDER — POLYETHYLENE GLYCOL 3350 17 G PO PACK: 17.0000 g | PACK | Freq: Every day | ORAL | Status: DC | PRN

## 2021-07-13 MED ORDER — BISACODYL 10 MG RE SUPP: 10.0000 mg | Freq: Every day | RECTAL | Status: DC | PRN

## 2021-07-13 NOTE — Progress Notes (Signed)
Inpatient Rehabilitation Medication Review by a Pharmacist  A complete drug regimen review was completed for this patient to identify any potential clinically significant medication issues.  High Risk Drug Classes Is patient taking? Indication by Medication  Antipsychotic No   Anticoagulant Yes DVT ppx  Antibiotic Yes cellulitis  Opioid Yes Prn Vicodin  Antiplatelet No   Hypoglycemics/insulin No   Vasoactive Medication No   Chemotherapy No   Other No      Type of Medication Issue Identified Description of Issue Recommendation(s)  Drug Interaction(s) (clinically significant)     Duplicate Therapy     Allergy     No Medication Administration End Date     Incorrect Dose     Additional Drug Therapy Needed     Significant med changes from prior encounter (inform family/care partners about these prior to discharge).    Other       Clinically significant medication issues were identified that warrant physician communication and completion of prescribed/recommended actions by midnight of the next day:  No     Pharmacist comments: No other issues  Time spent performing this drug regimen review (minutes): 20 minutes   Tad Moore 07/13/2021 5:14 PM

## 2021-07-13 NOTE — Plan of Care (Signed)
  Problem: Activity: Goal: Ability to increase mobility will improve Outcome: Progressing   Problem: Physical Regulation: Goal: Postoperative complications will be avoided or minimized Outcome: Progressing   Problem: Pain Management: Goal: Pain level will decrease with appropriate interventions Outcome: Progressing

## 2021-07-13 NOTE — Progress Notes (Signed)
Inpatient Rehabilitation Admissions Coordinator   I have CIR bed available to admit patient today. I have alerted acute team and TOC and will make the arrangements to admit today. I met with patient and her spouse at bedside and they are in agreement.  Danne Baxter, RN, MSN Rehab Admissions Coordinator [phone] 07/13/2021 11:13 AM

## 2021-07-13 NOTE — H&P (Signed)
Physical Medicine and Rehabilitation Admission H&P    Chief Complaint  Patient presents with   Functional deficits secondary to     Ankle/patella Fx    HPI: Courtney Berg is a 65 year old female in relatively good health who was admitted on 06/26/2021 after tripping and falling off 3 stairs.  She sustained a right bimalleolar ankle fracture and left patella fracture which was splinted and trauma Ortho consulted for input.  She was taken to the OR for ORIF right bimalleolar ankle fracture with repair of ankle syndesmosis and ORIF left patella on 08/10 by Dr. Doreatha Martin.  Postop to be WBAT on LLE with knee locked in extension and NWB RLE with Lovenox for DVT prophylaxis.  Bledsoe brace to be worn at all times and CAM boot on RLE when [facility] course significant for issues with urinary retention requiring in and out catheterizations, hypotension, constipation, mild hyponatremia as well as stress-induced hyperglycemia.  Midodrine added for blood pressure support with improvement in symptoms.  She has had some issues with leukocytosis as well as redness surrounding patient --blood cultures x2 done 08/16 which were negative. She was started on septra by ortho for 5 day course to complete 08/23.  Pain is controlled and she continues to have diarrhea after meals. Therapy ongoing but patient limited by dizziness with orthostatic changes and weakness affecting ADLs and mobility . CIR recommended due to functional decline. Her husband is an Programme researcher, broadcasting/film/video and has already set up a lot of equipment for her at home.   Review of Systems  Constitutional:  Negative for chills and fever.  HENT:  Negative for hearing loss.   Eyes:  Negative for blurred vision and double vision.  Respiratory:  Negative for cough, shortness of breath and stridor.   Cardiovascular:  Negative for chest pain.  Gastrointestinal:  Positive for diarrhea (after meals/sensitive stomach). Negative for abdominal pain, heartburn,  nausea and vomiting.  Genitourinary:  Negative for dysuria and urgency.  Musculoskeletal:  Positive for joint pain (mild around "1"). Negative for myalgias and neck pain.  Skin:  Negative for rash.  Neurological:  Positive for dizziness (with positional changes/orthosatic changes) and weakness.  Psychiatric/Behavioral:  The patient has insomnia (needs quiet and dark/clock bothering her).     Past Medical History:  Diagnosis Date   Environmental allergies    pt still getting workup [id number]   Miscarriage     Past Surgical History:  Procedure Laterality Date   DILATION AND CURETTAGE OF UTERUS     post miscarriage   ORIF ANKLE FRACTURE Right 07/01/2021   Procedure: OPEN REDUCTION INTERNAL FIXATION (ORIF) ANKLE FRACTURE;  Surgeon: Shona Needles, MD;  Location: [place];  Service: Orthopedics;  Laterality: Right;   ORIF PATELLA Left 07/01/2021   Procedure: OPEN REDUCTION INTERNAL (ORIF) FIXATION PATELLA;  Surgeon: Shona Needles, MD;  Location[place];  Service: Orthopedics;  Laterality: Left;   TONSILLECTOMY     WISDOM TOOTH EXTRACTION      Family History  Problem Relation Age of Onset   Diabetes Mother    Hypertension Father    Cancer Father        stomach cancer @ 77. colon cancer @ 24   Colon cancer Father    Diabetes Brother    Cancer Sister        leukemia    Social History:  Married. 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 She reports that she quit smoking about  35 years ago. Her smoking use included cigarettes. She started smoking about 42 years ago. She has a 1.75 pack-year smoking history. She has never used smokeless tobacco. She reports that she does not drink alcohol and does not use drugs.   Allergies: No Known Allergies   Medications Prior to Admission  Medication Sig Dispense Refill   acetaminophen (TYLENOL) 325 MG tablet Take 650 mg by mouth every 6 (six) hours as needed for moderate pain or headache.     ibuprofen (ADVIL) 200 MG tablet Take  400 mg by mouth every 6 (six) hours as needed for headache or moderate pain.     COVID-19 mRNA vaccine, Pfizer, 30 MCG/0.3ML injection USE AS DIRECTED (Patient not taking: Reported on 06/26/2021) .3 mL 0    Drug Regimen Review  Drug regimen was reviewed and remains appropriate with no significant issues identified  Home: Home Living Family/patient expects to be discharged to:: Private residence Living Arrangements: Spouse/significant other Available Help at Discharge: Family, Available 24 hours/day Type of Home: House Home Access: Stairs to enter, Ramped entrance Technical brewer of Steps: 4 Entrance Stairs-Rails: Right, Left Home Layout: One level Bathroom Shower/Tub: Tub/shower unit, Architectural technologist: Standard Home Equipment: Bedside commode, Tub bench, Environmental consultant - 2 wheels, Wheelchair - manual, Grab bars - tub/shower, Crutches (WC with no foot rests)   Functional History: Prior Function Level of Independence: Independent Comments: Independent, working full time as Therapist, sports for [employer]. (can do from home), owns horses, still riding when able  Functional Status:  Mobility: Bed Mobility Overal bed mobility: Needs Assistance Bed Mobility: Supine to Sit Rolling: Min guard Supine to sit: HOB elevated, Supervision Sit to supine: Min assist General bed mobility comments: pt received seated EOB, RN present and had assisted her up. Transfers Overall transfer level: Needs assistance Equipment used: Rolling walker (2 wheeled) Transfers: Sit to/from Stand, Stand Pivot Transfers Sit to Stand: +2 physical assistance, From elevated surface, Mod assist Stand pivot transfers: +2 physical assistance, +2 safety/equipment, Mod assist, From elevated surface Anterior-Posterior transfers: Mod assist  Lateral/Scoot Transfers: Min assist General transfer comment: Sit to stand from EOB with modA +2 to assist in anterior weight shift of body onto L leg locked in extension and to  keep R foot off ground. BP taken in stance. Stand pivot to wheelchair, modA to pivot and chair brought close behind her due to inability to hop on extended LLE. Ambulation/Gait Ambulation/Gait assistance:  (unable due to soft BP and need to return to sitting.) Gait Distance (Feet): 4 Feet (gt limited due to dizziness.) Assistive device:  (knee walker) Gait Pattern/deviations: Step-to pattern General Gait Details: Unable to take steps today. Product manager mobility: Yes Wheelchair propulsion: Both upper extremities Wheelchair parts: Supervision/cueing Distance: 150 Wheelchair Assistance Details (indicate cue type and reason): Cues for locking brakes and cues for use of wheel rails to drive wheels to move forward, backward and for turns. Cues for activity pacing and energy conservation, good carryover.  ADL: ADL Overall ADL's : Needs assistance/impaired Eating/Feeding: Independent, Sitting Grooming: Set up, Sitting Upper Body Bathing: Set up, Sitting Lower Body Bathing: Minimal assistance, Sitting/lateral leans Lower Body Bathing Details (indicate cue type and reason): min A for rear peri area in sidelying Upper Body Dressing : Min guard, Sitting Lower Body Dressing: Maximal assistance, +2 for safety/equipment, +2 for physical assistance, Sitting/lateral leans, Sit to/from stand Toilet Transfer: Total assistance Toilet Transfer Details (indicate cue type and reason): Pt on bed pan upon arrival, otherwise max A for  posterior transfer from bed>BSC Toileting- Clothing Manipulation and Hygiene: Maximal assistance, +2 for physical assistance, +2 for safety/equipment Tub/ Shower Transfer: Maximal assistance, +2 for physical assistance, +2 for safety/equipment, Stand-pivot Functional mobility during ADLs:  (session focused on changing position and management of orthostatic hypotension) General ADL Comments: Pt declined all grooming and ADL needs while sitting  EOB  Cognition: Cognition Overall Cognitive Status: Within Functional Limits for tasks assessed Orientation Level: Oriented X4 Cognition Arousal/Alertness: Awake/alert Behavior During Therapy: WFL for tasks assessed/performed Overall Cognitive Status: Within Functional Limits for tasks assessed General Comments: Pt follows cues and problem-solves appropriately.   Blood pressure (!) 129/56, pulse 65, temperature 98.2 F (36.8 C), temperature source Oral, resp. rate 18, height '5\' 8"'$  (1.727 m), weight 90.7 kg, SpO2 98 %. Physical Exam Gen: no distress, normal appearing HEENT: oral mucosa pink and moist, NCAT Cardio: Reg rate Chest: normal effort, normal rate of breathing Abd: soft, non-distended Ext: no edema Psych: pleasant, normal affect Skin: see MSK Neuro: AOx3 Musculoskeletal:     Comments: Left knee incision C/D/I with sutures in place and mild erythema (decreased per ortho). Right ankle with flat blisters and lateral incision intact with sutures in place. Resolving ecchymosis medially.  Able to DF/PF right foot and flex right hip well, Left lower extremity mobility limited by brace  No results found for this or any previous visit (from the past 48 hour(s)). No results found.     Medical Problem List and Plan: 1.  Polytrauma with multiple fractures  -patient may shower but all incisions must be covered  -ELOS/Goals: 2 weeks MinA 2.  Impaired mobility:  -DVT/anticoagulation: Continue Lovenox.   -antiplatelet therapy: N/A 3. Post-operative pain: controlled without narcotics at this time. Continue Tylenol PRN. LFTs reviewed and stable.  4. Mood: LCSW to follow for evaluation and support.   -antipsychotic agents: N/A 5. Neuropsych: This patient is capable of making decisions on her own behalf. 6. Skin/Wound Care: Monitor wound for healing.  7. Fluids/Electrolytes/Nutrition: Monitor I/O. Check lytes in am.  8.  Right ankle bimalleolar Fx s/p ORIF:  NWB RLE--CAM boot on  when OOB.  9. Left patella Fx: WBAT with Timmothy Sours Joy brace locked in full extension.   --may remove brace in shower as long as knee kept in extension and brace replaced afterwards.   10. Acute blood loss anemia: Recheck CBC in am. Monitor for signs of bleeding.  11. Diarrhea: Has been refusing laxatives since 08/19-->change to prn. --Likely due to antibiotics as well as food sensitivities. Will add fiber for bulking.  12. Orthostatic hypotension: Monitor BP tid as well as orthostatic vitals--continues to have dizziness. Abdominal binder ordered -- Continue midodrine for BP support.  13.Cellulitis right knee/ankle?: Resolving with Septra DS--end date 08/23.  --Leucocytosis stable around 10 range.   --Ortho to follow up Wed for eval/suture removal.    I have personally performed a face to face diagnostic evaluation, including, but not limited to relevant history and physical exam findings, of this patient and developed relevant assessment and plan.  Additionally, I have reviewed and concur with the physician assistant's documentation above.  Leeroy Cha, MD  Bary Leriche, PA-C 07/13/2021

## 2021-07-13 NOTE — Progress Notes (Addendum)
Pt arrived to 4M12 per bed. Pt husband at bedside. Vitals obtained. Policies reviewed. Pt/family oriented to rehab. All questions answered. No further questions.Call light in reach, bed alarm on. Sheela Stack, LPN

## 2021-07-13 NOTE — Progress Notes (Signed)
CIR notified this RN that bed is now ready and report given to [employer].

## 2021-07-13 NOTE — Discharge Summary (Signed)
Physician Discharge Summary  Courtney Berg [id number] DOB: 1956/08/05 DOA: 06/26/2021  PCP: Susy Frizzle, MD  Admit date: 06/26/2021 Discharge date: 07/13/2021  Discharge disposition: Inpatient rehab center   Recommendations for Outpatient Follow-Up:   Outpatient follow-up with orthopedic surgeon as scheduled   Discharge Diagnosis:   Principal Problem:   Tibia/fibula fracture, right, closed, initial encounter Active Problems:   Allergies   Left patella fracture   Ankle dislocation, right, initial encounter   Closed fracture of right fibula and tibia   Elective surgery   Fall   Orthostatic hypotension   Ankle fracture, bimalleolar, closed, right, initial encounter    Discharge Condition: Stable.  Diet recommendation:  Diet Order             Diet - low sodium heart healthy           Diet regular Room service appropriate? Yes; Fluid consistency: Thin  Diet effective now                     Code Status: Full Code     Hospital Course:   Ms. Courtney Berg is a 65 y.o. female with medical history significant for environmental allergies, who presented to the hospital after a fall on the stairs at work.  Work-up revealed right bimalleolar ankle fracture dislocation and left patella fracture.  She underwent ORIF of right bimalleolar ankle fracture and ORIF of left patella on 07/01/2021.  She developed postoperative left knee cellulitis that was treated with antibiotics (ceftriaxone and Bactrim) plan to complete Bactrim on 07/13/2021.Marland Kitchen   Hospital course was complicated by syncope and orthostatic hypotension.  There was no evidence of adrenal insufficiency.  She was started on midodrine for orthostatic hypotension.  She is no longer orthostatic.  She was evaluated by PT and OT who recommended further rehabilitation at the inpatient rehab center.  Her condition has improved and she is deemed stable for discharge. Discharge plan was discussed with the patient and  her husband at the bedside.    Medical Consultants:   Orthopedic surgeon   Discharge Exam:    Vitals:   07/12/21 1430 07/12/21 2033 07/13/21 0428 07/13/21 0744  BP: 126/62 (!) 124/51 (!) 122/56 (!) 116/58  Pulse: 66 66 67 66  Resp:  '17 16 17  '$ Temp: 98.8 F (37.1 C) 98.9 F (37.2 C) 98.4 F (36.9 C) 98.2 F (36.8 C)  TempSrc:  Oral Oral Oral  SpO2: 97% 98% 97% 97%  Weight:      Height:         GEN: NAD SKIN: Warm and dry EYES: No pallor or icterus ENT: MMM CV: RRR PULM: CTA B ABD: soft, ND, NT, +BS CNS: AAO x 3, non focal EXT: Swelling, erythema and tenderness of the left knee are improving.  Tenderness around the right ankle.  Large incisional wound is intact with sutures in place   The results of significant diagnostics from this hospitalization (including imaging, microbiology, ancillary and laboratory) are listed below for reference.     Procedures and Diagnostic Studies:   DG Pelvis 1-2 Views  Result Date: 06/26/2021 CLINICAL DATA:  Fall EXAM: PELVIS - 1-2 VIEW COMPARISON:  None. FINDINGS: No fracture or malalignment. Pubic symphysis and rami appear intact. Mild degenerative changes of both hips. IMPRESSION: No acute osseous abnormality. Electronically Signed   By: Donavan Foil M.D.   On: 06/26/2021 20:44   DG Tibia/Fibula Right  Result Date: 06/26/2021 CLINICAL DATA:  Fall EXAM:  RIGHT TIBIA AND FIBULA - 2 VIEW COMPARISON:  None. FINDINGS: Proximal tibia and fibula appear intact. Acute comminuted and displaced distal fibular fracture. Acute displaced posterior malleolar fracture. Posterior dislocation of the talus IMPRESSION: Acute fracture dislocation at the ankle. Proximal tibia and fibula appear intact. Electronically Signed   By: Donavan Foil M.D.   On: 06/26/2021 20:49   DG Ankle 2 Views Right  Result Date: 06/26/2021 CLINICAL DATA:  Status post reduction and casting EXAM: RIGHT ANKLE - 2 VIEW COMPARISON:  Films from earlier in the same day. FINDINGS:  The fracture fragments have been reduced and the posterior talar dislocation has also been reduced. Splinting material is noted. IMPRESSION: Interval reduction of fractures and talar dislocation. Electronically Signed   By: Inez Catalina M.D.   On: 06/26/2021 22:35   DG Ankle 2 Views Right  Result Date: 06/26/2021 CLINICAL DATA:  Fall EXAM: RIGHT ANKLE - 2 VIEW COMPARISON:  None. FINDINGS: Acute comminuted fracture involving distal shaft of the fibula with slightly greater than 1 shaft diameter posterior displacement of distal fracture fragment. There is overriding. Acute displaced posterior malleolar fracture. Posterior dislocation of the talus with respect to the distal tibia. IMPRESSION: Acute comminuted distal tibial and fibular fractures with posterior dislocation of the talus with respect to the distal tibia Electronically Signed   By: Donavan Foil M.D.   On: 06/26/2021 20:46   CT ANKLE RIGHT WO CONTRAST  Result Date: 06/27/2021 CLINICAL DATA:  Complex ankle fractures. EXAM: CT OF THE RIGHT ANKLE WITHOUT CONTRAST TECHNIQUE: Multidetector CT imaging of the right ankle was performed according to the standard protocol. Multiplanar CT image reconstructions were also generated. COMPARISON:  Radiographs 06/26/2021 FINDINGS: Complex comminuted spiral type fracture involving the distal fibular shaft at and above the level of the ankle mortise. Maximum posterior displacement of 11 mm. Oblique coursing comminuted longitudinal intra-articular fracture involving the posterior malleolus of the tibia. Maximum posterior displacement is 7 mm. Maximum gap at the articular surface is 5 mm. The medial malleolus is intact. The medial mortise is slightly widened and a few tiny bony densities are noted near the talus. Possible deltoid ligament injury. There is a small impaction type fracture involving the medial and anterior aspect of the talar dome. The subtalar joints are maintained. The sinus tarsi is unremarkable. No  fracture of the calcaneus or midfoot bony structures. Mild midfoot degenerative changes are noted. Grossly by CT the medial and lateral and anterior ankle tendons are intact. The Achilles tendon is intact. There is a moderate-sized calcaneal heel spur noted. Reading remarkable running IMPRESSION: 1. Complex comminuted spiral type fracture involving the distal fibular shaft at and above the level of the ankle mortise. 2. Oblique coursing comminuted longitudinal intra-articular fracture involving the posterior malleolus of the tibia. Maximum posterior displacement is 7 mm. Maximum gap at the articular surface is 5 mm. 3. Small impaction type fracture involving the medial and anterior aspect of the talar dome. 4. Slight widening of the medial mortise and a few tiny bony densities near the talus. Possible deltoid ligament injury. Electronically Signed   By: Marijo Sanes M.D.   On: 06/27/2021 09:57   DG Chest Port 1 View  Result Date: 06/27/2021 CLINICAL DATA:  Respiratory failure, preoperative EXAM: PORTABLE CHEST 1 VIEW COMPARISON:  CT 01/24/2018 FINDINGS: Streaky opacities towards the lung bases favoring atelectatic change. No consolidative process or convincing features of edema. The cardiomediastinal contours are unremarkable. No acute osseous or soft tissue abnormality. Degenerative changes are present  in the imaged spine and shoulders. Telemetry leads overlie the chest. IMPRESSION: Streaky opacities towards the lung bases, favoring atelectasis. No other acute cardiopulmonary abnormality. Electronically Signed   By: Lovena Le M.D.   On: 06/27/2021 01:34   DG Knee Left Port  Result Date: 06/26/2021 CLINICAL DATA:  Recent fall with knee pain, initial encounter EXAM: PORTABLE LEFT KNEE - 1-2 VIEW COMPARISON:  None. FINDINGS: There are changes consistent with a transverse fracture through the patella. There is distraction of the fracture fragments of at least 3 cm. Fat fluid effusion is noted between the  fracture fragments. Additionally on the frontal film there is fragmentation of the more superior fragment identified. IMPRESSION: Comminuted patellar fracture with associated joint effusion. Electronically Signed   By: Inez Catalina M.D.   On: 06/26/2021 22:01   DG Foot 2 Views Right  Result Date: 06/26/2021 CLINICAL DATA:  Tripping going downstairs EXAM: RIGHT FOOT - 2 VIEW COMPARISON:  Contemporary tibia/fibular radiographs FINDINGS: Supra syndesmotic distal fibular fracture with posterior displacement and angulation and mild comminution. Posterior superiorly displaced fracture of the posterior malleolus. No clear medial malleolar fracture. Lateral talar shift and talar tilting is noted compatible with ankle instability. Large ankle joint effusion. Extensive circumferential soft tissue swelling. Background of mild degenerative changes as well as chronic features suggesting some degree of synostosis of the distal tibiofibular articulation. Bidirectional calcaneal spurring is noted. 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 Associated effusion circumferential ankle swelling. Electronically Signed   By: Lovena Le M.D.   On: 06/26/2021 21:29   DG Femur Min 2 Views Right  Result Date: 06/26/2021 CLINICAL DATA:  Fall EXAM: RIGHT FEMUR 2 VIEWS COMPARISON:  None. FINDINGS: There is no evidence of fracture or other focal bone lesions. Soft tissues are unremarkable. IMPRESSION: Negative. Electronically Signed   By: Donavan Foil M.D.   On: 06/26/2021 20:46     Labs:   Basic Metabolic Panel: Recent Labs  Lab 07/09/21 0332  NA 137  K 3.6  CL 102  CO2 26  GLUCOSE 109*  BUN 10  CREATININE 0.73  CALCIUM 8.7*   GFR Estimated Creatinine Clearance: 84.8 mL/min (by C-G formula based on SCr of 0.73 mg/dL). Liver Function Tests: No results for input(s): AST, ALT, ALKPHOS,  BILITOT, PROT, ALBUMIN in the last 168 hours. No results for input(s): LIPASE, AMYLASE in the last 168 hours. No results for input(s): AMMONIA in the last 168 hours. Coagulation profile No results for input(s): INR, PROTIME in the last 168 hours.  CBC: Recent Labs  Lab 07/07/21 2251 07/09/21 0332 07/11/21 0036  WBC 10.6* 9.5 10.2  NEUTROABS 7.1 6.6  --   HGB 10.7* 9.7* 9.4*  HCT 32.0* 28.9* 28.9*  MCV 93.3 92.9 93.5  PLT 384 312 333   Cardiac Enzymes: No results for input(s): CKTOTAL, CKMB, CKMBINDEX, TROPONINI in the last 168 hours. BNP: Invalid input(s): POCBNP CBG: No results for input(s): GLUCAP in the last 168 hours. D-Dimer No results for input(s): DDIMER in the last 72 hours. Hgb A1c No results for input(s): HGBA1C in the last 72 hours. Lipid Profile No results for input(s): CHOL, HDL, LDLCALC, TRIG, CHOLHDL, LDLDIRECT in the last 72 hours. Thyroid function studies No results for input(s): TSH, T4TOTAL, T3FREE, THYROIDAB in the last 72 hours.  Invalid input(s): FREET3 Anemia work up No results for input(s): VITAMINB12, FOLATE, FERRITIN, TIBC, IRON,  RETICCTPCT in the last 72 hours. Microbiology Recent Results (from the past 240 hour(s))  Culture, blood (routine x 2)     Status: None   Collection Time: 07/07/21 10:51 PM   Specimen: BLOOD  Result Value Ref Range Status   Specimen Description BLOOD RIGHT ANTECUBITAL  Final   Special Requests   Final    BOTTLES DRAWN AEROBIC AND ANAEROBIC Blood Culture adequate volume   Culture   Final    NO GROWTH 5 DAYS Performed at [facility], [address], [place], [place] [address]    Report Status 07/12/2021 FINAL  Final  Culture, blood (routine x 2)     Status: None   Collection Time: 07/07/21 10:51 PM   Specimen: BLOOD RIGHT HAND  Result Value Ref Range Status   Specimen Description BLOOD RIGHT HAND  Final   Special Requests   Final    BOTTLES DRAWN AEROBIC AND ANAEROBIC Blood Culture adequate volume    Culture   Final    NO GROWTH 5 DAYS Performed at [facility], [place] [address], [place], [place] [address]    Report Status 07/12/2021 FINAL  Final     Discharge Instructions:   Discharge Instructions     Call MD for:  difficulty breathing, headache or visual disturbances   Complete by: As directed    Call MD for:  extreme fatigue   Complete by: As directed    Call MD for:  hives   Complete by: As directed    Call MD for:  persistant dizziness or light-headedness   Complete by: As directed    Call MD for:  persistant nausea and vomiting   Complete by: As directed    Call MD for:  redness, tenderness, or signs of infection (pain, swelling, redness, odor or green/yellow discharge around incision site)   Complete by: As directed    Call MD for:  severe uncontrolled pain   Complete by: As directed    Call MD for:  temperature >100.4   Complete by: As directed    Diet - low sodium heart healthy   Complete by: As directed    Discharge instructions   Complete by: As directed    You were cared for by a hospitalist during your hospital stay. If you have any questions about your discharge medications or the care you received while you were in the hospital after you are discharged, you can call the unit and ask to speak with the hospitalist on call if the hospitalist that took care of you is not available. Once you are discharged, your primary care physician will handle any further medical issues. Please note that NO REFILLS for any discharge medications will be authorized once you are discharged, as it is imperative that you return to your primary care physician (or establish a relationship with a primary care physician if you do not have one) for your aftercare needs so that they can reassess your need for medications and monitor your lab values.  Follow up with PCP and Orthopedic Surgery. Take all medications as prescribed. If symptoms change or worsen please return to the ED for  evaluation   Discharge wound care:   Complete by: As directed    Reinforce Dressing   Increase activity slowly   Complete by: As directed       Allergies as of 07/13/2021   No Known Allergies           Discharge Care Instructions  (  From admission, onward)           Start     Ordered   07/06/21 0000  Discharge wound care:       Comments: Reinforce Dressing   07/06/21 0916              Time coordinating discharge: 32 minutes  Signed:  Jennye Boroughs  [facility] 07/13/2021, 10:28 AM   Pager on [email or web address]. If 7PM-7AM, please contact night-coverage at [email or web address]

## 2021-07-13 NOTE — Progress Notes (Signed)
Izora Ribas, MD   Physician  Physical Medicine and Rehabilitation  PMR Pre-admission     Signed  Date of Service:  07/10/2021  3:18 PM       Related encounter: ED to Hosp-Admission (Current) from 06/26/2021 in [facility]      Show:Clear all _0 Written_1 Templated_2 Copied  Added by: _3 Cristina Gong, RN_4 Raulkar, Clide Deutscher, MD_5 Michel Santee, PT  _6 Hover for details                                                                                                                                                                                                                                                                                                                                                                                                                      PMR Admission Coordinator Pre-Admission Assessment   Patient: Courtney Berg is an 65 y.o., female MRN: [id number] DOB: [birth year] Height: _7  (172.7 cm) Weight: 90.7 kg   Insurance Information HMO:     PPO:      PCP:      IPA:      80/20:      OTHER:  PRIMARY: Worker's Comp      Policy#:       Subscriber:  CM NameArbie Cookey      Phone#: [phone]     Fax#: [phone] Pre-Cert#: [id number] auth for CIR from Arbie Cookey   for 7 days with follow up  Employer:  Benefits:  Phone #:      Name:  Eff. Date:      Deduct:       Out of Pocket Max:       Life Max:  CIR:       SNF:  Outpatient:      Co-Pay:  Home Health:       Co-Pay:  DME:      Co-Pay:  Providers:  SECONDARY:       Policy#:      Phone#:    Development worker, community:       Phone#:    The Therapist, art Information Summary" for patients in Inpatient Rehabilitation Facilities with attached "Privacy Act [place] Records" was provided and verbally  reviewed with: n/a   Emergency Contact Information Contact Information       Name Relation Home Work Mobile    Rumpf,William Spouse     [phone]           Current Medical History  Patient Admitting Diagnosis: poly trauma   History of Present Illness: Courtney Berg is a 65 year old right-handed female with history of tobacco use on no prescription medications.  Presented 06/26/2021 after reported fall down approximately 3 stairs while at work.  Denied loss of consciousness.  Admission chemistries unremarkable except WBC 14,600.  Sustained right bimalleolar ankle fracture/dislocation as well as left patella fracture.  Underwent ORIF of right ankle fracture repair of right ankle syndesmosis as well as open reduction fixation left patella 07/01/2021 per Dr. Doreatha Martin.  She is nonweightbearing right lower(able to bear weight on right knee on platform of knee scooter) extremity and weightbearing as tolerated left lower extremity with hinged brace locked in full extension.  Patient is allowed gentle range of motion to right ankle out of cam boot when at rest with leg supported.  Patient did spike a low-grade fever concern for left knee cellulitis initially placed on ceftriaxone plan to switch to Bactrim x5 days 07/09/2021.  Lovenox for DVT prophylaxis.  Therapy evaluations completed due to patient's decreased functional mobility was recommended for a comprehensive rehab program.   Patient's medical record from [facility] has been reviewed by the rehabilitation admission coordinator and physician.   Past Medical History      Past Medical History:  Diagnosis Date   Environmental allergies      pt still getting workup [id number]   Miscarriage        Family History   family history includes Cancer in her father and sister; Colon cancer in her father; Diabetes in her brother and mother; Hypertension in her father.   Prior Rehab/Hospitalizations Has the patient had prior rehab or  hospitalizations prior to admission? No   Has the patient had major surgery during 100 days prior to admission? Yes              Current Medications   Current Facility-Administered Medications:    acetaminophen (TYLENOL) tablet 325-650 mg, 325-650 mg, Oral, Q6H PRN, Delray Alt, PA-C, 650 mg at 07/13/21 0815   bisacodyl (DULCOLAX) suppository 10 mg, 10 mg, Rectal, Daily PRN, Raiford Noble Latif, DO, 10 mg at 07/05/21 2003   cholecalciferol (VITAMIN D3) tablet 2,000 Units, 2,000 Units, Oral, Daily, Delray Alt, PA-C, 2,000 Units at 07/13/21 1010   HYDROcodone-acetaminophen (NORCO) 7.5-325 MG per tablet 1-2 tablet, 1-2 tablet, Oral, Q4H PRN, Delray Alt, PA-C, 2 tablet at 07/03/21 0159   HYDROcodone-acetaminophen (NORCO/VICODIN) 5-325 MG per tablet 1-2 tablet, 1-2 tablet,  Oral, Q4H PRN, Delray Alt, PA-C, 2 tablet at 07/08/21 1008   iron polysaccharides (NIFEREX) capsule 150 mg, 150 mg, Oral, Daily, Sheikh, Omair Latif, DO, 150 mg at 07/13/21 1010   methocarbamol (ROBAXIN) tablet 500 mg, 500 mg, Oral, Q6H PRN, 500 mg at 07/03/21 1722 **OR** methocarbamol (ROBAXIN) 500 mg in dextrose 5 % 50 mL IVPB, 500 mg, Intravenous, Q6H PRN, Ricci Barker, Sarah A, PA-C   metoCLOPramide (REGLAN) tablet 5-10 mg, 5-10 mg, Oral, Q8H PRN **OR** metoCLOPramide (REGLAN) injection 5-10 mg, 5-10 mg, Intravenous, Q8H PRN, Yacobi, Sarah A, PA-C   midodrine (PROAMATINE) tablet 10 mg, 10 mg, Oral, TID WC, Jennye Boroughs, MD, 10 mg at 07/13/21 0815   ondansetron (ZOFRAN) tablet 4 mg, 4 mg, Oral, Q6H PRN **OR** ondansetron (ZOFRAN) injection 4 mg, 4 mg, Intravenous, Q6H PRN, Yacobi, Sarah A, PA-C   polyethylene glycol (MIRALAX / GLYCOLAX) packet 17 g, 17 g, Oral, BID, Sheikh, Omair Latif, DO, 17 g at 07/09/21 0831   senna-docusate (Senokot-S) tablet 1 tablet, 1 tablet, Oral, BID, Raiford Noble Latif, DO, 1 tablet at 07/09/21 7408   sulfamethoxazole-trimethoprim (BACTRIM DS) 800-160 MG per tablet 1 tablet, 1 tablet,  Oral, Q12H, Yacobi, Sarah A, PA-C, 1 tablet at 07/13/21 1010   vitamin B-12 (CYANOCOBALAMIN) tablet 1,000 mcg, 1,000 mcg, Oral, Daily, Sheikh, Omair Latif, DO, 1,000 mcg at 07/13/21 1010   Patients Current Diet:  Diet Order                  Diet - low sodium heart healthy             Diet regular Room service appropriate? Yes; Fluid consistency: Thin  Diet effective now                       Precautions / Restrictions Precautions Precautions: Fall Precaution Comments: orthostatic hypotension Other Brace: CAM walker boot RLE-when OOB- can be off in bed. Restrictions Weight Bearing Restrictions: Yes RLE Weight Bearing: Non weight bearing LLE Weight Bearing: Non weight bearing Other Position/Activity Restrictions: pt is allowed gentle ROM to R ankle out of CAM boot when at rest with leg supported    Has the patient had 2 or more falls or a fall with injury in the past year? Yes   Prior Activity Level Community (5-7x/wk): working full time, no limitations, no DME, driving   Prior Functional Level Self Care: Did the patient need help bathing, dressing, using the toilet or eating? Independent   Indoor Mobility: Did the patient need assistance with walking from room to room (with or without device)? Independent   Stairs: Did the patient need assistance with internal or external stairs (with or without device)? Independent   Functional Cognition: Did the patient need help planning regular tasks such as shopping or remembering to take medications? Independent   Home Assistive Devices / Equipment Home Assistive Devices/Equipment: None Home Equipment: Bedside commode, Tub bench, Walker - 2 wheels, Wheelchair - manual, Grab bars - tub/shower, Crutches (WC with no foot rests)   Spouse has multiple DME from his deceased Dad who was wheelchair level   Prior Device Use: Indicate devices/aids used by the patient prior to current illness, exacerbation or injury? None of the above    Current Functional Level Cognition   Overall Cognitive Status: Within Functional Limits for tasks assessed Orientation Level: Oriented X4 General Comments: Pt follows cues and problem-solves appropriately.    Extremity Assessment (includes Sensation/Coordination)   Upper Extremity Assessment: Overall  WFL for tasks assessed  Lower Extremity Assessment: Defer to PT evaluation RLE Deficits / Details: good strength at hip and knee, lower leg NT due to NWB orders and CAM walker boot. Pt unable to wiggle toes or move R ankle RLE: Unable to fully assess due to immobilization RLE Sensation: decreased light touch (no sensation mid-calf down) RLE Coordination: decreased fine motor, decreased gross motor LLE Deficits / Details: pt must be kept in [place] for mobility, difficulty with hip ABD to move OOB. able to maintain standing with KI and no knee buckling. LLE: Unable to fully assess due to immobilization LLE Sensation: WNL     ADLs   Overall ADL's : Needs assistance/impaired Eating/Feeding: Independent, Sitting Grooming: Set up, Sitting Upper Body Bathing: Set up, Sitting Lower Body Bathing: Minimal assistance, Sitting/lateral leans Lower Body Bathing Details (indicate cue type and reason): min A for rear peri area in sidelying Upper Body Dressing : Min guard, Sitting Lower Body Dressing: Maximal assistance, +2 for safety/equipment, +2 for physical assistance, Sitting/lateral leans, Sit to/from stand Toilet Transfer: Total assistance Toilet Transfer Details (indicate cue type and reason): Pt on bed pan upon arrival, otherwise max A for posterior transfer from bed>BSC Toileting- Clothing Manipulation and Hygiene: Maximal assistance, +2 for physical assistance, +2 for safety/equipment Tub/ Shower Transfer: Maximal assistance, +2 for physical assistance, +2 for safety/equipment, Stand-pivot Functional mobility during ADLs:  (session focused on changing position and management of orthostatic  hypotension) General ADL Comments: Pt declined all grooming and ADL needs while sitting EOB     Mobility   Overal bed mobility: Needs Assistance Bed Mobility: Supine to Sit Rolling: Min guard Supine to sit: HOB elevated, Supervision Sit to supine: Min assist General bed mobility comments: pt received seated EOB, RN present and had assisted her up.     Transfers   Overall transfer level: Needs assistance Equipment used: Rolling walker (2 wheeled) Transfers: Sit to/from Stand, Stand Pivot Transfers Sit to Stand: +2 physical assistance, From elevated surface, Mod assist Stand pivot transfers: +2 physical assistance, +2 safety/equipment, Mod assist, From elevated surface Anterior-Posterior transfers: Mod assist  Lateral/Scoot Transfers: Min assist General transfer comment: Sit to stand from EOB with modA +2 to assist in anterior weight shift of body onto L leg locked in extension and to keep R foot off ground. BP taken in stance. Stand pivot to wheelchair, modA to pivot and chair brought close behind her due to inability to hop on extended LLE.     Ambulation / Gait / Stairs / Wheelchair Mobility   Ambulation/Gait Ambulation/Gait assistance:  (unable due to soft BP and need to return to sitting.) Gait Distance (Feet): 4 Feet (gt limited due to dizziness.) Assistive device:  (knee walker) Gait Pattern/deviations: Step-to pattern General Gait Details: Unable to take steps today. Product manager mobility: Yes Wheelchair propulsion: Both upper extremities Wheelchair parts: Supervision/cueing Distance: 150 Wheelchair Assistance Details (indicate cue type and reason): Cues for locking brakes and cues for use of wheel rails to drive wheels to move forward, backward and for turns. Cues for activity pacing and energy conservation, good carryover.     Posture / Balance Dynamic Sitting Balance Sitting balance - Comments: L leg extended, pt leaning and propping back on hands for  support. Balance Overall balance assessment: Needs assistance, History of Falls Sitting-balance support: Feet supported, Bilateral upper extremity supported, Single extremity supported Sitting balance-Leahy Scale: Poor Sitting balance - Comments: L leg extended, pt leaning and propping back on hands for support. Standing  balance support: Bilateral upper extremity supported, During functional activity Standing balance-Leahy Scale: Poor Standing balance comment: Reliant on bil UE support of RW and modA +2 for standing.     Special needs/care consideration Skin: surgical incisions to RLE and LLE    Previous Home Environment  Living Arrangements: Spouse/significant other Available Help at Discharge: Family, Available 24 hours/day Type of Home: House Home Layout: One level Home Access: Stairs to enter, Ramped entrance Entrance Stairs-Rails: Right, Left Entrance Stairs-Number of Steps: 4 Bathroom Shower/Tub: Tub/shower unit, Architectural technologist: [place]: No   Discharge Living Setting Plans for Discharge Living Setting: Patient's home Type of Home at Discharge: House Discharge Home Layout: One level Discharge Home Access: [place] entrance Discharge Bathroom Shower/Tub: Walk-in shower Discharge Bathroom Toilet: Standard Discharge Bathroom Accessibility: Yes How Accessible: Accessible via wheelchair Does the patient have any problems obtaining your medications?: No   Social/Family/Support Systems Patient Roles: Spouse Anticipated Caregiver: spouse, Gwyndolyn Saxon "Bill" Aggarwal Anticipated Caregiver's Contact Information: [phone] Ability/Limitations of Caregiver: mod assist Caregiver Availability: 24/7 Discharge Plan Discussed with Primary Caregiver: Yes Is Caregiver In Agreement with Plan?: Yes Does Caregiver/Family have Issues with Lodging/Transportation while Pt is in Rehab?: No   Goals Patient/Family Goal for Rehab: PT/OT supervision to min, w/c level, SLP  n/a Expected length of stay: 10-14 days Pt/Family Agrees to Admission and willing to participate: Yes Program Orientation Provided & Reviewed with Pt/Caregiver Including Roles  & Responsibilities: Yes   Decrease burden of Care through IP rehab admission: n/a   Possible need for SNF placement upon discharge: NO   Patient Condition: I have reviewed medical records from [facility], spoken with CM, and patient and spouse. I met with patient at the bedside for inpatient rehabilitation assessment.  Patient will benefit from ongoing PT and OT, can actively participate in 3 hours of therapy a day 5 days of the week, and can make measurable gains during the admission.  Patient will also benefit from the coordinated team approach during an Inpatient Acute Rehabilitation admission.  The patient will receive intensive therapy as well as Rehabilitation physician, nursing, social worker, and care management interventions.  Due to safety, skin/wound care, medication administration, pain management, and patient education the patient requires 24 hour a day rehabilitation nursing.  The patient is currently mod +2 with mobility and basic ADLs.  Discharge setting and therapy post discharge at home with home health is anticipated.  Patient has agreed to participate in the Acute Inpatient Rehabilitation Program and will admit today.   Preadmission Screen Completed By: Shann Medal with updates by Cleatrice Burke, 07/13/2021 11:20 AM ______________________________________________________________________   Discussed status with Dr. Ranell Patrick on 07/13/2021 at 1118 and received approval for admission today.   Admission Coordinator: Shann Medal with updates by Cleatrice Burke, RN, time 6948 Date 07/13/2021    Assessment/Plan: Diagnosis: Polytrauma with right tibia/fibular fracture Does the need for close, 24 hr/day Medical supervision in concert with the patient's rehab needs make it unreasonable for this  patient to be served in a less intensive setting? Yes Co-Morbidities requiring supervision/potential complications: left patella fracture, bimalleolar right ankle fracture, orthostatic hypotension, dyspnea on exertion, allergic urticaria Due to bladder management, bowel management, safety, skin/wound care, disease management, medication administration, pain management, and patient education, does the patient require 24 hr/day rehab nursing? Yes Does the patient require coordinated care of a physician, rehab nurse, PT, OT to address physical and functional deficits in the context of the above medical diagnosis(es)? Yes Addressing deficits  in the following areas: balance, endurance, locomotion, strength, transferring, bowel/bladder control, bathing, dressing, feeding, grooming, toileting, and psychosocial support Can the patient actively participate in an intensive therapy program of at least 3 hrs of therapy 5 days a week? Yes The potential for patient to make measurable gains while on inpatient rehab is excellent Anticipated functional outcomes upon discharge from inpatient rehab: min assist PT, min assist OT, independent SLP Estimated rehab length of stay to reach the above functional goals is: 2 weeks Anticipated discharge destination: Home 10. Overall Rehab/Functional Prognosis: good     MD Signature: Leeroy Cha, MD         Revision History                               Note Details  Author Izora Ribas, MD File Time 07/13/2021 11:36 AM  Author Type Physician Status Signed  Last Editor Izora Ribas, MD Service Physical Medicine and Rehabilitation

## 2021-07-13 NOTE — Progress Notes (Signed)
Orders received for patient to transfer to CIR. Awaiting CIR to notify RN that bed is available and ready for transfer.

## 2021-07-13 NOTE — Progress Notes (Signed)
Orthopaedic Trauma Progress Note  SUBJECTIVE: Doing fairly well today.  Pain is controlled.  Is scheduled to discharge to CIR later today.  Continues to have some issues with blood pressure whenever she is getting up.  Husband at bedside currently  OBJECTIVE:  Vitals:   07/13/21 0744 07/13/21 1153  BP: (!) 116/58 (!) 129/56  Pulse: 66 65  Resp: 17 18  Temp: 98.2 F (36.8 C) 98.2 F (36.8 C)  SpO2: 97% 98%    General: Sitting up in bed, NAD Respiratory: No increased work of breathing.  LLE: Hinge brace in place.  Redness around incision much improved. Able to wiggle toes.  Ankle dorsiflexion/plantarflexion intact.  Endorses sensation to light touch throughout the extremity.  Compartments soft and compressible. + DP pulse  RLE: Incisions clean, dry, intact with no drainage. Bruising about the ankle/foot as expected. Ankle DF/PF intact but stiff. Able to wiggle toes.  Some tenderness with palpation through the calf, likely muscle soreness.  Compartments soft and compressible.  Endorses sensation to light touch over all aspects of foot. Foot is warm and well-perfused. +DP pulse  IMAGING: Stable post op imaging.  Plan for repeat x-rays left knee, right ankle 07/15/2021  LABS:  No results found for this or any previous visit (from the past 24 hour(s)).   ASSESSMENT: Courtney Berg is a 65 y.o. female, 12 Days Post-Op s/p Procedure(s): ORIF RIGHT ANKLE FRACTURE ORIF LEFT PATELLA  CV/Blood loss: Hgb stable  PLAN: Weightbearing: NWB RLE, WBAT LLE with hinge brace locked in full extension ROM: Ok for R ankle ROM as tolerated at rest Incisional and dressing care:  Ok to leave open to air Showering: Ok to shower with assistance. Incisions may get wet Orthopedic device(s): CAM boot RLE when OOB, Bledsoe brace LLE Pain management:  1. Tylenol 325-650 mg q 6 hours PRN 2. Robaxin 500 mg q 6 hours PRN 3. Norco 5-325 mg q 4 hours PRN moderate pain OR Norco 7.5-325 mg q 4 hours PRN severe  pain  VTE prophylaxis: Lovenox, SCDs ID: Bactrim DS scheduled to finish today Foley/Lines:  No foley, KVO IVFs Impediments to Fracture Healing: Vit D level 19, continue on D3 supplementation. Recommend continuing this at discharge  Dispo: Therapies as tolerated, PT/OT recommending CIR. okay for discharge to CIR today from orthopedic standpoint.  Family Communication: Discussed plan today with patient and her husband at bedside.   Follow - up plan: We will continue to follow while in hospital.  Plan for repeat imaging Wednesday, 07/15/2021.  We will plan to remove sutures then as well.  Plan for outpatient follow-up 2 weeks after d/c for repeat x-rays and wound check  Contact information:  Courtney Hamming MD, Courtney Pace PA-C. After hours and holidays please check [email or web address] for group call information for [facility]   Courtney Berg A. Ricci Barker, PA-C [phone] (office) [email or web address]

## 2021-07-14 ENCOUNTER — Inpatient Hospital Stay (HOSPITAL_COMMUNITY): Payer: Worker's Compensation

## 2021-07-14 DIAGNOSIS — M7989 Other specified soft tissue disorders: Secondary | ICD-10-CM | POA: Diagnosis not present

## 2021-07-14 DIAGNOSIS — S82891A Other fracture of right lower leg, initial encounter for closed fracture: Secondary | ICD-10-CM

## 2021-07-14 LAB — CBC WITH DIFFERENTIAL/PLATELET
Abs Immature Granulocytes: 0.06 10*3/uL (ref 0.00–0.07)
Basophils Absolute: 0.1 10*3/uL (ref 0.0–0.1)
Basophils Relative: 1 %
Eosinophils Absolute: 0.3 10*3/uL (ref 0.0–0.5)
Eosinophils Relative: 3 %
HCT: 31.7 % — ABNORMAL LOW (ref 36.0–46.0)
Hemoglobin: 10.4 g/dL — ABNORMAL LOW (ref 12.0–15.0)
Immature Granulocytes: 1 %
Lymphocytes Relative: 19 %
Lymphs Abs: 1.7 10*3/uL (ref 0.7–4.0)
MCH: 30.4 pg (ref 26.0–34.0)
MCHC: 32.8 g/dL (ref 30.0–36.0)
MCV: 92.7 fL (ref 80.0–100.0)
Monocytes Absolute: 0.8 10*3/uL (ref 0.1–1.0)
Monocytes Relative: 9 %
Neutro Abs: 6.1 10*3/uL (ref 1.7–7.7)
Neutrophils Relative %: 67 %
Platelets: 417 10*3/uL — ABNORMAL HIGH (ref 150–400)
RBC: 3.42 MIL/uL — ABNORMAL LOW (ref 3.87–5.11)
RDW: 13.2 % (ref 11.5–15.5)
WBC: 9.1 10*3/uL (ref 4.0–10.5)
nRBC: 0 % (ref 0.0–0.2)

## 2021-07-14 LAB — COMPREHENSIVE METABOLIC PANEL
ALT: 10 U/L (ref 0–44)
AST: 16 U/L (ref 15–41)
Albumin: 3.2 g/dL — ABNORMAL LOW (ref 3.5–5.0)
Alkaline Phosphatase: 54 U/L (ref 38–126)
Anion gap: 7 (ref 5–15)
BUN: 11 mg/dL (ref 8–23)
CO2: 27 mmol/L (ref 22–32)
Calcium: 9.3 mg/dL (ref 8.9–10.3)
Chloride: 102 mmol/L (ref 98–111)
Creatinine, Ser: 0.91 mg/dL (ref 0.44–1.00)
GFR, Estimated: >60 mL/min (ref >60)
Glucose, Bld: 98 mg/dL (ref 70–99)
Potassium: 4.4 mmol/L (ref 3.5–5.1)
Sodium: 136 mmol/L (ref 135–145)
Total Bilirubin: 0.6 mg/dL (ref 0.3–1.2)
Total Protein: 6.9 g/dL (ref 6.5–8.1)

## 2021-07-14 MED ORDER — APIXABAN 5 MG PO TABS
10.0000 mg | ORAL_TABLET | Freq: Two times a day (BID) | ORAL | Status: AC
  Administered 2021-07-14 – 2021-07-21 (×14): 10 mg via ORAL
  Filled 2021-07-14 (×14): qty 2

## 2021-07-14 MED ORDER — APIXABAN 5 MG PO TABS
5.0000 mg | ORAL_TABLET | Freq: Two times a day (BID) | ORAL | Status: DC
  Administered 2021-07-21 – 2021-07-23 (×4): 5 mg via ORAL
  Filled 2021-07-14 (×4): qty 1

## 2021-07-14 MED ORDER — METOCLOPRAMIDE HCL 5 MG PO TABS
5.0000 mg | ORAL_TABLET | Freq: Three times a day (TID) | ORAL | Status: DC
  Administered 2021-07-14 – 2021-07-23 (×34): 5 mg via ORAL
  Filled 2021-07-14 (×34): qty 1

## 2021-07-14 NOTE — Progress Notes (Signed)
Called Provider Reesa Chew, [place] with patient results of venous bilateral  venous duplex completes and orders to make patient total bedrest. Made patient aware. Will continue to monitor

## 2021-07-14 NOTE — Patient Care Conference (Signed)
Inpatient RehabilitationTeam Conference and Plan of Care Update Date: 07/14/2021   Time: 12:12  PM   Patient Name: Courtney Berg      Medical Record Number: [id number]  Date of Birth: 1956-08-08 Sex: Female         Room/Bed: 4M12C/4M12C-01 Payor Info: Payor: Theme park manager / Plan: [place] / Product Type: *No Product type* /    Admit Date/Time:  07/13/2021  4:27 PM  Primary Diagnosis:  <principal problem not specified>  Hospital Problems: Active Problems:   Closed right ankle fracture    Expected Discharge Date: Expected Discharge Date: 07/28/21  Team Members Present: Physician leading conference: Dr. Leeroy Cha Social Worker Present: Ovidio Kin, LCSW Nurse Present: Dorien Chihuahua, RN PT Present: Francena Hanly, PT OT Present: Glean Salen, OT PPS Coordinator present : Ileana Ladd, PT     Current Status/Progress Goal Weekly Team Focus  Bowel/Bladder             Swallow/Nutrition/ Hydration             ADL's   Min A lateral scoot transfers, Min A UB bathing/dressing, Max A LB bathing, LB dressing not assessed yet  Supervision  ADL retraining, functional transfers, sit > stand, activity tolerance/endurance, BP management   Mobility   min A bed mobility and transfers  Supervision  Orthostatic management, transfers, RLE ROM   Communication             Safety/Cognition/ Behavioral Observations            Pain             Skin               Discharge Planning:  New evaluation home with husband and will confirm how much assist he can provide   Team Discussion: Patient admitted post fall and ORIF ankle and patella. Patient with hypotension; MD orderd binder. Was on midodrine for BP.  Anemia and nausea addressed per MD Patient on target to meet rehab goals: yes, currently min assist for transfers. Goals for discharge set at supervision overall.  *See Care Plan and progress notes for long and short-term goals.   Revisions to Treatment  Plan:  Address lovenox education as needed for discharge   Teaching Needs: Safety, precautions, medications, skin care, braces/CAM boot, etc.  Current Barriers to Discharge: Decreased caregiver support, Home enviroment access/layout, and Weight bearing restrictions  Possible Resolutions to Barriers: Family education     Medical Summary Current Status: nausea, hypotension, obesity (BMI 30.03), postoperative pain  Barriers to Discharge: Medical stability;Weight  Barriers to Discharge Comments: nausea, hypotension, obesity (BMI 30.03), postoperative pain Possible Resolutions to [employer] Focus: trial abdominal binder, continue dietary education, continue tylenol, KUB shows ileus- will discuss with patient   Continued Need for Acute Rehabilitation Level of Care: The patient requires daily medical management by a physician with specialized training in physical medicine and rehabilitation for the following reasons: Direction of a multidisciplinary physical rehabilitation program to maximize functional independence : Yes Medical management of patient stability for increased activity during participation in an intensive rehabilitation regime.: Yes Analysis of laboratory values and/or radiology reports with any subsequent need for medication adjustment and/or medical intervention. : Yes   I attest that I was present, lead the team conference, and concur with the assessment and plan of the team.   Dorien Chihuahua B 07/14/2021, 2:22 PM

## 2021-07-14 NOTE — H&P (Signed)
Physical Medicine and Rehabilitation Admission H&P         Chief Complaint  Patient presents with   Functional deficits secondary to       Ankle/patella Fx      HPI: Courtney Berg is a 65 year old female in relatively good health who was admitted on 06/26/2021 after tripping and falling off 3 stairs.  She sustained a right bimalleolar ankle fracture and left patella fracture which was splinted and trauma Ortho consulted for input.  She was taken to the OR for ORIF right bimalleolar ankle fracture with repair of ankle syndesmosis and ORIF left patella on 08/10 by Dr. Doreatha Martin.  Postop to be WBAT on LLE with knee locked in extension and NWB RLE with Lovenox for DVT prophylaxis.  Bledsoe brace to be worn at all times and CAM boot on RLE when [facility] course significant for issues with urinary retention requiring in and out catheterizations, hypotension, constipation, mild hyponatremia as well as stress-induced hyperglycemia.  Midodrine added for blood pressure support with improvement in symptoms.  She has had some issues with leukocytosis as well as redness surrounding patient --blood cultures x2 done 08/16 which were negative. She was started on septra by ortho for 5 day course to complete 08/23.  Pain is controlled and she continues to have diarrhea after meals. Therapy ongoing but patient limited by dizziness with orthostatic changes and weakness affecting ADLs and mobility . CIR recommended due to functional decline. Her husband is an Programme researcher, broadcasting/film/video and has already set up a lot of equipment for her at home.     Review of Systems  Constitutional:  Negative for chills and fever.  HENT:  Negative for hearing loss.   Eyes:  Negative for blurred vision and double vision.  Respiratory:  Negative for cough, shortness of breath and stridor.   Cardiovascular:  Negative for chest pain.  Gastrointestinal:  Positive for diarrhea (after meals/sensitive stomach). Negative for abdominal pain,  heartburn, nausea and vomiting.  Genitourinary:  Negative for dysuria and urgency.  Musculoskeletal:  Positive for joint pain (mild around "1"). Negative for myalgias and neck pain.  Skin:  Negative for rash.  Neurological:  Positive for dizziness (with positional changes/orthosatic changes) and weakness.  Psychiatric/Behavioral:  The patient has insomnia (needs quiet and dark/clock bothering her).           Past Medical History:  Diagnosis Date   Environmental allergies      pt still getting workup [id number]   Miscarriage             Past Surgical History:  Procedure Laterality Date   DILATION AND CURETTAGE OF UTERUS        post miscarriage   ORIF ANKLE FRACTURE Right 07/01/2021    Procedure: OPEN REDUCTION INTERNAL FIXATION (ORIF) ANKLE FRACTURE;  Surgeon: Shona Needles, MD;  Location: [place];  Service: Orthopedics;  Laterality: Right;   ORIF PATELLA Left 07/01/2021    Procedure: OPEN REDUCTION INTERNAL (ORIF) FIXATION PATELLA;  Surgeon: Shona Needles, MD;  Location: [place];  Service: Orthopedics;  Laterality: Left;   TONSILLECTOMY       WISDOM TOOTH EXTRACTION               Family History  Problem Relation Age of Onset   Diabetes Mother     Hypertension Father     Cancer Father          stomach cancer @ 41. colon cancer @  64   Colon cancer Father     Diabetes Brother     Cancer Sister          leukemia      Social History:  Married. 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 She reports that she quit smoking about 35 years ago. Her smoking use included cigarettes. She started smoking about 42 years ago. She has a 1.75 pack-year smoking history. She has never used smokeless tobacco. She reports that she does not drink alcohol and does not use drugs.     Allergies: No Known Allergies           Medications Prior to Admission  Medication Sig Dispense Refill   acetaminophen (TYLENOL) 325 MG tablet Take 650 mg by mouth every 6 (six) hours as needed for  moderate pain or headache.       ibuprofen (ADVIL) 200 MG tablet Take 400 mg by mouth every 6 (six) hours as needed for headache or moderate pain.       COVID-19 mRNA vaccine, Pfizer, 30 MCG/0.3ML injection USE AS DIRECTED (Patient not taking: Reported on 06/26/2021) .3 mL 0      Drug Regimen Review  Drug regimen was reviewed and remains appropriate with no significant issues identified   Home: Home Living Family/patient expects to be discharged to:: Private residence Living Arrangements: Spouse/significant other Available Help at Discharge: Family, Available 24 hours/day Type of Home: House Home Access: Stairs to enter, Ramped entrance Technical brewer of Steps: 4 Entrance Stairs-Rails: Right, Left Home Layout: One level Bathroom Shower/Tub: Tub/shower unit, Architectural technologist: Standard Home Equipment: Bedside commode, Tub bench, Environmental consultant - 2 wheels, Wheelchair - manual, Grab bars - tub/shower, Crutches (WC with no foot rests)   Functional History: Prior Function Level of Independence: Independent Comments: Independent, working full time as Therapist, sports for [employer]. (can do from home), owns horses, still riding when able   Functional Status:  Mobility: Bed Mobility Overal bed mobility: Needs Assistance Bed Mobility: Supine to Sit Rolling: Min guard Supine to sit: HOB elevated, Supervision Sit to supine: Min assist General bed mobility comments: pt received seated EOB, RN present and had assisted her up. Transfers Overall transfer level: Needs assistance Equipment used: Rolling walker (2 wheeled) Transfers: Sit to/from Stand, Stand Pivot Transfers Sit to Stand: +2 physical assistance, From elevated surface, Mod assist Stand pivot transfers: +2 physical assistance, +2 safety/equipment, Mod assist, From elevated surface Anterior-Posterior transfers: Mod assist  Lateral/Scoot Transfers: Min assist General transfer comment: Sit to stand from EOB with modA +2 to  assist in anterior weight shift of body onto L leg locked in extension and to keep R foot off ground. BP taken in stance. Stand pivot to wheelchair, modA to pivot and chair brought close behind her due to inability to hop on extended LLE. Ambulation/Gait Ambulation/Gait assistance:  (unable due to soft BP and need to return to sitting.) Gait Distance (Feet): 4 Feet (gt limited due to dizziness.) Assistive device:  (knee walker) Gait Pattern/deviations: Step-to pattern General Gait Details: Unable to take steps today. Product manager mobility: Yes Wheelchair propulsion: Both upper extremities Wheelchair parts: Supervision/cueing Distance: 150 Wheelchair Assistance Details (indicate cue type and reason): Cues for locking brakes and cues for use of wheel rails to drive wheels to move forward, backward and for turns. Cues for activity pacing and energy conservation, good carryover.   ADL: ADL Overall ADL's : Needs assistance/impaired Eating/Feeding: Independent, Sitting Grooming: Set up, Sitting Upper Body Bathing: Set  up, Sitting Lower Body Bathing: Minimal assistance, Sitting/lateral leans Lower Body Bathing Details (indicate cue type and reason): min A for rear peri area in sidelying Upper Body Dressing : Min guard, Sitting Lower Body Dressing: Maximal assistance, +2 for safety/equipment, +2 for physical assistance, Sitting/lateral leans, Sit to/from stand Toilet Transfer: Total assistance Toilet Transfer Details (indicate cue type and reason): Pt on bed pan upon arrival, otherwise max A for posterior transfer from bed>BSC Toileting- Clothing Manipulation and Hygiene: Maximal assistance, +2 for physical assistance, +2 for safety/equipment Tub/ Shower Transfer: Maximal assistance, +2 for physical assistance, +2 for safety/equipment, Stand-pivot Functional mobility during ADLs:  (session focused on changing position and management of orthostatic hypotension) General ADL  Comments: Pt declined all grooming and ADL needs while sitting EOB   Cognition: Cognition Overall Cognitive Status: Within Functional Limits for tasks assessed Orientation Level: Oriented X4 Cognition Arousal/Alertness: Awake/alert Behavior During Therapy: WFL for tasks assessed/performed Overall Cognitive Status: Within Functional Limits for tasks assessed General Comments: Pt follows cues and problem-solves appropriately.     Blood pressure (!) 129/56, pulse 65, temperature 98.2 F (36.8 C), temperature source Oral, resp. rate 18, height '5\' 8"'$  (1.727 m), weight 90.7 kg, SpO2 98 %. Physical Exam Gen: no distress, normal appearing HEENT: oral mucosa pink and moist, NCAT Cardio: Reg rate Chest: normal effort, normal rate of breathing Abd: soft, non-distended Ext: no edema Psych: pleasant, normal affect Skin: see MSK Neuro: AOx3 Musculoskeletal:     Comments: Left knee incision C/D/I with sutures in place and mild erythema (decreased per ortho). Right ankle with flat blisters and lateral incision intact with sutures in place. Resolving ecchymosis medially.  Able to DF/PF right foot and flex right hip well, Left lower extremity mobility limited by brace   Lab Results Last 48 Hours  No results found for this or any previous visit (from the past 48 hour(s)).   Imaging Results (Last 48 hours)  No results found.           Medical Problem List and Plan: 1.  Polytrauma with multiple fractures             -patient may shower but all incisions must be covered             -ELOS/Goals: 2 weeks MinA 2.  Impaired mobility:  -DVT/anticoagulation: Continue Lovenox.              -antiplatelet therapy: N/A 3. Post-operative pain: controlled without narcotics at this time. Continue Tylenol PRN. LFTs reviewed and stable.  4. Mood: LCSW to follow for evaluation and support.              -antipsychotic agents: N/A 5. Neuropsych: This patient is capable of making decisions on her own  behalf. 6. Skin/Wound Care: Monitor wound for healing.  7. Fluids/Electrolytes/Nutrition: Monitor I/O. Check lytes in am.  8.  Right ankle bimalleolar Fx s/p ORIF:  NWB RLE--CAM boot on when OOB.  9. Left patella Fx: WBAT with Timmothy Sours Joy brace locked in full extension.              --may remove brace in shower as long as knee kept in extension and brace replaced afterwards.   10. Acute blood loss anemia: Recheck CBC in am. Monitor for signs of bleeding.  11. Diarrhea: Has been refusing laxatives since 08/19-->change to prn. --Likely due to antibiotics as well as food sensitivities. Will add fiber for bulking.  12. Orthostatic hypotension: Monitor BP tid as well as orthostatic vitals--continues  to have dizziness. Abdominal binder ordered -- Continue midodrine for BP support.  13.Cellulitis right knee/ankle?: Resolving with Septra DS--end date 08/23.             --Leucocytosis stable around 10 range.              --Ortho to follow up Wed for eval/suture removal.      I have personally performed a face to face diagnostic evaluation, including, but not limited to relevant history and physical exam findings, of this patient and developed relevant assessment and plan.  Additionally, I have reviewed and concur with the physician assistant's documentation above.   Leeroy Cha, MD   Bary Leriche, PA-C 07/13/2021

## 2021-07-14 NOTE — Plan of Care (Signed)
  Problem: Sit to Stand Goal: LTG:  Patient will perform sit to stand with assistance level (PT) Description: LTG:  Patient will perform sit to stand with assistance level (PT) Flowsheets (Taken 07/14/2021 1607) LTG: PT will perform sit to stand in preparation for functional mobility with assistance level: (LRAD) Supervision/Verbal cueing   Problem: RH Bed to Chair Transfers Goal: LTG Patient will perform bed/chair transfers w/assist (PT) Description: LTG: Patient will perform bed to chair transfers with assistance (PT). Flowsheets (Taken 07/14/2021 1607) LTG: Pt will perform Bed to Chair Transfers with assistance level: (LRAD) Supervision/Verbal cueing   Problem: RH Car Transfers Goal: LTG Patient will perform car transfers with assist (PT) Description: LTG: Patient will perform car transfers with assistance (PT). Flowsheets (Taken 07/14/2021 1607) LTG: Pt will perform car transfers with assist:: (LRAD) Supervision/Verbal cueing   Problem: RH Ambulation Goal: LTG Patient will ambulate in home environment (PT) Description: LTG: Patient will ambulate in home environment, # of feet with assistance (PT). Flowsheets (Taken 07/14/2021 1607) LTG: Pt will ambulate in home environ  assist needed:: (LRAD) Minimal Assistance - Patient > 75% LTG: Ambulation distance in home environment: 41'   Problem: RH Wheelchair Mobility Goal: LTG Patient will propel w/c in controlled environment (PT) Description: LTG: Patient will propel wheelchair in controlled environment, # of feet with assist (PT) Flowsheets (Taken 07/14/2021 1607) LTG: Pt will propel w/c in controlled environ  assist needed:: Set up assist LTG: Propel w/c distance in controlled environment: 150' Goal: LTG Patient will propel w/c in home environment (PT) Description: LTG: Patient will propel wheelchair in home environment, # of feet with assistance (PT). Flowsheets (Taken 07/14/2021 1607) LTG: Pt will propel w/c in home environ  assist  needed:: Set up assist Distance: wheelchair distance in controlled environment: 75 LTG: Propel w/c distance in home environment: 56'

## 2021-07-14 NOTE — Progress Notes (Signed)
Occupational Therapy Session Note  Patient Details  Name: Courtney Berg MRN: [id number] Date of Birth: [birth year]  Today's Date: 07/14/2021 OT Individual Time: 1345-1430 OT Individual Time Calculation (min): 45 min    Short Term Goals: Week 1:  OT Short Term Goal 1 (Week 1): Pt will complete LB dressing with mod assist with AE as needed OT Short Term Goal 2 (Week 1): Pt will complete squat pivot transfers with min assist without change in BP OT Short Term Goal 3 (Week 1): Pt will complete sit > stand with min assist to prepare for LB dressing at sit > stand level  Skilled Therapeutic Interventions/Progress Updates:  Patient in bed, alert and notes that she has nausea.  She is concerned about BP dropping this am.  Reviewed options for lateral transfers and benefits of sitting edge of bed.  She is able to move from supine to edge of bed with CGA.  Able to donn short with mod/max A due to bilateral leg bracing, reviewed use of reacher for future practice.  She returned to supine for clothing management via rolling side to side with CS.  Completed sliding board transfer to drop arm commode with min A.  Pants down with mod A.   Hand off to PT while she was in this position for additional therapy this afternoon.      Therapy Documentation Precautions:  Precautions Precautions: Fall Precaution Comments: orthostatic hypotension Required Braces or Orthoses: Other Brace, Knee Immobilizer - Left (CAM boot on RLE when OOB) Knee Immobilizer - Left: On at all times (L knee locked in extension) Other Brace: CAM walker boot RLE-when OOB- can be off in bed. Restrictions Weight Bearing Restrictions: Yes RLE Weight Bearing: Non weight bearing LLE Weight Bearing: Weight bearing as tolerated Other Position/Activity Restrictions: pt is allowed gentle ROM to R ankle out of CAM boot when at rest with leg supported - per acute chart review   Therapy/Group: Individual Therapy  Carlos Levering 07/14/2021, 3:35 PM

## 2021-07-14 NOTE — Progress Notes (Signed)
Patient ID: Courtney Berg, female   DOB: 04/01/1956, 65 y.o.   MRN: [id number] Met with the patient to introduce self and review role. Reviewed current status with ?ileus however dietary modifications pending. Patient with Vit D, Vit B, Protein and Iron deficiency, supplementation and dietary changes pending ileus treatment.Just returned from xray, follow up on fractures. Patient has a bledsoe  brace and CAM boot. Patient with orthostatic hypotension on midodrine; MD added abd binder when OOB. Continue to follow along to discharge to address educational needs and collaborate with the team to facilitate preparation for discharge. Margarito Liner

## 2021-07-14 NOTE — Progress Notes (Signed)
Physical Therapy Session Note  Patient Details  Name: Courtney Berg MRN: [id number] Date of Birth: 1955-11-25  Today's Date: 07/14/2021 PT Individual Time: [id number] PT Individual Time Calculation (min): 32 min   Short Term Goals: Week 1:  PT Short Term Goal 1 (Week 1): Pt will perform bed mobility w/supervision for improved functional mobility PT Short Term Goal 2 (Week 1): Pt will perform sit <>stand w/LRAD and CGA while maintaining WB precautions PT Short Term Goal 3 (Week 1): Pt will perform bed <>chair transfers w/min A and LRAD  Skilled Therapeutic Interventions/Progress Updates:  Pt received sitting on BSC w/OT, having BM. Handoff w/OT. Pt reported 4/10 pain in head and requested pain meds, nursing notified. Pt frustrated over new diet orders, called MD for clarification and she is recommending NPO 2/2 ileus. Pt performed sit <>mini stand w/min A and UE support on bedrails to perform peri care w/total A. Sliding board transfer from A [place] [facility] to bed w/min A and assistance setting up board. Sit <>supine using trapeze bar and CGA. Scoot to [facility] w/supervision using rails. Pt rolled L & R using bedrails w/supervision to doff shorts w/min A. Pt was left supine in bed w/all needs in reach, nursing in room to administer meds.    Therapy Documentation Precautions:  Precautions Precautions: Fall Precaution Comments: orthostatic hypotension Required Braces or Orthoses: Other Brace, Knee Immobilizer - Left (CAM boot on RLE when OOB) Knee Immobilizer - Left: On at all times (L knee locked in extension) Other Brace: CAM walker boot RLE-when OOB- can be off in bed. Restrictions Weight Bearing Restrictions: Yes RLE Weight Bearing: Non weight bearing LLE Weight Bearing: Weight bearing as tolerated Other Position/Activity Restrictions: pt is allowed gentle ROM to R ankle out of CAM boot when at rest with leg supported - per acute chart review   Therapy/Group: Individual Therapy Cruzita Lederer Gean Laursen, PT,  DPT  07/14/2021, 1:28 PM

## 2021-07-14 NOTE — Evaluation (Signed)
Occupational Therapy Assessment and Plan  Patient Details  Name: Courtney Berg MRN: [id number] Date of Birth: 02-20-1956  OT Diagnosis: acute pain, muscle weakness (generalized), and pain in joint Rehab Potential: Rehab Potential (ACUTE ONLY): Good ELOS: 14-16 days   Today's Date: 07/14/2021 OT Individual Time: 1443-1540 OT Individual Time Calculation (min): 60 min     Hospital Problem: Active Problems:   Closed right ankle fracture   Past Medical History:  Past Medical History:  Diagnosis Date   Environmental allergies    pt still getting workup [id number]   Miscarriage    Past Surgical History:  Past Surgical History:  Procedure Laterality Date   DILATION AND CURETTAGE OF UTERUS     post miscarriage   ORIF ANKLE FRACTURE Right 07/01/2021   Procedure: OPEN REDUCTION INTERNAL FIXATION (ORIF) ANKLE FRACTURE;  Surgeon: Shona Needles, MD;  Location: [place];  Service: Orthopedics;  Laterality: Right;   ORIF PATELLA Left 07/01/2021   Procedure: OPEN REDUCTION INTERNAL (ORIF) FIXATION PATELLA;  Surgeon: Shona Needles, MD;  Location: [place];  Service: Orthopedics;  Laterality: Left;   TONSILLECTOMY     WISDOM TOOTH EXTRACTION      Assessment & Plan Clinical Impression: Patient is a 65 y.o. year old female who was admitted on 06/26/2021 after tripping and falling off 3 stairs.  She sustained a right bimalleolar ankle fracture and left patella fracture which was splinted and trauma Ortho consulted for input.  She was taken to the OR for ORIF right bimalleolar ankle fracture with repair of ankle syndesmosis and ORIF left patella on 08/10 by Dr. Doreatha Martin.  Postop to be WBAT on LLE with knee locked in extension and NWB RLE with Lovenox for DVT prophylaxis.  Bledsoe brace to be worn at all times and CAM boot on RLE when [facility] course significant for issues with urinary retention requiring in and out catheterizations, hypotension, constipation, mild hyponatremia as well as  stress-induced hyperglycemia.  Midodrine added for blood pressure support with improvement in symptoms.  She has had some issues with leukocytosis as well as redness surrounding patient --blood cultures x2 done 08/16 which were negative. She was started on septra by ortho for 5 day course to complete 08/23.  Pain is controlled and she continues to have diarrhea after meals. Therapy ongoing but patient limited by dizziness with orthostatic changes and weakness affecting ADLs and mobility . CIR recommended due to functional decline. Her husband is an Programme researcher, broadcasting/film/video and has already set up a lot of equipment for her at home.Patient transferred to CIR on 07/13/2021 .    Patient currently requires  min-total  with basic self-care skills secondary to muscle weakness and WB restrictions, decreased cardiorespiratoy endurance and orthostatic hypotension, and decreased standing balance and difficulty maintaining precautions.  Prior to hospitalization, patient could complete ADLs with independent .  Patient will benefit from skilled intervention to decrease level of assist with basic self-care skills prior to discharge home with care partner.  Anticipate patient will require intermittent supervision and follow up home health.  OT - End of Session Activity Tolerance: Tolerates 30+ min activity with multiple rests Endurance Deficit: Yes Endurance Deficit Description: orthostatic hyptotension limiting activity OT Assessment Rehab Potential (ACUTE ONLY): Good OT Barriers to Discharge: Weight bearing restrictions OT Barriers to Discharge Comments: NWB RLE, WBAT LLE OT Patient demonstrates impairments in the following area(s): Balance;Endurance;Motor;Pain;Safety OT Basic ADL's Functional Problem(s): Grooming;Bathing;Dressing;Toileting OT Transfers Functional Problem(s): Toilet;Tub/Shower OT Additional Impairment(s): None OT Plan OT  Intensity: Minimum of 1-2 x/day, 45 to 90 minutes OT Frequency: 5 out of 7 days OT  Duration/Estimated Length of Stay: 14-16 days OT Treatment/Interventions: Balance/vestibular training;Discharge planning;DME/adaptive equipment instruction;Functional mobility training;Pain management;Patient/family education;Psychosocial support;Self Care/advanced ADL retraining;Skin care/wound managment;Splinting/orthotics;Therapeutic Activities;Therapeutic Exercise;UE/LE Strength taining/ROM;Wheelchair propulsion/positioning OT Basic Self-Care Anticipated Outcome(s): Supervision OT Toileting Anticipated Outcome(s): Supervision OT Bathroom Transfers Anticipated Outcome(s): Supervision OT Recommendation Patient destination: Home Follow Up Recommendations: Home health OT Equipment Recommended: To be determined Equipment Details: pt has most equipment already   OT Evaluation Precautions/Restrictions  Precautions Precautions: Fall Precaution Comments: orthostatic hypotension Required Braces or Orthoses: Other Brace;Knee Immobilizer - Left (CAM boot on RLE when OOB) Knee Immobilizer - Left: On at all times (L knee locked in extension) Other Brace: CAM walker boot RLE-when OOB- can be off in bed. Restrictions Weight Bearing Restrictions: Yes RLE Weight Bearing: Non weight bearing LLE Weight Bearing: Weight bearing as tolerated Other Position/Activity Restrictions: pt is allowed gentle ROM to R ankle out of CAM boot when at rest with leg supported - per acute chart review Pain Pain Assessment Pain Scale: 0-10 Pain Score: 0-No pain Home Living/Prior Functioning Home Living Living Arrangements: Spouse/significant other Available Help at Discharge: Family, Friend(s), Available 24 hours/day Type of Home: House Home Access: Stairs to enter, Electrical engineer of Steps: 4 Entrance Stairs-Rails: Right, Left Home Layout: One level Bathroom Shower/Tub: Tub/shower unit, Multimedia programmer: Standard Bathroom Accessibility: Yes Additional Comments: plan to use  walk-in shower, has 2 shower chairs, BSC  Lives With: Spouse IADL History Homemaking Responsibilities: Yes Meal Prep Responsibility: Primary Laundry Responsibility: Primary Cleaning Responsibility: Primary Shopping Responsibility: Primary Prior Function Level of Independence: Independent with basic ADLs, Independent with transfers, Independent with gait, Independent with homemaking with ambulation  Able to Take Stairs?: Yes Driving: Yes Vocation: Full time employment 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 (can do from home), owns horses, still riding when able Vision Baseline Vision/History: 1 Wears glasses Wears Glasses: Distance only Ability to See in Adequate Light: 1 Impaired (can with glasses, however does not have glasses present) Patient Visual Report: No change from baseline Vision Assessment?: No apparent visual deficits Perception  Perception: Within Functional Limits Praxis Praxis: Intact Cognition Overall Cognitive Status: Within Functional Limits for tasks assessed Arousal/Alertness: Awake/alert Orientation Level: Person;Place;Situation Person: Oriented Place: Oriented Situation: Oriented Year: 2022 Month: August Day of Week: Correct Memory: Appears intact Immediate Memory Recall: Sock;Blue;Bed Memory Recall Sock: Without Cue Memory Recall Blue: Without Cue Memory Recall Bed: Without Cue Attention: Selective Selective Attention: Appears intact Awareness: Appears intact Problem Solving: Appears intact Safety/Judgment: Appears intact Sensation Sensation Light Touch: Not tested (NWB RLE, LLE locked in extension w/brace) Coordination Gross Motor Movements are Fluid and Coordinated: No Coordination and Movement Description: NWB RLE, WBAT LLE locked in extension, orthostasis Finger Nose Finger Test: [facility] Heel Shin Test: NT Motor  Motor Motor: Abnormal postural  alignment and control Motor - Skilled Clinical Observations: NWB RLE, WBAT LLE locked in extension, unable to weight bear 2/2 orthostasis  Trunk/Postural Assessment  Cervical Assessment Cervical Assessment: Exceptions to [facility] (Forward head) Thoracic Assessment Thoracic Assessment: Exceptions to [facility] (Kyphotic) Lumbar Assessment Lumbar Assessment: Exceptions to [facility] (Posterior pelvic tilt) Postural Control Postural Control: Deficits on evaluation Protective Responses: NWB RLE, LLE locked in extension, orthostasis  Balance Balance Balance Assessed: Yes Static Sitting Balance Static Sitting - Balance Support: Feet supported;Bilateral upper extremity supported Static Sitting - Level of Assistance:  5: Stand by assistance Dynamic Sitting Balance Dynamic Sitting - Balance Support: Feet supported Dynamic Sitting - Level of Assistance: 5: Stand by assistance Static Standing Balance Static Standing - Balance Support: During functional activity;Bilateral upper extremity supported Static Standing - Level of Assistance: 4: Min assist Dynamic Standing Balance Dynamic Standing - Balance Support: Bilateral upper extremity supported;During functional activity Dynamic Standing - Level of Assistance: 4: Min assist Dynamic Standing - Balance Activities: Lateral lean/weight shifting;Forward lean/weight shifting Extremity/Trunk Assessment RUE Assessment RUE Assessment: Within Functional Limits LUE Assessment LUE Assessment: Within Functional Limits  Care Tool Care Tool Self Care Eating        Oral Care         Bathing   Body parts bathed by patient: Right arm;Left arm;Chest;Abdomen;Front perineal area;Face Body parts bathed by helper: Buttocks Body parts n/a: Left upper leg;Right lower leg;Left lower leg (wrapped due to bracing) Assist Level: Moderate Assistance - Patient 50 - 74%    Upper Body Dressing(including orthotics)   What is the patient wearing?: Pull over shirt   Assist Level:  Minimal Assistance - Patient > 75%    Lower Body Dressing (excluding footwear)          Putting on/Taking off footwear   What is the patient wearing?:  (knee brace and CAM boot on upon arrival and on during session)         Care Tool Toileting Toileting activity         Care Tool Bed Mobility Roll left and right activity        Sit to lying activity   Sit to lying assist level: Moderate Assistance - Patient 50 - 74%    Lying to sitting on side of bed activity   Lying to sitting on side of bed assist level: the ability to move from lying on the back to sitting on the side of the bed with no back support.: Minimal Assistance - Patient > 75%     Care Tool Transfers Sit to stand transfer        Chair/bed transfer   Chair/bed transfer assist level: Minimal Assistance - Patient > 75%     Toilet transfer   Assist Level: Minimal Assistance - Patient > 75%     Care Tool Cognition  Expression of Ideas and Wants Expression of Ideas and Wants: 4. Without difficulty (complex and basic) - expresses complex messages without difficulty and with speech that is clear and easy to understand  Understanding Verbal and Non-Verbal Content Understanding Verbal and Non-Verbal Content: 4. Understands (complex and basic) - clear comprehension without cues or repetitions   Memory/Recall Ability Memory/Recall Ability : Current season;That he or she is in a hospital/hospital unit   Refer to Care Plan for [place] [facility] OT Short Term Goal 1 (Week 1): Pt will complete LB dressing with mod assist with AE as needed OT Short Term Goal 2 (Week 1): Pt will complete squat pivot transfers with min assist without change in BP OT Short Term Goal 3 (Week 1): Pt will complete sit > stand with min assist to prepare for LB dressing at sit > stand level  Recommendations for other services: None    Skilled Therapeutic Intervention OT eval completed with discussion of rehab process,  OT purpose, POC, ELOS, and goals.  ADL assessment completed with focus on functional transfers and self-care retraining.  Pt received upright in bed expressing desire to shower.  Therapist voiced initial concerns due  to orthostatic hypotension during PT session.  Pt reports no signs/symptoms in sitting just when standing.  Therapist obtained roll in shower chair.  Pt completed bed mobility min-mod assist and completed lateral scoot to roll in shower chair with min assist.  Therapist covered B braces with plastic to ensure remaining dry. Pt completed bathing from roll in shower chair in room shower washing hair and UB and requiring assistance for washing buttocks  Pt completed UB dressing while seated upright in roll-in shower chair with min assist to pull shirt/gown over back.  Pt completed squat pivot transfer to R with use of bed trapeze with mod assist.  Pt then reporting lightheadedness and BP assessed in sitting EOB [birth year].  Therapist educated pt on importance of breathing during transfers and effortful activities to improve blood flow.  Pt returned to semi-reclined and left with all needs in reach.   ADL ADL Upper Body Bathing: Setup Where Assessed-Upper Body Bathing: Shower (roll-in shower chair) Lower Body Bathing: Dependent Where Assessed-Lower Body Bathing: Shower Upper Body Dressing: Minimal assistance Where Assessed-Upper Body Dressing: Chair Lower Body Dressing: Unable to assess Toilet Transfer: Minimal assistance Toilet Transfer Method: Sit pivot (lateral scoot) Toilet Transfer Equipment:  (roll in shower chair/commode) Mobility  Bed Mobility Bed Mobility: Rolling Right;Rolling Left;Supine to Sit;Sitting - Scoot to [employer] of Bed;Sit to Supine;Scooting to [facility] [place] Rolling Right: Supervision/verbal cueing Rolling Left: Supervision/Verbal cueing Supine to Sit: Minimal Assistance - Patient > 75% Sitting - Scoot to Edge of Bed: Minimal Assistance - Patient > 75% Sit to Supine: Minimal  Assistance - Patient > 75% Scooting to HOB: Supervision/Verbal Cueing Transfers Sit to Stand: Minimal Assistance - Patient > 75% Stand to Sit: Minimal Assistance - Patient > 75%   Discharge Criteria: Patient will be discharged from OT if patient refuses treatment 3 consecutive times without medical reason, if treatment goals not met, if there is a change in medical status, if patient makes no progress towards goals or if patient is discharged from hospital.  The above assessment, treatment plan, treatment alternatives and goals were discussed and mutually agreed upon: by patient  Simonne Come 07/14/2021, 12:34 PM

## 2021-07-14 NOTE — Progress Notes (Signed)
Inpatient Rehabilitation Care Coordinator Assessment and Plan Patient Details  Name: Courtney Berg MRN: [id number] Date of Birth: 03/31/1956  Today's Date: 07/14/2021  Hospital Problems: Active Problems:   Closed right ankle fracture  Past Medical History:  Past Medical History:  Diagnosis Date   Environmental allergies    pt still getting workup [id number]   Miscarriage    Past Surgical History:  Past Surgical History:  Procedure Laterality Date   DILATION AND CURETTAGE OF UTERUS     post miscarriage   ORIF ANKLE FRACTURE Right 07/01/2021   Procedure: OPEN REDUCTION INTERNAL FIXATION (ORIF) ANKLE FRACTURE;  Surgeon: Shona Needles, MD;  Location: [place];  Service: Orthopedics;  Laterality: Right;   ORIF PATELLA Left 07/01/2021   Procedure: OPEN REDUCTION INTERNAL (ORIF) FIXATION PATELLA;  Surgeon: Shona Needles, MD;  Location: [place];  Service: Orthopedics;  Laterality: Left;   TONSILLECTOMY     WISDOM TOOTH EXTRACTION     Social History:  reports that she quit smoking about 35 years ago. Her smoking use included cigarettes. She started smoking about 42 years ago. She has a 1.75 pack-year smoking history. She has never used smokeless tobacco. She reports that she does not drink alcohol and does not use drugs.  Family / Support Systems Marital Status: Married Patient Roles: Spouse, Parent Spouse/Significant Other: Gwyndolyn Saxon [phone] Children: 49 yo son in [place] Other Supports: Friends and colleagues Anticipated Caregiver: [place] husband Ability/Limitations of Caregiver: Husband is in fairly good health and can provide assistance Caregiver Availability: 24/7 Family Dynamics: Close knit with husband and mother in-law who is involved. 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  Social History Preferred language: English Religion: Non-Denominational Cultural Background: No issues Education: Licensed conveyancer - How often do you need to have someone help you when you read instructions, pamphlets, or other written material from your doctor or pharmacy?: Never Writes: Yes Employment Status: Employed Return to Work Plans: Plans to return when able Public relations account executive Issues: Workers Comp fell at work other wise no Oceanographer: None-according to MD pt is capable of making her own decisions while here   Abuse/Neglect Abuse/Neglect Assessment Can Be Completed: Yes Physical Abuse: Denies Verbal Abuse: Denies Sexual Abuse: Denies Exploitation of patient/patient's resources: Denies Self-Neglect: Denies  Patient response to: Social Isolation - How often do you feel lonely or isolated from those around you?: Never  Emotional Status Pt's affect, behavior and adjustment status: Pt is motivated to get home, hopes her blood pressure cooperates this is the issue that is hinders her. She feels once this is under control she will be able to move forward and make progress to go home. Has always been independent and knows will be again. Recent Psychosocial Issues: healthy prior to admission Psychiatric History: No history deferred depression screen due to coping appropriatley and able to verbalize her concerns and feelings Substance Abuse History: No issues  Patient / Family Perceptions, Expectations & Goals Pt/Family understanding of illness & functional limitations: Pt and husband can explain her injuries and WB issues. She talks with the MD daily and is hopeful her BP will get under control so she can participate in therapies Premorbid pt/family roles/activities: Wife, Mom, employee, church member, etc Anticipated changes in roles/activities/participation: resume Pt/family expectations/goals: Pt states: " I am hopeful to get home as soon as my BP gets better."  Husband states: " I will do what she needs me to  do."  [employer]: None Premorbid Home  Care/DME Agencies: Other (Comment) (has all equipment from father in-law who was a paraplegic) Transportation available at discharge: Husband pt drove PTA Is the patient able to respond to transportation needs?: Yes In the past 12 months, has lack of transportation kept you from medical appointments or from getting medications?: No In the past 12 months, has lack of transportation kept you from meetings, work, or from getting things needed for daily living?: No  Discharge Planning Living Arrangements: Spouse/significant other Support Systems: Spouse/significant other, Children, Other relatives, Friends/neighbors Type of Residence: Private residence Insurance Resources: Multimedia programmer (specify) (Workers Comp) Pensions consultant: Employment, Secondary school teacher Screen Referred: No Living Expenses: Own Money Management: Patient, Spouse Does the patient have any problems obtaining your medications?: No Home Management: both Patient/Family Preliminary Plans: Return home with husband who is able to assist her at discharge. They have all needed equipment from father in-law who was a paraplegic. Has spoken with Workers [employer] CM Care Coordinator Anticipated Follow Up Needs: [place] female who is motivated but having BP issues and this is her limiting factor. Her husband is involved and will assist. 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 Will await team's evaluations and work on discharge needs.  [name] 07/14/2021, 10:43 AM

## 2021-07-14 NOTE — Evaluation (Addendum)
Physical Therapy Assessment and Plan  Patient Details  Name: Courtney Berg MRN: [id number] Date of Birth: 1956/04/28  PT Diagnosis: Abnormality of gait, Difficulty walking, Muscle weakness, and Pain in joint Rehab Potential: Good ELOS: 14-16 days   Today's Date: 07/14/2021 PT Individual Time: 0800-0918 PT Individual Time Calculation (min): 78 min    Hospital Problem: Active Problems:   Closed right ankle fracture   Past Medical History:  Past Medical History:  Diagnosis Date   Environmental allergies    pt still getting workup OF:[id number]   Miscarriage    Past Surgical History:  Past Surgical History:  Procedure Laterality Date   DILATION AND CURETTAGE OF UTERUS     post miscarriage   ORIF ANKLE FRACTURE Right 07/01/2021   Procedure: OPEN REDUCTION INTERNAL FIXATION (ORIF) ANKLE FRACTURE;  Surgeon: Shona Needles, MD;  Location: [place];  Service: Orthopedics;  Laterality: Right;   ORIF PATELLA Left 07/01/2021   Procedure: OPEN REDUCTION INTERNAL (ORIF) FIXATION PATELLA;  Surgeon: Shona Needles, MD;  Location: [facility];  Service: Orthopedics;  Laterality: Left;   TONSILLECTOMY     WISDOM TOOTH EXTRACTION      Assessment & Plan Clinical Impression: Patient is a 65 y.o. year old female with  relatively good health who was admitted on 06/26/2021 after tripping and falling off 3 stairs.  She sustained a right bimalleolar ankle fracture and left patella fracture which was splinted and trauma Ortho consulted for input.  She was taken to the OR for ORIF right bimalleolar ankle fracture with repair of ankle syndesmosis and ORIF left patella on 08/10 by Dr. Doreatha Martin.  Postop to be WBAT on LLE with knee locked in extension and NWB RLE with Lovenox for DVT prophylaxis.  Bledsoe brace to be worn at all times and CAM boot on RLE when [facility] course significant for issues with urinary retention requiring in and out catheterizations, hypotension, constipation, mild hyponatremia as well  as stress-induced hyperglycemia.  Midodrine added for blood pressure support with improvement in symptoms.  She has had some issues with leukocytosis as well as redness surrounding patient --blood cultures x2 done 08/16 which were negative. She was started on septra by ortho for 5 day course to complete 08/23.  Pain is controlled and she continues to have diarrhea after meals. Therapy ongoing but patient limited by dizziness with orthostatic changes and weakness affecting ADLs and mobility . CIR recommended due to functional decline. Her husband is an Programme researcher, broadcasting/film/video and has already set up a lot of equipment for her at home.  Patient currently requires min with mobility secondary to muscle weakness and muscle joint tightness, orthostasis, and decreased standing balance, decreased postural control, decreased balance strategies, and difficulty maintaining precautions.  Prior to hospitalization, patient was independent  with mobility and lived with Spouse (Husband) in a   home.  Home access is 4Stairs to enter, Ramped entrance.  Patient will benefit from skilled PT intervention to maximize safe functional mobility, minimize fall risk, and decrease caregiver burden for planned discharge home with 24 hour assist.  Anticipate patient will benefit from follow up [facility] at discharge.  PT - End of Session Activity Tolerance: Tolerates 30+ min activity with multiple rests Endurance Deficit: Yes Endurance Deficit Description: orthostatic hyptotension limiting activity PT Assessment Rehab Potential (ACUTE/IP ONLY): Good PT Barriers to Discharge: Weight bearing restrictions;Wound Care PT Barriers to Discharge Comments: NWB RLE, LLE WBAT locked in extension PT Patient demonstrates impairments in the following area(s): Balance;Endurance;Motor;Safety;Skin  Integrity PT Transfers Functional Problem(s): Bed Mobility;Bed to Chair;Car PT Locomotion Functional Problem(s): Ambulation;Wheelchair Mobility;Stairs PT Plan PT  Intensity: Minimum of 1-2 x/day ,45 to 90 minutes PT Frequency: 5 out of 7 days PT Duration Estimated Length of Stay: 14-16 days PT Treatment/Interventions: Ambulation/gait training;Community reintegration;DME/adaptive equipment instruction;Neuromuscular re-education;Psychosocial support;Stair training;UE/LE Strength taining/ROM;Wheelchair propulsion/positioning;Balance/vestibular training;Discharge planning;Pain management;Skin care/wound management;Therapeutic Activities;UE/LE Coordination activities;Cognitive remediation/compensation;Functional mobility training;Patient/family education;Therapeutic Exercise PT Transfers Anticipated Outcome(s): Supervision PT Locomotion Anticipated Outcome(s): Min A PT Recommendation Recommendations for Other Services: Therapeutic Recreation consult Therapeutic Recreation Interventions: Pet therapy;Stress management Follow Up Recommendations: Home health PT Patient destination: Home Equipment Recommended: To be determined   PT Evaluation Precautions/Restrictions Precautions Precautions: Fall Required Braces or Orthoses: Other Brace (L knee brace locked in extension) Knee Immobilizer - Left: On at all times Restrictions Weight Bearing Restrictions: Yes RLE Weight Bearing: Non weight bearing LLE Weight Bearing: Weight bearing as tolerated Pain Interference Pain Interference Pain Effect on Sleep: 1. Rarely or not at all Pain Interference with Therapy Activities: 1. Rarely or not at all Pain Interference with Day-to-Day Activities: 2. Occasionally Home Living/Prior Functioning Home Living Available Help at Discharge: Family;Friend(s);Available 24 hours/day Home Access: Stairs to enter;Ramped entrance Entrance Stairs-Number of Steps: 4 Entrance Stairs-Rails: Right;Left Home Layout: One level Bathroom Shower/Tub: Tub/shower unit;Walk-in shower Bathroom Toilet: Standard Bathroom Accessibility: Yes  Lives With: Spouse (Husband) Prior Function Level  of Independence: Independent with basic ADLs;Independent with transfers;Independent with gait  Able to Take Stairs?: Yes Driving: Yes Vocation: Full time employment Vocation Requirements: Clinical cytogeneticist Comments: Independent, working full time as Therapist, sports for [employer]. (can do from home), owns horses, still riding when able Vision Baseline Vision/History: 1 Wears glasses Wears Glasses: Distance only Ability to See in Adequate Light: 1 Impaired (can with glasses, however does not have glasses present) Patient Visual Report: No change from baseline Vision Assessment?: No apparent visual deficits Perception  Perception: Within Functional Limits Praxis Praxis: Intact Cognition Overall Cognitive Status: Within Functional Limits for tasks assessed Arousal/Alertness: Awake/alert Orientation Level: Oriented X4 Safety/Judgment: Appears intact Sensation Sensation Light Touch: Not tested (NWB RLE, LLE locked in extension w/brace) Coordination Gross Motor Movements are Fluid and Coordinated: No Coordination and Movement Description: NWB RLE, WBAT LLE locked in extension, orthostasis Finger Nose Finger Test: [facility] Heel Shin Test: NT Motor  Motor Motor: Abnormal postural alignment and control Motor - Skilled Clinical Observations: NWB RLE, WBAT LLE locked in extension, unable to weight bear 2/2 orthostasis  Trunk/Postural Assessment  Cervical Assessment Cervical Assessment: Exceptions to [facility] (Forward head) Thoracic Assessment Thoracic Assessment: Exceptions to [facility] (Kyphotic) Lumbar Assessment Lumbar Assessment: Exceptions to [facility] (Posterior pelvic tilt) Postural Control Postural Control: Deficits on evaluation Protective Responses: NWB RLE, LLE locked in extension, orthostasis  Balance Balance Balance Assessed: Yes Static Sitting Balance Static Sitting - Balance Support: Feet supported;Bilateral upper extremity supported Static Sitting - Level of Assistance: 5: Stand by  assistance Dynamic Sitting Balance Dynamic Sitting - Balance Support: Feet supported Dynamic Sitting - Level of Assistance: 5: Stand by assistance Static Standing Balance Static Standing - Balance Support: During functional activity;Bilateral upper extremity supported Static Standing - Level of Assistance: 4: Min assist Dynamic Standing Balance Dynamic Standing - Balance Support: Bilateral upper extremity supported;During functional activity Dynamic Standing - Level of Assistance: 4: Min assist Dynamic Standing - Balance Activities: Lateral lean/weight shifting;Forward lean/weight shifting Extremity Assessment  RLE Assessment RLE Assessment: Not tested (DVT, NWB) LLE Assessment LLE Assessment: Not tested (Pain, locked in extension, WBAT)  Care Tool Care  Tool Bed Mobility Roll left and right activity   Roll left and right assist level: Supervision/Verbal cueing Roll left and right assistive device comment: Bedrails  Sit to lying activity   Sit to lying assist level: Minimal Assistance - Patient > 75% (Used gait belt to manage LLE)    Lying to sitting on side of bed activity   Lying to sitting on side of bed assist level: the ability to move from lying on the back to sitting on the side of the bed with no back support.: Minimal Assistance - Patient > 75% (Used gait belt to move LLE)     Care Tool Transfers Sit to stand transfer   Sit to stand assist level: Minimal Assistance - Patient > 75% (Elevated bed, RW)    Chair/bed transfer Chair/bed transfer activity did not occur: Safety/medical concerns (NWB RLE, orthostasis)       Psychologist, counselling transfer activity did not occur: Safety/medical concerns (NWB RLE, orthostasis)        Care Tool Locomotion Ambulation Ambulation activity did not occur: Safety/medical concerns (NWB RLE, orthostasis)        Walk 10 feet activity Walk 10 feet activity did not occur: Safety/medical concerns (NWB RLE,  orthostasis)       Walk 50 feet with 2 turns activity Walk 50 feet with 2 turns activity did not occur: Safety/medical concerns (NWB RLE, orthostasis)      Walk 150 feet activity Walk 150 feet activity did not occur: Safety/medical concerns (NWB RLE, orthostasis)      Walk 10 feet on uneven surfaces activity Walk 10 feet on uneven surfaces activity did not occur: Safety/medical concerns (NWB RLE, orthostasis)      Stairs Stair activity did not occur: Safety/medical concerns (NWB RLE, orthostasis)        Walk up/down 1 step activity Walk up/down 1 step or curb (drop down) activity did not occur: Safety/medical concerns (NWB RLE, orthostasis)     Walk up/down 4 steps activity did not occuR: Safety/medical concerns (NWB RLE, orthostasis)  Walk up/down 4 steps activity      Walk up/down 12 steps activity Walk up/down 12 steps activity did not occur: Safety/medical concerns (NWB RLE, orthostasis)      Pick up small objects from floor Pick up small object from the floor (from standing position) activity did not occur: Safety/medical concerns (NWB RLE, orthostasis)      Wheelchair Is the patient using a wheelchair?: Yes Type of Wheelchair: Manual Wheelchair activity did not occur: Safety/medical concerns (NWB RLE, orthostasis,  unable to transfer)      Wheel 50 feet with 2 turns activity Wheelchair 50 feet with 2 turns activity did not occur: Safety/medical concerns (NWB RLE, orthostasis, unable to transfer)    Wheel 150 feet activity Wheelchair 150 feet activity did not occur: Safety/medical concerns (NWB RLE, orthostasis, unable to transfer)      Refer to Care Plan for Long Term Goals  SHORT TERM GOAL WEEK 1 PT Short Term Goal 1 (Week 1): Pt will perform bed mobility w/supervision for improved functional mobility PT Short Term Goal 2 (Week 1): Pt will perform sit <>stand w/LRAD and CGA while maintaining WB precautions PT Short Term Goal 3 (Week 1): Pt will perform bed <>chair  transfers w/min A and LRAD  Recommendations for other services: Therapeutic Recreation  Pet therapy and Stress management  Skilled Therapeutic Intervention Evaluation completed (see details above and below) with education  on PT POC,CIR therapy requirement, WB precautions and goals. Pt received supine in bed w/brace on LLE, denied pain but reported nausea following breakfast. Pt able to teach back WB precautions for BLEs and pt educated on importance of PRAFO, cam boot, and ted hose for BP management. Pt rolled L & R w/flat HOB using bedrails w/supervision. Supine <>sit w/min A for LE management, did not use Trapeze bar. At EOB, donned Ted hose on LLE, noted blistering on RLE and did not don ted hose to RLE. Nursing notified. Sit <>supine w/min A for LE management using gait belt on RLE. Supine <>sit using trapeze bar instead of gait belt w/min A, pt reported her nausea increased. At EOB, BP at 135/72. Sit <>stand from elevated bed and min A to RW, BP dropped to 79/51. Nursing notified and abdominal binder ordered. Sit <>supine using trapeze bar and min A for LE management. Pt able to scoot to [facility] using trapeze bar and bedrails and supervision. Pt was left supine in bed, all needs in reach. Safety plan updated.   Mobility Bed Mobility Bed Mobility: Rolling Right;Rolling Left;Supine to Sit;Sitting - Scoot to [employer] of Bed;Sit to Supine;Scooting to [facility] Rolling Right: Supervision/verbal cueing Rolling Left: Supervision/Verbal cueing Supine to Sit: Minimal Assistance - Patient > 75% Sitting - Scoot to Edge of Bed: Minimal Assistance - Patient > 75% Sit to Supine: Minimal Assistance - Patient > 75% Scooting to HOB: Supervision/Verbal Cueing Transfers Transfers: Sit to Stand;Stand to Sit Sit to Stand: Minimal Assistance - Patient > 75% Stand to Sit: Minimal Assistance - Patient > 75% Transfer (Assistive device): Rolling walker Locomotion  Gait Ambulation: No Gait Gait: No Stairs / Additional  Locomotion Stairs: No Wheelchair Mobility Wheelchair Mobility: No   Discharge Criteria: Patient will be discharged from PT if patient refuses treatment 3 consecutive times without medical reason, if treatment goals not met, if there is a change in medical status, if patient makes no progress towards goals or if patient is discharged from hospital.  The above assessment, treatment plan, treatment alternatives and goals were discussed and mutually agreed upon: by patient  Pierrette Scheu E Aidah Forquer 07/14/2021, 9:37 AM

## 2021-07-14 NOTE — Progress Notes (Signed)
[place] Individual Statement of Services  Patient Name:  Courtney Berg  Date:  07/14/2021  Welcome to the [facility].  Our goal is to provide you with an individualized program based on your diagnosis and situation, designed to meet your specific needs.  With this comprehensive rehabilitation program, you will be expected to participate in at least 3 hours of rehabilitation therapies Monday-Friday, with modified therapy programming on the weekends.  Your rehabilitation program will include the following services:  Physical Therapy (PT), Occupational Therapy (OT), 24 hour per day rehabilitation nursing, Care Coordinator, Rehabilitation Medicine, Nutrition Services, and Pharmacy Services  Weekly team conferences will be held on Tuesday to discuss your progress.  Your Inpatient Rehabilitation Care Coordinator will talk with you frequently to get your input and to update you on team discussions.  Team conferences with you and your family in attendance may also be held.  Expected length of stay: 14-16 days  Overall anticipated outcome: supervision-min assist level  Depending on your progress and recovery, your program may change. Your Inpatient Rehabilitation Care Coordinator will coordinate services and will keep you informed of any changes. Your Inpatient Rehabilitation Care Coordinator's name and contact numbers are listed  below.  The following services may also be recommended but are not provided by the [facility] [place] will be made to provide these services after discharge if needed.  Arrangements include referral to agencies that provide these services.  Your insurance has been verified to be:  Workers Comp Your primary doctor is:  Jenna Luo  Pertinent information will be shared with your  doctor and your insurance company.  Inpatient Rehabilitation Care Coordinator:  Ovidio Kin, [place] or Emilia Beck  Information discussed with and copy given to patient by: [name], 07/14/2021, 10:44 AM

## 2021-07-14 NOTE — Progress Notes (Signed)
Patient information reviewed and entered into [facility] System by Becky Draya Felker, PPS coordinator. Information including medical coding, function ability, and quality indicators will be reviewed and updated through discharge.   

## 2021-07-14 NOTE — Progress Notes (Signed)
PROGRESS NOTE   Subjective/Complaints: C/o nausea this morning- discussed NPO Frustrated regarding the braces she has to wear.  Hypotensive- advised use of abdominal binder during therapies   Objective:   DG Abd 1 View  Result Date: 07/13/2021 CLINICAL DATA:  Liquid stool EXAM: ABDOMEN - 1 VIEW COMPARISON:  None. FINDINGS: Scattered gas demonstrated throughout the colon and small bowel. No small or large bowel distention. Changes likely represent ileus. No radiopaque stones. Degenerative changes in the spine and hips. IMPRESSION: Gas-filled nondistended small and large bowel, likely ileus. Electronically Signed   By: Lucienne Capers M.D.   On: 07/13/2021 19:37   Recent Labs    07/14/21 0501  WBC 9.1  HGB 10.4*  HCT 31.7*  PLT 417*   Recent Labs    07/13/21 1811 07/14/21 0501  NA 134* 136  K 3.8 4.4  CL 100 102  CO2 24 27  GLUCOSE 104* 98  BUN 11 11  CREATININE 0.89 0.91  CALCIUM 9.2 9.3   No intake or output data in the 24 hours ending 07/14/21 1137      Physical Exam: Vital Signs Blood pressure 121/61, pulse 74, temperature 98.7 F (37.1 C), resp. rate 16, height '5\' 8"'$  (1.727 m), weight 89.6 kg, SpO2 98 %.  Gen: no distress, normal appearing HEENT: oral mucosa pink and moist, NCAT Cardio: Reg rate Chest: normal effort, normal rate of breathing Abd: soft, non-distended Ext: no edema Psych: pleasant, normal affect Skin: intact Neuro: AOx3 Musculoskeletal:     Comments: Left knee incision C/D/I with sutures in place and mild erythema (decreased per ortho). Right ankle with flat blisters and lateral incision intact with sutures in place. Resolving ecchymosis medially.  Able to DF/PF right foot and flex right hip well, Left lower extremity mobility limited by brace  Assessment/Plan: 1. Functional deficits which require 3+ hours per day of interdisciplinary therapy in a comprehensive inpatient rehab  setting. Physiatrist is providing close team supervision and 24 hour management of active medical problems listed below. Physiatrist and rehab team continue to assess barriers to discharge/monitor patient progress toward functional and medical goals  Care Tool:  Bathing              Bathing assist       Upper Body Dressing/Undressing Upper body dressing        Upper body assist      Lower Body Dressing/Undressing Lower body dressing            Lower body assist Assist for lower body dressing: Set up assist     Toileting Toileting    Toileting assist Assist for toileting: Minimal Assistance - Patient > 75% (lat scoot)     Transfers Chair/bed transfer  Transfers assist  Chair/bed transfer activity did not occur: Safety/medical concerns (NWB RLE, orthostasis)        Locomotion Ambulation   Ambulation assist   Ambulation activity did not occur: Safety/medical concerns (NWB RLE, orthostasis)          Walk 10 feet activity   Assist  Walk 10 feet activity did not occur: Safety/medical concerns (NWB RLE, orthostasis)        Walk 50  feet activity   Assist Walk 50 feet with 2 turns activity did not occur: Safety/medical concerns (NWB RLE, orthostasis)         Walk 150 feet activity   Assist Walk 150 feet activity did not occur: Safety/medical concerns (NWB RLE, orthostasis)         Walk 10 feet on uneven surface  activity   Assist Walk 10 feet on uneven surfaces activity did not occur: Safety/medical concerns (NWB RLE, orthostasis)         Wheelchair     Assist Is the patient using a wheelchair?: Yes Type of Wheelchair: Manual Wheelchair activity did not occur: Safety/medical concerns (NWB RLE, orthostasis,  unable to transfer)         Wheelchair 50 feet with 2 turns activity    Assist    Wheelchair 50 feet with 2 turns activity did not occur: Safety/medical concerns (NWB RLE, orthostasis, unable to  transfer)       Wheelchair 150 feet activity     Assist  Wheelchair 150 feet activity did not occur: Safety/medical concerns (NWB RLE, orthostasis, unable to transfer)       Blood pressure 121/61, pulse 74, temperature 98.7 F (37.1 C), resp. rate 16, height '5\' 8"'$  (1.727 m), weight 89.6 kg, SpO2 98 %.    Medical Problem List and Plan: 1.  Polytrauma with multiple fractures             -patient may shower but all incisions must be covered             -ELOS/Goals: 2 weeks MinA  -Admit to CIR 2.  Bilateral lower extremity DVTs: star Eliquis- bed rest until 8/25. 3. Post-operative pain: controlled without narcotics at this time. Continue Tylenol PRN. LFTs reviewed and stable.  4. Mood: LCSW to follow for evaluation and support.              -antipsychotic agents: N/A 5. Neuropsych: This patient is capable of making decisions on her own behalf. 6. Skin/Wound Care: Monitor wound for healing.  7. Fluids/Electrolytes/Nutrition: Monitor I/O. Check lytes in am.  8.  Right ankle bimalleolar Fx s/p ORIF:  NWB RLE--CAM boot on when OOB.  9. Left patella Fx: WBAT with Timmothy Sours Joy brace locked in full extension.              --may remove brace in shower as long as knee kept in extension and brace replaced afterwards.   10. Acute blood loss anemia: Discussed with patient that her Hgb is improving 11. Diarrhea: Has been refusing laxatives since 08/19-->change to prn. --Likely due to antibiotics as well as food sensitivities. Will add fiber for bulking.  12. Orthostatic hypotension: Monitor BP tid as well as orthostatic vitals--continues to have dizziness. Abdominal binder ordered -- Continue midodrine for BP support.  13.Cellulitis right knee/ankle?: Resolving with Septra DS--end date 08/23.             --Leucocytosis stable around 10 range.              --Ortho to follow up Wed for eval/suture removal.  14. Ileus: NPO until breakfast at which point we will decide treatment based on symptoms,  start Reglan.     LOS: 1 days A FACE TO FACE EVALUATION WAS PERFORMED  Izora Ribas 07/14/2021, 11:37 AM

## 2021-07-14 NOTE — Progress Notes (Signed)
Bilateral lower extremity venous duplex completed. Preliminary results discussed with Virgilio Belling, RN.  07/14/2021 4:42 PM Kelby Aline., MHA, RVT, RDCS, RDMS

## 2021-07-14 NOTE — Progress Notes (Signed)
Orthopedic Tech Progress Note Patient Details:  Courtney Berg [birth year] [id number]  Dropped off ABDOMINAL BINDER to THERAPY, patient also has PRAFO BOOT as well as CAM WALKER .  Ortho Devices Type of Ortho Device: Abdominal binder Ortho Device/Splint Location: STOMACH   Post Interventions Patient Tolerated: Well Instructions Provided: [facility] [place] 07/14/2021, 9:02 AM

## 2021-07-15 ENCOUNTER — Inpatient Hospital Stay (HOSPITAL_COMMUNITY): Payer: Worker's Compensation

## 2021-07-15 ENCOUNTER — Other Ambulatory Visit (HOSPITAL_COMMUNITY): Payer: Self-pay

## 2021-07-15 MED ORDER — FLUDROCORTISONE ACETATE 0.1 MG PO TABS
0.0500 mg | ORAL_TABLET | Freq: Every day | ORAL | Status: DC
  Administered 2021-07-15 – 2021-07-16 (×2): 0.05 mg via ORAL
  Filled 2021-07-15 (×2): qty 0.5
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
## 2021-07-15 NOTE — TOC Benefit Eligibility Note (Signed)
Patient Teacher, English as a foreign language completed.    The patient is currently admitted and upon discharge could be taking Eliquis 5 mg.  The current 30 day co-pay is, $35.00.   The patient is insured through [employer], [place] Patient Advocate Specialist [place] Team Direct Number: [phone]  Fax: [phone]

## 2021-07-15 NOTE — Progress Notes (Signed)
Occupational Therapy Note  Patient Details  Name: Courtney Berg MRN: [id number] Date of Birth: 1956-07-29  Today's Date: 07/15/2021 OT Missed Time: 79 Minutes Missed Time Reason: Patient on bedrest  Pt on active bedrest. 30 min OT missed.    Curtis Sites 07/15/2021, 2:16 PM

## 2021-07-15 NOTE — Progress Notes (Signed)
Physical Therapy Session Note  Patient Details  Name: Courtney Berg MRN: [id number] Date of Birth: 1956/06/09  Today's Date: 07/15/2021 PT Individual Time:  -  PT Amount of Missed Time (min): 60 Minutes PT Missed Treatment Reason: MD hold (Comment);Other (Comment) (Strict bedrest orders per MD)  Skilled Therapeutic Interventions/Progress Updates:  Pt on active bedrest orders. Pt missed 60 Minutes of Physical Therapy.   Therapy Documentation Precautions:  Precautions Precautions: Fall Precaution Comments: orthostatic hypotension Required Braces or Orthoses: Other Brace, Knee Immobilizer - Left (CAM boot on RLE when OOB) Knee Immobilizer - Left: On at all times (L knee locked in extension) Other Brace: CAM walker boot RLE-when OOB- can be off in bed. Restrictions Weight Bearing Restrictions: Yes RLE Weight Bearing: Non weight bearing LLE Weight Bearing: Weight bearing as tolerated Other Position/Activity Restrictions: pt is allowed gentle ROM to R ankle out of CAM boot when at rest with leg supported - per acute chart review General: PT Amount of Missed Time (min): 60 Minutes PT Missed Treatment Reason: MD hold (Comment);Other (Comment) (Strict bedrest orders per MD)    Therapy/Group: Individual Therapy  Zenas Santa, SPT 07/15/2021, 6:39 PM

## 2021-07-15 NOTE — Progress Notes (Signed)
Occupational Therapy Session Note  Patient Details  Name: Courtney Berg MRN: [id number] Date of Birth: 1956/10/04  Today's Date: 07/15/2021 OT Individual Time: 1406-1430 OT Individual Time Calculation (min): 24 min  and Today's Date: 07/15/2021 OT Missed Time: 36 Minutes Missed Time Reason: Patient on bedrest   Short Term Goals: Week 1:  OT Short Term Goal 1 (Week 1): Pt will complete LB dressing with mod assist with AE as needed OT Short Term Goal 2 (Week 1): Pt will complete squat pivot transfers with min assist without change in BP OT Short Term Goal 3 (Week 1): Pt will complete sit > stand with min assist to prepare for LB dressing at sit > stand level  Skilled Therapeutic Interventions/Progress Updates:  Pt greeted supine in bed, session continues to be limited by bed rest restrictions however PA approved  bed level BUE therex. Pt completed therex as indicated below with level 1 theraband: X10 reps shoulder flexion  X10 reps bicep curls X10 reps punches X10 reps tricep extension  X10 reps chest pulls.   Per recommendation of PA, additionally utilized time to provided education on orthostatic hypotension,  s/s of OH, and tips to manage OH. Provided handout to increase carryover with pt verbalizing understanding. Pt left supine in bed with bed alarm activated and all needs within reach.   Therapy Documentation Precautions:  Precautions Precautions: Fall Precaution Comments: orthostatic hypotension Required Braces or Orthoses: Other Brace, Knee Immobilizer - Left (CAM boot on RLE when OOB) Knee Immobilizer - Left: On at all times (L knee locked in extension) Other Brace: CAM walker boot RLE-when OOB- can be off in bed. Restrictions Weight Bearing Restrictions: Yes RLE Weight Bearing: Non weight bearing LLE Weight Bearing: Weight bearing as tolerated Other Position/Activity Restrictions: pt is allowed gentle ROM to R ankle out of CAM boot when at rest with leg supported - per  acute chart review General: General OT Amount of Missed Time: 36 Minutes  Pt reports no pain during session.    Therapy/Group: Individual Therapy  Corinne Ports [facility] 07/15/2021, 4:09 PM

## 2021-07-15 NOTE — Progress Notes (Signed)
Occupational Therapy Session Note  Patient Details  Name: Courtney Berg MRN: [id number] Date of Birth: August 31, 1956  Today's Date: 07/15/2021 OT Individual Time: 1020-1047 OT Individual Time Calculation (min): 27 min  and Today's Date: 07/15/2021 OT Missed Time: 33 Minutes Missed Time Reason: Patient on bedrest   Short Term Goals: Week 1:  OT Short Term Goal 1 (Week 1): Pt will complete LB dressing with mod assist with AE as needed OT Short Term Goal 2 (Week 1): Pt will complete squat pivot transfers with min assist without change in BP OT Short Term Goal 3 (Week 1): Pt will complete sit > stand with min assist to prepare for LB dressing at sit > stand level  Skilled Therapeutic Interventions/Progress Updates:  Pt greeted supine in bed, discussed orders for bed rests d/t blood clots, however PA approved BADL reeducation session from bed level  (UB grooming tasks and toileting from bed level). Pt completed oral care and washing her face with set- up assist. Pt additionally reports need to void bowels, supervision to roll R<>L to transition onto bed pan. MAX A for posterior pericare in sidelying d/t cleanliness. Pt left supine in bed with all needs within reach and bed alarm activated. Pt missed 33 mins of OT session d/t bedrest limitations. Will make up missed minutes as time allows.   Therapy Documentation Precautions:  Precautions Precautions: Fall Precaution Comments: orthostatic hypotension Required Braces or Orthoses: Other Brace, Knee Immobilizer - Left (CAM boot on RLE when OOB) Knee Immobilizer - Left: On at all times (L knee locked in extension) Other Brace: CAM walker boot RLE-when OOB- can be off in bed. Restrictions Weight Bearing Restrictions: Yes RLE Weight Bearing: Non weight bearing LLE Weight Bearing: Weight bearing as tolerated Other Position/Activity Restrictions: pt is allowed gentle ROM to R ankle out of CAM boot when at rest with leg supported - per acute chart  review Pain: Pt reports no pain during session.    Therapy/Group: Individual Therapy  [name] 07/15/2021, 2:53 PM

## 2021-07-16 ENCOUNTER — Inpatient Hospital Stay (HOSPITAL_COMMUNITY): Payer: Self-pay

## 2021-07-16 ENCOUNTER — Inpatient Hospital Stay (HOSPITAL_COMMUNITY): Payer: Worker's Compensation

## 2021-07-16 MED ORDER — FLUDROCORTISONE ACETATE 0.1 MG PO TABS
0.1000 mg | ORAL_TABLET | Freq: Every day | ORAL | Status: DC
  Administered 2021-07-17 – 2021-07-23 (×7): 0.1 mg via ORAL
  Filled 2021-07-16 (×7): qty 1

## 2021-07-16 MED ORDER — MIDODRINE HCL 5 MG PO TABS
10.0000 mg | ORAL_TABLET | Freq: Three times a day (TID) | ORAL | Status: DC
  Administered 2021-07-16 – 2021-07-23 (×21): 10 mg via ORAL
  Filled 2021-07-16 (×21): qty 2

## 2021-07-16 MED ORDER — HYDROCODONE-ACETAMINOPHEN 5-325 MG PO TABS
1.0000 | ORAL_TABLET | ORAL | Status: DC | PRN
  Administered 2021-07-17 – 2021-07-21 (×3): 1 via ORAL
  Filled 2021-07-16 (×4): qty 1
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
## 2021-07-16 NOTE — Discharge Instructions (Addendum)
Inpatient Rehab Discharge Instructions  Courtney Berg Discharge date and time:  07/23/21  Activities/Precautions/ Functional Status: Activity: no lifting, driving, or strenuous exercise till cleared by MD Diet: regular diet Wound Care: keep wound clean and dry. Contact Dr. Doreatha Martin if you develop any problems with your incision/wound--redness, swelling, increase in pain, drainage or if you develop fever or chills.    Functional status:  ___ No restrictions     ___ Walk up steps independently _X__ 24/7 supervision/assistance   ___ Walk up steps with assistance ___ Intermittent supervision/assistance  ___ Bathe/dress independently ___ Walk with walker     _X__ Bathe/dress with assistance ___ Walk Independently    ___ Shower independently ___ Walk with assistance    ___ Shower with assistance _X__ No alcohol     ___ Return to work/school ________    Special Instructions: No weight on right leg--CAM boot when out of bed Keep brace on left knee.    COMMUNITY REFERRALS UPON DISCHARGE:    Home Health:   [facility] [employer] [facility] IS ARRANGING AND WILL HAVE CALL PT AT HOME  Medical Equipment/Items Ordered:BARIATRIC DROP-ARM BEDSIDE COMMODE AND TRANSFER BOARD                                                 Agency/Supplier:[employer] [facility]ED AND WILL BE DELIVERED TO PT'S HOME    My questions have been answered and I understand these instructions. I will adhere to these goals and the provided educational materials after my discharge from the hospital.  Patient/Caregiver Signature _______________________________ Date __________  Clinician Signature _______________________________________ Date __________  Please bring this form and your medication list with you to all your follow-up doctor's appointments.    Information on my medicine - ELIQUIS (apixaban)   Why was Eliquis prescribed for you? Eliquis was prescribed to treat blood clots that may have been found in the veins of your  legs (deep vein thrombosis) or in your lungs (pulmonary embolism) and to reduce the risk of them occurring again.  What do You need to know about Eliquis ? The dose is ONE 5 mg tablet taken TWICE daily.  Eliquis may be taken with or without food.   Try to take the dose about the same time in the morning and in the evening. If you have difficulty swallowing the tablet whole please discuss with your pharmacist how to take the medication safely.  Take Eliquis exactly as prescribed and DO NOT stop taking Eliquis without talking to the doctor who prescribed the medication.  Stopping may increase your risk of developing a new blood clot.  Refill your prescription before you run out.  After discharge, you should have regular check-up appointments with your healthcare provider that is prescribing your Eliquis.    What do you do if you miss a dose? If a dose of ELIQUIS is not taken at the scheduled time, take it as soon as possible on the same day and twice-daily administration should be resumed. The dose should not be doubled to make up for a missed dose.  Important Safety Information A possible side effect of Eliquis is bleeding. You should call your healthcare provider right away if you experience any of the following: Bleeding from an injury or your nose that does not stop. Unusual colored urine (red or dark brown) or unusual colored stools (red  or black). Unusual bruising for unknown reasons. A serious fall or if you hit your head (even if there is no bleeding).  Some medicines may interact with Eliquis and might increase your risk of bleeding or clotting while on Eliquis. To help avoid this, consult your healthcare provider or pharmacist prior to using any new prescription or non-prescription medications, including herbals, vitamins, non-steroidal anti-inflammatory drugs (NSAIDs) and supplements.  This website has more information on Eliquis (apixaban):  http://www.eliquis.com/eliquis/home

## 2021-07-16 NOTE — Progress Notes (Signed)
Physical Therapy Session Note  Patient Details  Name: Courtney Berg MRN: [id number] Date of Birth: [birth year]  Today's Date: 07/16/2021 PT Individual Time: 1000-1040 PT Individual Time Calculation (min): 40 min   Short Term Goals: Week 1:  PT Short Term Goal 1 (Week 1): Pt will perform bed mobility w/supervision for improved functional mobility PT Short Term Goal 2 (Week 1): Pt will perform sit <>stand w/LRAD and CGA while maintaining WB precautions PT Short Term Goal 3 (Week 1): Pt will perform bed <>chair transfers w/min A and LRAD  Skilled Therapeutic Interventions/Progress Updates:   Received pt semi-reclined in bed, pt agreeable to PT treatment, and denied any pain during session and reported feeling much better today and is now off bedrest. Session with emphasis on functional mobility/transfers, generalized strengthening, and improved tolerance to upright positions. R CAM boot donned and L bledsoe brace on and locked into extension. BP semi-reclined in bed at rest: 126/51 -pt asymptomatic. Pt transferred supine<>sitting EOB with HOB elevated and supervision. BP sitting EOB:115/60 - pt reported mild lightheadedness. Sit<>stand with RW and min A from elevated bed with cues to maintain RLE NWB precautions. BP standing: 108/65 - pt reporting increased dizziness and returned to sitting to rest. Then transferred elevated bed<>recliner stand<>pivot with RW and min A. Pt with mild TDWB on RLE rather than NWB. BP: 148/44 sitting in recliner and pt reported feeling "warm" during transfer but symptoms resolved within a few minutes of sitting down. Concluded session with pt sitting in recliner to promote upright positioning with all needs within reach and with no current orthostatic symptoms.   Of note, BP measurements taken without abdominal binder/ted hose on.   Therapy Documentation Precautions:  Precautions Precautions: Fall Precaution Comments: orthostatic hypotension Required Braces or  Orthoses: Other Brace, Knee Immobilizer - Left (CAM boot on RLE when OOB) Knee Immobilizer - Left: On at all times (L knee locked in extension) Other Brace: CAM walker boot RLE-when OOB- can be off in bed. Restrictions Weight Bearing Restrictions: Yes RLE Weight Bearing: Non weight bearing LLE Weight Bearing: Weight bearing as tolerated Other Position/Activity Restrictions: pt is allowed gentle ROM to R ankle out of CAM boot when at rest with leg supported - per acute chart review  Therapy/Group: Individual Therapy Alfonse Alpers PT, DPT   07/16/2021, 7:23 AM

## 2021-07-16 NOTE — Progress Notes (Signed)
Physical Therapy Session Note  Patient Details  Name: Courtney Berg MRN: [id number] Date of Birth: September 26, 1956  Today's Date: 07/16/2021 PT Individual Time: 0801-0903; [id number] PT Individual Time Calculation (min): 62 min and 62 min  Short Term Goals: Week 1:  PT Short Term Goal 1 (Week 1): Pt will perform bed mobility w/supervision for improved functional mobility PT Short Term Goal 2 (Week 1): Pt will perform sit <>stand w/LRAD and CGA while maintaining WB precautions PT Short Term Goal 3 (Week 1): Pt will perform bed <>chair transfers w/min A and LRAD  Skilled Therapeutic Interventions/Progress Updates:  Session 1 Pt received supine in bed, husband present in room. Denied pain but reported discomfort in RLE. Lengthy discussion to educate pt and husband on orthostatic hypotension, WB precautions, and healing process for LLE. Pt no longer on bed rest. Pt rolled L & R and donned shorts w/min A. Supine <>sit EOB w/CGA and use of bedrails. Pt reported she felt lightheaded, checked vitals immediately and again after 5 minutes:   Initial: 116/59 After 5 min: 114/87   Provided education regarding syncope, use of abdominal binder, and diaphragmatic breathing to increase intra-abdominal pressure. Pt verbalized understanding and participated in diaphragmatic breathing for 5 minutes w/intermittent verbal cues for technique. Donned pt's cam boot on RLE w/total A and pt reported continued feelings of lightheadedness. Sit EOB <>supine w/CGA and scoot to [facility] w/min A. Pt was left supine in bed w/all needs in reach.   Session 2 Pt received supine in bed asleep, easy to arouse, w/cam boot on RLE and bledsoe on LLE . Reported pain as 4/10 in R ankle, had received pain meds prior to session. Emphasis of session on improved weightbearing tolerance and transfers. Pt performed bed mobility w/supervision and sat at EOB for 5 minutes, BP read 115/72. Donned abdominal binder w/total A. From elevated EOB, sit  <>stand w/min A to RW and pt ambulated using hop-to pattern 5' to [facility] w/min A. Noted pt pulled up on RW to stand from EOB and had difficulty maintaining precautions. Lengthy education using NDT principles regarding hand placement, shifting COG forward and maintaining NWB of RLE to complete sit <>stand. Mass practice of sit <>stands x5 from [facility] to RW, requiring mod A for anterior weight shift and trunk extension as well as verbal cues for hand placement reminders. Noted pt flopped down during each stand <>sit, multimodal cues provided for a controlled decent. On 5th stand, pt ambulated using hop-to pattern w/RW 5' to bed w/min A. Pt denied significant lightheadedness or dizziness throughout session and tolerated standing well w/binder. Pt performed bed mobility w/supervision and reported 3/10 pain in R ankle. Provided ice pack for pain relief and pt was left supine in bed with all needs in reach.   Therapy Documentation Precautions:  Precautions Precautions: Fall Precaution Comments: orthostatic hypotension Required Braces or Orthoses: Other Brace, Knee Immobilizer - Left (CAM boot on RLE when OOB) Knee Immobilizer - Left: On at all times (L knee locked in extension) Other Brace: CAM walker boot RLE-when OOB- can be off in bed. Restrictions Weight Bearing Restrictions: Yes RLE Weight Bearing: Non weight bearing LLE Weight Bearing: Weight bearing as tolerated Other Position/Activity Restrictions: pt is allowed gentle ROM to R ankle out of CAM boot when at rest with leg supported - per acute chart review    Therapy/Group: Individual Therapy Cruzita Lederer Yorel Redder, PT, DPT  07/16/2021, 7:38 AM

## 2021-07-16 NOTE — Progress Notes (Signed)
Occupational Therapy Session Note  Patient Details  Name: Courtney Berg MRN: [id number] Date of Birth: 1956/06/16  Today's Date: 07/16/2021 OT Individual Time: 1100-1157 OT Individual Time Calculation (min): 57 min    Short Term Goals: Week 1:  OT Short Term Goal 1 (Week 1): Pt will complete LB dressing with mod assist with AE as needed OT Short Term Goal 2 (Week 1): Pt will complete squat pivot transfers with min assist without change in BP OT Short Term Goal 3 (Week 1): Pt will complete sit > stand with min assist to prepare for LB dressing at sit > stand level  Skilled Therapeutic Interventions/Progress Updates:    Pt received seated in recliner, c/o R knee pain post x-ray, RN present to administer pain rx, agreeable to therapy. Session focus on self-care retraining, activity tolerance, func transfers, home set-up/DME in prep for improved ADL/IADL/func mobility performance + decreased caregiver burden. BP in seated read at 131/51 (74), read at 135/68 (84) post activity. Attempted STS X2 from low height recliner, unable to achieve static standing resulting in posterior LOB 2/2 low height. Would benefit from building up the recliner with extra w/c cushions. Slide board transfer > w/c placed on her L with max A to place board, CGA to complete transfer. Self-propelled w/c to and from therapy gym with close S and assist to navigate enter/exiting elevator. Completed 1x10 of the following in prep for improved BUE conditoining to facilitate improved transfer performance: w/c push-ups, upper back rows, biceps curls, core twists, and B punches with level 3 theraband. Completed 1x10 of B leg raises, as well. Slide-board transfer back to bed same manner as before, pt able to return to supine with S without use of bed rails/trapeze bar. Pt showed therapist pictures of DME at home, most likely will need a new 3in1 (will need drop arm) and a new w/c (has power w/c and transport chair). Will cont to discuss  with primary team.   Pt left semi-reclined in bed with bed alarm engaged, call bell in reach, and all immediate needs met.    Therapy Documentation Precautions:  Precautions Precautions: Fall Precaution Comments: orthostatic hypotension Required Braces or Orthoses: Other Brace, Knee Immobilizer - Left (CAM boot on RLE when OOB) Knee Immobilizer - Left: On at all times (L knee locked in extension) Other Brace: CAM walker boot RLE-when OOB- can be off in bed. Restrictions Weight Bearing Restrictions: Yes RLE Weight Bearing: Non weight bearing LLE Weight Bearing: Weight bearing as tolerated Other Position/Activity Restrictions: pt is allowed gentle ROM to R ankle out of CAM boot when at rest with leg supported - per acute chart review  Pain:   R knee pain, did not rate ADL: See Care Tool for more details.  Therapy/Group: Individual Therapy  Volanda Napoleon MS, OTR/L  07/16/2021, 6:48 AM
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
## 2021-07-17 NOTE — Progress Notes (Signed)
Patient ID: Courtney Berg, female   DOB: [birth year], 65 y.o.   MRN: [id number]  Spoke with [employer]-CM-Susan Smernoff to inform DVT's and on bed rest on Wednesday but now off of bedrest and participating in therapies. Aware of team conference Tuesday and will update then regarding discharge needs.

## 2021-07-17 NOTE — Progress Notes (Signed)
Occupational Therapy Session Note  Patient Details  Name: Courtney Berg MRN: [id number] Date of Birth: [birth year]  Today's Date: 07/17/2021 OT Individual Time: 1400-1500 OT Individual Time Calculation (min): 60 min    Short Term Goals: Week 1:  OT Short Term Goal 1 (Week 1): Pt will complete LB dressing with mod assist with AE as needed OT Short Term Goal 2 (Week 1): Pt will complete squat pivot transfers with min assist without change in BP OT Short Term Goal 3 (Week 1): Pt will complete sit > stand with min assist to prepare for LB dressing at sit > stand level  Skilled Therapeutic Interventions/Progress Updates:   Pt received in bed and agreeable to OT. Pt denied signs of orthostatic hypotension. Pt reported stitches removed R LE and aware of degree changes in L KI per ortho PA. Gentle PROM on R ankle along with patient education on importance of ROM for functional movement. Pt received skilled education on wearing PRAFO at night to assist healing & prevent contractures. Completed sliding board transfer bed <> w/c with min A for LE precautions and cuing for weight shift. Pt educated on w/c management including arm rests, locking brakes appropriately and functional mobility techniques. W/c propulsion 100 ft x 2 focusing on UE activity tolerance and strengthening. Worked on Scientist, research (physical sciences) on elevated surface using mat with min A for lateral weight shift, cuing for removal of arm rests, safety brake, and adherence to LE precautions. VC x 2 provided to keep weight off R LE. UE circuit completed to focus on strengthening/activity tolerance for triceps & lats to improve functional transfers when home. Pt tolerated session well, reported no signs of orthostatic hypotension  & no signs of dizziness. Independent propulsion back to room with min cuing for mobility in elevator. Transferred back to bed with sliding board with setup and min VC for hand placement and bed mobility techniques  to maintain precautions. CAM boot removed, pt stated she may try using the PRAFO at night. Pt left in bed with call bell nearby, alarm on & needs met.   Therapy Documentation Precautions:  Precautions Precautions: Fall Precaution Comments: orthostatic hypotension Required Braces or Orthoses: Other Brace, Knee Immobilizer - Left (CAM boot on RLE when OOB) Knee Immobilizer - Left: On at all times (L knee locked in extension) Other Brace: CAM walker boot RLE-when OOB- can be off in bed. Restrictions Weight Bearing Restrictions: Yes RLE Weight Bearing: Non weight bearing LLE Weight Bearing: Weight bearing as tolerated Other Position/Activity Restrictions: pt is allowed gentle ROM to R ankle out of CAM boot when at rest with leg supported - per acute chart review    Therapy/Group: Individual Therapy  Letrice Pollok 07/17/2021, 12:52 PM

## 2021-07-17 NOTE — Progress Notes (Signed)
Orthopaedic Trauma Progress Note  SUBJECTIVE: Doing fairly well today.  Was able to shower which she was excited about. States therapies are going well but she continues to have some occasional dizziness.   OBJECTIVE:  General: Sitting up in bed, NAD Respiratory: No increased work of breathing.  LLE: Hinge brace in place.  Sutures removed. Able to wiggle toes.  Ankle dorsiflexion/plantarflexion intact.  Unlocked brace to 30 degrees. Endorses sensation to light touch throughout the extremity.  Compartments soft and compressible. + DP pulse  RLE: Incisions clean, dry, intact with no drainage. Sutures were removed. Ankle DF/PF intact but stiff. Able to wiggle toes.  Compartments soft and compressible.  Endorses sensation to light touch over all aspects of foot. Foot is warm and well-perfused. +DP pulse  IMAGING: Repeat x-rays left knee, right ankle from 07/16/2021 stable  ASSESSMENT: Courtney Berg is a 65 y.o. female s/p  ORIF RIGHT ANKLE FRACTURE 07/01/21 ORIF LEFT PATELLA 07/01/21  PLAN: Weightbearing: NWB RLE, WBAT LLE with hinge brace locked in full extension ROM:  - RLE: Ok for ankle ROM as tolerated at rest - LLE: Ok for knee flexion up to 30 degrees in barce. Must have brace locked in extension when ambulating Incisional and dressing care:  Ok to leave open to air Showering: Ok to shower with assistance. Incisions may get wet Orthopedic device(s): CAM boot RLE when OOB, Bledsoe brace LLE Pain management: per CIR VTE treatment: Eliquis Impediments to Fracture Healing: Vit D level 19, continue on D3 supplementation. Recommend continuing this at discharge  Dispo: Continue therapies as able  Follow - up plan: We will continue to follow while in hospital. Plan for outpatient follow-up 2 weeks after d/c for repeat x-rays and wound check  Contact information:  Katha Hamming MD, Patrecia Pace PA-C. After hours and holidays please check [email or web address] for group call information for [facility]   Courtney Paget A. Ricci Barker, PA-C [phone] (office) [email or web address]

## 2021-07-17 NOTE — Progress Notes (Signed)
PROGRESS NOTE   Subjective/Complaints: Appetite is improving. Feeling better and more confident that she was able to do at least  3 sessions. She was glad to shower again this am-no orthostatic symptoms.    ROS: Denies pain   Objective:   DG Knee 1-2 Views Left  Result Date: 07/16/2021 CLINICAL DATA:  ORIF patellar fracture. EXAM: LEFT KNEE - 1-2 VIEW COMPARISON:  06/26/2021 FINDINGS: Post sideplate fixation of the patella. Alignment appears near anatomic. No evidence of hardware failure or loosening. There is a minimal amount of expected adjacent soft tissue swelling. Small knee joint effusion. No evidence of lipohemarthrosis. Joint spaces are preserved. IMPRESSION: Post sideplate fixation of the patella without evidence of complication. Electronically Signed   By: Sandi Mariscal M.D.   On: 07/16/2021 09:30   DG Ankle Complete Right  Result Date: 07/16/2021 CLINICAL DATA:  ORIF right ankle fracture. EXAM: RIGHT ANKLE - COMPLETE 3+ VIEW COMPARISON:  07/01/2021; 06/26/2021 FINDINGS: Stable sequela of ORIF of trimalleolar ankle fracture including sideplate fixation of the fibula, suture repair of the syndesmosis and cancellous fixation of posterior malleolus. Alignment appears anatomic. No evidence of hardware failure or loosening. Joint spaces are preserved. The ankle mortise is preserved. Very minimal amount of expected adjacent soft tissue swelling. Moderate to large sized plantar calcaneal spur. IMPRESSION: Post ORIF trimalleolar ankle fracture and suture repair of the syndesmosis without evidence of hardware failure or loosening. Electronically Signed   By: Sandi Mariscal M.D.   On: 07/16/2021 09:29   No results for input(s): WBC, HGB, HCT, PLT in the last 72 hours.  No results for input(s): NA, K, CL, CO2, GLUCOSE, BUN, CREATININE, CALCIUM in the last 72 hours.   Intake/Output Summary (Last 24 hours) at 07/17/2021 1840 Last data filed at  07/17/2021 1335 Gross per 24 hour  Intake 236 ml  Output --  Net 236 ml        Physical Exam: Vital Signs Blood pressure 134/64, pulse 69, temperature 97.8 F (36.6 C), temperature source Oral, resp. rate 17, height '5\' 8"'$  (1.727 m), weight 89.6 kg, SpO2 93 %. Gen: In gym with therapy--abdominal binder on chest. NAD HEENT: AT, [place], EOMI, hearing intact.  Cardio: RRR, no murmurs Chest: CTA without wheezes  Abd: soft, non-distended Ext: Left knee incision with min edema, incision intact without signs of infection and Don Joy brace in place. RLE with CAM boot in place.  Psych: Mood stable. Appropriate and interactive.    Musculoskeletal:     Comments: Left knee incision C/D/I with sutures in place and mild erythema (decreased per ortho). Right ankle with flat blisters and lateral incision intact with sutures in place. Resolving ecchymosis medially.  Able to DF/PF right foot and flex right hip well, Left lower extremity mobility limited by brace  Assessment/Plan: 1. Functional deficits which require 3+ hours per day of interdisciplinary therapy in a comprehensive inpatient rehab setting. Physiatrist is providing close team supervision and 24 hour management of active medical problems listed below. Physiatrist and rehab team continue to assess barriers to discharge/monitor patient progress toward functional and medical goals  Care Tool:  Bathing    Body parts bathed by patient: Right arm,  Left arm, Chest, Abdomen, Front perineal area, Face   Body parts bathed by helper: Buttocks Body parts n/a: Left upper leg, Right lower leg, Left lower leg (wrapped due to bracing)   Bathing assist Assist Level: Moderate Assistance - Patient 50 - 74%     Upper Body Dressing/Undressing Upper body dressing   What is the patient wearing?: Pull over shirt    Upper body assist Assist Level: Minimal Assistance - Patient > 75%    Lower Body Dressing/Undressing Lower body dressing      What is  the patient wearing?: Pants     Lower body assist Assist for lower body dressing: Moderate Assistance - Patient 50 - 74%     Toileting Toileting    Toileting assist Assist for toileting: Moderate Assistance - Patient 50 - 74%     Transfers Chair/bed transfer  Transfers assist  Chair/bed transfer activity did not occur: Safety/medical concerns (NWB RLE, orthostasis)  Chair/bed transfer assist level: Contact Guard/Touching assist (Lateral scoot)     Locomotion Ambulation   Ambulation assist   Ambulation activity did not occur: Safety/medical concerns (NWB RLE, orthostasis)          Walk 10 feet activity   Assist  Walk 10 feet activity did not occur: Safety/medical concerns (NWB RLE, orthostasis)        Walk 50 feet activity   Assist Walk 50 feet with 2 turns activity did not occur: Safety/medical concerns (NWB RLE, orthostasis)         Walk 150 feet activity   Assist Walk 150 feet activity did not occur: Safety/medical concerns (NWB RLE, orthostasis)         Walk 10 feet on uneven surface  activity   Assist Walk 10 feet on uneven surfaces activity did not occur: Safety/medical concerns (NWB RLE, orthostasis)         Wheelchair     Assist Is the patient using a wheelchair?: Yes Type of Wheelchair: Manual Wheelchair activity did not occur: Safety/medical concerns (NWB RLE, orthostasis,  unable to transfer)  Wheelchair assist level: Supervision/Verbal cueing Max wheelchair distance: >150'    Wheelchair 50 feet with 2 turns activity    Assist    Wheelchair 50 feet with 2 turns activity did not occur: Safety/medical concerns (NWB RLE, orthostasis, unable to transfer)   Assist Level: Supervision/Verbal cueing   Wheelchair 150 feet activity     Assist  Wheelchair 150 feet activity did not occur: Safety/medical concerns (NWB RLE, orthostasis, unable to transfer)   Assist Level: Supervision/Verbal cueing   Blood pressure  134/64, pulse 69, temperature 97.8 F (36.6 C), temperature source Oral, resp. rate 17, height '5\' 8"'$  (1.727 m), weight 89.6 kg, SpO2 93 %.    Medical Problem List and Plan: 1.  Polytrauma with multiple fractures             -patient may shower but all incisions must be covered             -ELOS/Goals: 2 weeks MinA  -Continue CIR 2.  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 Post-operative pain:  Decrease '5mg'$  Norco to 1 tab q4 H PRN. -- Continue Tylenol PRN.   4. Mood: LCSW to follow for evaluation and support.              -antipsychotic agents: N/A 5. Neuropsych: This patient is capable of making decisions on her own behalf. 6. Skin/Wound Care: Monitor wounds daily for healing.  7. Fluids/Electrolytes/Nutrition: Monitor I/O.  Check lytes in am.  8.  Right ankle bimalleolar Fx s/p ORIF:  NWB RLE--CAM boot on when OOB.  9. Left patella Fx: WBAT with Timmothy Sours Joy brace locked in full extension.              --may remove brace in shower as long as knee kept in extension and brace replaced afterwards.   10. Acute blood loss anemia: Stable. Will recheck labs on Monday.  11. Anxiety: Improving with ego support.   12. Orthostatic hypotension: Monitor BP tid as well as orthostatic vitals.  --Wearing abdominal binder on chest--advised patient and PT to lower it a bit for better support of BP --Continue midodrine and Florinef for BP support.  --Did drop with fatigue today but recovered nicely with brief rest.  13.Cellulitis right knee/ankle?: Resolved --sutures were removed  by ortho PA today.   14. Ileus: has resolved and is tolerating regular diet.   15. Obesity (BMI 30.03): provide dietary education.     LOS: 4 days A FACE TO FACE EVALUATION WAS PERFORMED  Bary Leriche 07/17/2021, 6:40 PM

## 2021-07-17 NOTE — Progress Notes (Signed)
Attempted to see patient in room and gym but could not find her during morning rounds- requested Pam to check in on her today. Appreciate ortho follow-up

## 2021-07-17 NOTE — Progress Notes (Signed)
Physical Therapy Session Note  Patient Details  Name: Courtney Berg MRN: [id number] Date of Birth: 10-Aug-1956  Today's Date: 07/17/2021 PT Individual Time: 1005-1058 PT Individual Time Calculation (min): 53 min   Short Term Goals: Week 1:  PT Short Term Goal 1 (Week 1): Pt will perform bed mobility w/supervision for improved functional mobility PT Short Term Goal 2 (Week 1): Pt will perform sit <>stand w/LRAD and CGA while maintaining WB precautions PT Short Term Goal 3 (Week 1): Pt will perform bed <>chair transfers w/min A and LRAD  Skilled Therapeutic Interventions/Progress Updates:  Pt received sitting in WC in room w/CAM boot, Bledsoe and abdominal binder donned. Reported pain as 3/10 in R ankle, had received pain meds prior to session. Emphasis of session on maintaining WB precautions during transfers, weightbearing tolerance and gentle ROM of RLE. Pt self-propelled form room to ortho gym w/supervision (>200'). In // bars, sit <>stand w/mod A for anterior weight shift and foot under RLE to maintain NWB. While standing, x10 hip marches and hip extension on RLE to promote weightbearing tolerance of LLE and ROM of RLE. Pt began to feel dizzy, stand <> sit w/mod A for posterior weight shift and hip lowering to WC.  Vitals:  BP immediately after standing: 120/49  BP after 5 min of sitting and diaphragmatic breathing: 112/53  Sit <>stand in // bars w/mod A and pt stood for 60s, verbal cues for diaphragmatic breathing and cinched abdominal binder. Pt denied feelings of orthostasis, min A for stand <>sit. Pt self-propelled from gym back to room (>150') w/supervision and performed lateral scoot transfer to R side from [facility] to bed w/CGA. Pt performed bed mobility w/supervision and was left supine in bed w/all needs in reach, reported pain as 1/10 in R ankle.   Therapy Documentation Precautions:  Precautions Precautions: Fall Precaution Comments: orthostatic hypotension Required Braces or  Orthoses: Other Brace, Knee Immobilizer - Left (CAM boot on RLE when OOB) Knee Immobilizer - Left: On at all times (L knee locked in extension) Other Brace: CAM walker boot RLE-when OOB- can be off in bed. Restrictions Weight Bearing Restrictions: Yes RLE Weight Bearing: Non weight bearing LLE Weight Bearing: Weight bearing as tolerated Other Position/Activity Restrictions: pt is allowed gentle ROM to R ankle out of CAM boot when at rest with leg supported - per acute chart review   Therapy/Group: Individual Therapy Cruzita Lederer Dewaine Morocho, PT, DPT  07/17/2021, 7:47 AM

## 2021-07-17 NOTE — Progress Notes (Signed)
Occupational Therapy Session Note  Patient Details  Name: Courtney Berg MRN: [id number] Date of Birth: 07/22/1956  Today's Date: 07/17/2021 OT Individual Time: 0832-0941 OT Individual Time Calculation (min): 69 min    Short Term Goals: Week 1:  OT Short Term Goal 1 (Week 1): Pt will complete LB dressing with mod assist with AE as needed OT Short Term Goal 2 (Week 1): Pt will complete squat pivot transfers with min assist without change in BP OT Short Term Goal 3 (Week 1): Pt will complete sit > stand with min assist to prepare for LB dressing at sit > stand level  Skilled Therapeutic Interventions/Progress Updates:    Pt received supine with no c/o pain at rest, requesting to take shower. Pt completed bed mobility with supervision to EOB. Pt able to recall all precautions without cueing. Lateral scoot transfer (without SB) to w/c with assist only to stabilize equipment. Pt taken to tub shower room to allow room for LLE to remain in full knee extension. Cam boot on RLE and KI on LLE. Pt completed lateral scoot to TTB with CGA, assist on equipment and for managing LLE. Pt able to doff LB clothing with min A to remove distally. Cueing for method to reduce RLE weightbearing when laterally leaning. BP 135.67. Pt able to complete all bathing seated on TTB with set up assist.  Discussed d/c planning and shower set up. Pt transferred back to w/c with CGA. UB dressing set up assist. LB dressing with ues of reacher- min A. Min A for sit > stand with cueing required for NWB status- more like TDWB. Pt completed grooming tasks with set up assist. Return to room, pt left siting up in the w/c with all needs met.   Therapy Documentation Precautions:  Precautions Precautions: Fall Precaution Comments: orthostatic hypotension Required Braces or Orthoses: Other Brace, Knee Immobilizer - Left (CAM boot on RLE when OOB) Knee Immobilizer - Left: On at all times (L knee locked in extension) Other Brace: CAM  walker boot RLE-when OOB- can be off in bed. Restrictions Weight Bearing Restrictions: Yes RLE Weight Bearing: Non weight bearing LLE Weight Bearing: Weight bearing as tolerated Other Position/Activity Restrictions: pt is allowed gentle ROM to R ankle out of CAM boot when at rest with leg supported - per acute chart review Therapy/Group: Individual Therapy  Sandra H Davis 07/17/2021, 6:10 AM 

## 2021-07-18 NOTE — Progress Notes (Addendum)
Physical Therapy Session Note  Patient Details  Name: Courtney Berg MRN: [id number] Date of Birth: 1956-08-31  Today's Date: 07/18/2021 PT Individual Time: [phone]; 1525-1610 PT Individual Time Calculation (min): 56 min , 45 min  Short Term Goals: Week 1:  PT Short Term Goal 1 (Week 1): Pt will perform bed mobility w/supervision for improved functional mobility PT Short Term Goal 2 (Week 1): Pt will perform sit <>stand w/LRAD and CGA while maintaining WB precautions PT Short Term Goal 3 (Week 1): Pt will perform bed <>chair transfers w/min A and LRAD  Skilled Therapeutic Interventions/Progress Updates:  Tx 1:  Pt resting in bed.  She rated pain 1/10 R ankle, premedicated. Bledsoe in place LLE  Rolling L using bed features, supervision, for PT to place bed pan.  Pt has a BSC in BR, but it does not have drop arms.  Pt continent of bladder using BP; peri care with set up.  PT adjusted L Bledsoe brace higher on LLE .  PT donned L TED and abdominal binder with pt in supine, bed mobility as above.  Therapeutic exercise performed with LE to increase strength for functional mobility: in supine with HOB raised,  15 x 1 each: R ankle circles counter clockwise and clockwise, R ankle eversion, L short arc quad knee extension, L straight leg raises, bil adductor squeezes against towel roll. Sitting: 15 x 1 bil gluteal sets.  PT donned L surgical boot.  Supine> sit using bed features.  Squat pivot bed> wc to R with min assist.  Wc propulsion x 300' on level tile, with supervison.  Cues for efficiency and tight spaces.  At end of session, pt in wc with seat belt alarm set and needs at hand.  PT informed Caryl Pina, RN that pt might need to return to bed after 30-60 min.  Tx 2:  Pt resting in bed.  Family requested orthostatic readings.  PT instructed pt's sister in donning abdominal binder. L TED , L Bledsoe , R surgical boot already donned.  Supine- BP 124/52, HR 71 With binder on- Sitting EOB-  BP  125/65 , HR 83 With binder on- Standing with BilUE support BP  128/65  Therapeutic exercises performed with LEs to increase strength for functional mobility. R/L side lying for 12 x 1 L/R hip abduction with flexed hips.  Seated EOB 15 x 1 R hip flexion with flexed knee.  Standing : 15 x 1 R hip and knee flexion.    Sit> stand from raised bed, CGA and  max cues. Gait training with RW x 3' x 2 with max cues and CGA. Supine>< sitting iwht supervision and cues.  Rolling and scooting laterally and up/down in bed to reposition herself and to assist with donning binder, supervision.  Husband showed PT pics of hospital bed, ramp and shower chair that they already own.  At end of session, pt resting in bed with alarm set and needs at hand.       Therapy Documentation Precautions:  Precautions Precautions: Fall Precaution Comments: orthostatic hypotension Required Braces or Orthoses: Other Brace, Knee Immobilizer - Left (CAM boot on RLE when OOB) Knee Immobilizer - Left: On at all times (L knee locked in extension) Other Brace: CAM walker boot RLE-when OOB- can be off in bed. Restrictions Weight Bearing Restrictions: Yes RLE Weight Bearing: Non weight bearing LLE Weight Bearing: Weight bearing as tolerated (with KI) Other Position/Activity Restrictions: pt is allowed gentle ROM to R ankle out of CAM boot when  at rest with leg supported - per acute chart review     Therapy/Group: Individual Therapy  Courtney Berg 07/18/2021, 10:27 AM

## 2021-07-18 NOTE — Plan of Care (Signed)
  Problem: Consults Goal: RH GENERAL PATIENT EDUCATION Description: See Patient Education module for education specifics. Outcome: Progressing   Problem: RH BOWEL ELIMINATION Goal: RH STG MANAGE BOWEL WITH ASSISTANCE Description: STG Manage Bowel with mod I  Assistance. Outcome: Progressing Goal: RH STG MANAGE BOWEL W/MEDICATION W/ASSISTANCE Description: STG Manage Bowel with Medication with mod I Assistance. Outcome: Progressing   Problem: RH SAFETY Goal: RH STG ADHERE TO SAFETY PRECAUTIONS W/ASSISTANCE/DEVICE Description: STG Adhere to Safety Precautions With cues Assistance/Device. Outcome: Progressing   Problem: RH PAIN MANAGEMENT Goal: RH STG PAIN MANAGED AT OR BELOW PT'S PAIN GOAL Description: At or below level 4 Outcome: Progressing   Problem: RH KNOWLEDGE DEFICIT GENERAL Goal: RH STG INCREASE KNOWLEDGE OF SELF CARE AFTER HOSPITALIZATION Description: Patient will be able to manage care at discharge using handouts and educational resources independently Outcome: Progressing

## 2021-07-18 NOTE — Progress Notes (Addendum)
Occupational Therapy Session Note  Patient Details  Name: KANDISE RIEHLE MRN: [id number] Date of Birth: 1956/05/14  Today's Date: 07/18/2021 OT Individual Time: 1050-1159 OT Individual Time Calculation (min): 69 min   OT Individual Time: 3174-0992 OT Individual Time Calculation (min): 28 min    Short Term Goals: Week 1:  OT Short Term Goal 1 (Week 1): Pt will complete LB dressing with mod assist with AE as needed OT Short Term Goal 2 (Week 1): Pt will complete squat pivot transfers with min assist without change in BP OT Short Term Goal 3 (Week 1): Pt will complete sit > stand with min assist to prepare for LB dressing at sit > stand level  Skilled Therapeutic Interventions/Progress Updates:  Session 1: Patient met seated in wc in agreement with OT treatment session. 0/10 pain reported at rest and with activity. Patient with request for bathing at shower level. Spatial restrictions inhibit patient from completing ADLs in hospital room which patient expressed frustration about. Patient able to maneuver wc around hospital room to retrieve ADL items and self-propel wc from hospital room down to bathroom on 4W without need for rest breaks. Lateral scoot wc <> tub bench with Min A and increased time. UB bathing/dressing with set-up and LB bathing/dressing with Mod A. Time spent discussing accessibility of home and available DME.  Patient reports continued need for drop arm wc and drop arm BSC. Lateral scoot from wc to bed surface. Bed alarm set and call bell within reach at conclusion of session.   Session 2: Patient met lying supine in bed. Wide drop-arm BSC obtained for use. Patient able to complete lateral scoots from EOB to [facility] with Min A and min cues for NWB on RLE. 3/3 parts of toileting task completed with Min A for clothing management in standing. Patient able to complete hygiene with supervision A and lateral leans seated on BSC. Patient with continued orthostatic hypotension dropping from  145/69 seated on BSC to 105/65 in standing although asymptomatic. Stand-pivot to EOB with good adherence to RLE NWB precautions. BP 144/57 in supine at conclusion of session. Bed alarm activated, call bell within reach and all needs met.   Therapy Documentation Precautions:  Precautions Precautions: Fall Precaution Comments: orthostatic hypotension Required Braces or Orthoses: Other Brace, Knee Immobilizer - Left (CAM boot on RLE when OOB) Knee Immobilizer - Left: On at all times (L knee locked in extension) Other Brace: CAM walker boot RLE-when OOB- can be off in bed. Restrictions Weight Bearing Restrictions: Yes RLE Weight Bearing: Non weight bearing LLE Weight Bearing: Weight bearing as tolerated (with immobilizer) Other Position/Activity Restrictions: pt is allowed gentle ROM to R ankle out of CAM boot when at rest with leg supported - per acute chart review  Therapy/Group: Individual Therapy  Breea Loncar R Howerton-Davis 07/18/2021, 8:45 AM

## 2021-07-19 NOTE — Plan of Care (Signed)
  Problem: Consults Goal: RH GENERAL PATIENT EDUCATION Description: See Patient Education module for education specifics. Outcome: Progressing   Problem: RH BOWEL ELIMINATION Goal: RH STG MANAGE BOWEL WITH ASSISTANCE Description: STG Manage Bowel with mod I  Assistance. Outcome: Progressing Goal: RH STG MANAGE BOWEL W/MEDICATION W/ASSISTANCE Description: STG Manage Bowel with Medication with mod I Assistance. Outcome: Progressing   Problem: RH SAFETY Goal: RH STG ADHERE TO SAFETY PRECAUTIONS W/ASSISTANCE/DEVICE Description: STG Adhere to Safety Precautions With cues Assistance/Device. Outcome: Progressing   Problem: RH PAIN MANAGEMENT Goal: RH STG PAIN MANAGED AT OR BELOW PT'S PAIN GOAL Description: At or below level 4 Outcome: Progressing   Problem: RH KNOWLEDGE DEFICIT GENERAL Goal: RH STG INCREASE KNOWLEDGE OF SELF CARE AFTER HOSPITALIZATION Description: Patient will be able to manage care at discharge using handouts and educational resources independently Outcome: Progressing

## 2021-07-20 DIAGNOSIS — S82002S Unspecified fracture of left patella, sequela: Secondary | ICD-10-CM

## 2021-07-20 DIAGNOSIS — I951 Orthostatic hypotension: Secondary | ICD-10-CM

## 2021-07-20 DIAGNOSIS — S82891S Other fracture of right lower leg, sequela: Secondary | ICD-10-CM

## 2021-07-20 LAB — CBC
HCT: 32.1 % — ABNORMAL LOW (ref 36.0–46.0)
Hemoglobin: 10.3 g/dL — ABNORMAL LOW (ref 12.0–15.0)
MCH: 30 pg (ref 26.0–34.0)
MCHC: 32.1 g/dL (ref 30.0–36.0)
MCV: 93.6 fL (ref 80.0–100.0)
Platelets: 449 10*3/uL — ABNORMAL HIGH (ref 150–400)
RBC: 3.43 MIL/uL — ABNORMAL LOW (ref 3.87–5.11)
RDW: 13.2 % (ref 11.5–15.5)
WBC: 5.4 10*3/uL (ref 4.0–10.5)
nRBC: 0 % (ref 0.0–0.2)

## 2021-07-20 LAB — BASIC METABOLIC PANEL
Anion gap: 8 (ref 5–15)
BUN: 12 mg/dL (ref 8–23)
CO2: 27 mmol/L (ref 22–32)
Calcium: 8.9 mg/dL (ref 8.9–10.3)
Chloride: 103 mmol/L (ref 98–111)
Creatinine, Ser: 0.62 mg/dL (ref 0.44–1.00)
GFR, Estimated: >60 mL/min (ref >60)
Glucose, Bld: 97 mg/dL (ref 70–99)
Potassium: 3.7 mmol/L (ref 3.5–5.1)
Sodium: 138 mmol/L (ref 135–145)

## 2021-07-20 NOTE — Progress Notes (Signed)
PROGRESS NOTE   Subjective/Complaints: Unhappy about breakfast. Feels that she's doing well enough to go home. PRAFO irritates incision  ROS: Limited due to  behavioral    Objective:   No results found. Recent Labs    07/20/21 0545  WBC 5.4  HGB 10.3*  HCT 32.1*  PLT 449*    Recent Labs    07/20/21 0545  NA 138  K 3.7  CL 103  CO2 27  GLUCOSE 97  BUN 12  CREATININE 0.62  CALCIUM 8.9     Intake/Output Summary (Last 24 hours) at 07/20/2021 1408 Last data filed at 07/20/2021 0726 Gross per 24 hour  Intake 240 ml  Output 750 ml  Net -510 ml        Physical Exam: Vital Signs Blood pressure (!) 102/52, pulse 60, temperature 97.9 F (36.6 C), temperature source Oral, resp. rate 17, height '5\' 8"'$  (1.727 m), weight 89.6 kg, SpO2 100 %. Constitutional: No distress . Vital signs reviewed. HEENT: NCAT, EOMI, oral membranes moist Neck: supple Cardiovascular: RRR without murmur. No JVD    Respiratory/Chest: CTA Bilaterally without wheezes or rales. Normal effort    GI/Abdomen: BS +, non-tender, non-distended Ext: no clubbing, cyanosis, or edema Psych: sl irritable  Skin  Left knee incision with min edema, incision intact without signs of infection. RLE with CAM boot in place.   Right ankle with flat blisters and lateral incision intact with sutures in place. Resolving ecchymosis medially.   Musc: Able to DF/PF right foot and flex right hip well. LLE limited by brace    Assessment/Plan: 1. Functional deficits which require 3+ hours per day of interdisciplinary therapy in a comprehensive inpatient rehab setting. Physiatrist is providing close team supervision and 24 hour management of active medical problems listed below. Physiatrist and rehab team continue to assess barriers to discharge/monitor patient progress toward functional and medical goals  Care Tool:  Bathing    Body parts bathed by patient:  Right arm, Left arm, Chest, Abdomen, Front perineal area, Face, Buttocks   Body parts bathed by helper: Buttocks Body parts n/a: Left upper leg, Right lower leg, Left lower leg (wrapped 2/2 brace)   Bathing assist Assist Level: Moderate Assistance - Patient 50 - 74%     Upper Body Dressing/Undressing Upper body dressing   What is the patient wearing?: Pull over shirt    Upper body assist Assist Level: Set up assist    Lower Body Dressing/Undressing Lower body dressing      What is the patient wearing?: Pants     Lower body assist Assist for lower body dressing: Moderate Assistance - Patient 50 - 74%     Toileting Toileting    Toileting assist Assist for toileting: Moderate Assistance - Patient 50 - 74%     Transfers Chair/bed transfer  Transfers assist  Chair/bed transfer activity did not occur: Safety/medical concerns (NWB RLE, orthostasis)  Chair/bed transfer assist level: Minimal Assistance - Patient > 75%     Locomotion Ambulation   Ambulation assist   Ambulation activity did not occur: Safety/medical concerns (NWB RLE, orthostasis)  Assist level: Contact Guard/Touching assist Assistive device: Walker-rolling Max distance: 5'   Walk  10 feet activity   Assist  Walk 10 feet activity did not occur: Safety/medical concerns (NWB RLE, orthostasis)        Walk 50 feet activity   Assist Walk 50 feet with 2 turns activity did not occur: Safety/medical concerns (NWB RLE, orthostasis)         Walk 150 feet activity   Assist Walk 150 feet activity did not occur: Safety/medical concerns (NWB RLE, orthostasis)         Walk 10 feet on uneven surface  activity   Assist Walk 10 feet on uneven surfaces activity did not occur: Safety/medical concerns (NWB RLE, orthostasis)         Wheelchair     Assist Is the patient using a wheelchair?: Yes Type of Wheelchair: Manual Wheelchair activity did not occur: Safety/medical concerns (NWB RLE,  orthostasis,  unable to transfer)  Wheelchair assist level: Independent Max wheelchair distance: >68'    Wheelchair 50 feet with 2 turns activity    Assist    Wheelchair 50 feet with 2 turns activity did not occur: Safety/medical concerns (NWB RLE, orthostasis, unable to transfer)   Assist Level: Independent   Wheelchair 150 feet activity     Assist  Wheelchair 150 feet activity did not occur: Safety/medical concerns (NWB RLE, orthostasis, unable to transfer)   Assist Level: Independent   Blood pressure (!) 102/52, pulse 60, temperature 97.9 F (36.6 C), temperature source Oral, resp. rate 17, height '5\' 8"'$  (1.727 m), weight 89.6 kg, SpO2 100 %.    Medical Problem List and Plan: 1.  Polytrauma with multiple fractures             -patient may shower but all incisions must be covered             -ELOS/Goals: 2 weeks MinA---earlier dc?  -Continue CIR therapies including PT, OT   -may wear CAm boot in bed 2.  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 Post-operative pain:  Decrease '5mg'$  Norco to 1 tab q4 H PRN. -- Continue Tylenol PRN.   4. Mood: LCSW to follow for evaluation and support.              -antipsychotic agents: N/A 5. Neuropsych: This patient is capable of making decisions on her own behalf. 6. Skin/Wound Care: Monitor wounds daily for healing.  7. Fluids/Electrolytes/Nutrition: encourage PO  -I personally reviewed the patient's labs today.   8.  Right ankle bimalleolar Fx s/p ORIF:  NWB RLE--CAM boot on when OOB.  9. Left patella Fx: WBAT with Timmothy Sours Joy brace locked in full extension.              --may remove brace in shower as long as knee kept in extension and brace replaced afterwards.   10. Acute blood loss anemia: Stable. Will recheck labs on Monday.  11. Anxiety: Improving with ego support.   12. Orthostatic hypotension: Monitor BP tid as well as orthostatic vitals.  --Wearing abdominal binder on chest--advised patient and PT to lower it a  bit for better support of BP --Continue midodrine and Florinef for BP support.  -bp's stable 13.Cellulitis right knee/ankle?: Resolved --sutures were removed  by ortho  14. Ileus: has resolved and is tolerating regular diet.   15. Obesity (BMI 30.03): provide dietary education.     LOS: 7 days A FACE TO FACE EVALUATION WAS PERFORMED  Meredith Staggers 07/20/2021, 2:08 PM

## 2021-07-20 NOTE — Progress Notes (Signed)
Orthopaedic Trauma Progress Note  SUBJECTIVE: Doing fairly well today.  Was able to shower which she was excited about. States therapies are going well but she continues to have some occasional dizziness.   OBJECTIVE:  General: Sitting up in bed, NAD Respiratory: No increased work of breathing.  LLE: Hinge brace in place. Incision stable. Swelling about the knee as expected. Able to wiggle toes.  Ankle dorsiflexion/plantarflexion intact.  Tolerated knee motion to about 25 degrees in bed in hinge brace. Endorses sensation to light touch throughout the extremity.  Compartments soft and compressible. + DP pulse  RLE: Incisions clean, dry, intact with no drainage. Ankle DF/PF intact. Able to wiggle toes.  Compartments soft and compressible.  Endorses sensation to light touch over all aspects of foot. Foot is warm and well-perfused. +DP pulse  IMAGING: Repeat x-rays left knee, right ankle from 07/16/2021 stable  ASSESSMENT: Courtney Berg is a 65 y.o. female s/p  ORIF RIGHT ANKLE FRACTURE 07/01/21 ORIF LEFT PATELLA 07/01/21  PLAN: Weightbearing: NWB RLE, WBAT LLE with hinge brace locked in full extension ROM:  - RLE: Ok for ankle ROM as tolerated at rest - LLE: Ok for knee flexion up to 30 degrees in brace. Must have brace locked in extension when ambulating Incisional and dressing care:  Ok to leave open to air Showering: Ok to shower with assistance. Incisions may get wet Orthopedic device(s): CAM boot RLE when OOB, Bledsoe brace LLE Pain management: per CIR VTE treatment: Eliquis Impediments to Fracture Healing: Vit D level 19, continue on D3 supplementation. Recommend continuing this at discharge  Dispo: Continue therapies as able. Will recheck patient's left knee on Wednesday. Possibly allow for increase of left knee motion in hinge brace in 1-2 weeks  Follow - up plan: We will continue to follow while in hospital. Plan for outpatient follow-up 2 weeks after d/c for repeat x-rays and  wound check  Contact information:  Katha Hamming MD, Patrecia Pace PA-C. After hours and holidays please check [email or web address] for group call information for [facility]   Courtney Mastrogiovanni A. Ricci Barker, PA-C [phone] (office) [email or web address]

## 2021-07-20 NOTE — Progress Notes (Signed)
Occupational Therapy Session Note  Patient Details  Name: Courtney Berg MRN: [id number] Date of Birth: 04-05-1956  Today's Date: 07/20/2021 OT Individual Time: [id number] OT Individual Time Calculation (min): 25 min   Short Term Goals: Week 1:  OT Short Term Goal 1 (Week 1): Pt will complete LB dressing with mod assist with AE as needed OT Short Term Goal 2 (Week 1): Pt will complete squat pivot transfers with min assist without change in BP OT Short Term Goal 3 (Week 1): Pt will complete sit > stand with min assist to prepare for LB dressing at sit > stand level  Skilled Therapeutic Interventions/Progress Updates:    Pt greeted in bed, ice on her Lt knee to manage pain. She was finishing up her conversation on the phone, about insurance coverage. Pt agreeable to bedlevel therapy with HOB elevated focusing on UB strengthening and endurance for carryover during functional transfers. Guided her through UB exercises using 3# bar x10 reps 2 sets. Pt verbalized that she feels very ready to d/c home this week and that her family has most of the DME she will need and her home will be w/c accessible. Left her in the bed with all needs within reach and bed alarm set.   Therapy Documentation Precautions:  Precautions Precautions: Fall Precaution Comments: orthostatic hypotension Required Braces or Orthoses: Other Brace, Knee Immobilizer - Left (CAM boot on RLE when OOB) Knee Immobilizer - Left: On at all times (L knee locked in extension) Other Brace: CAM walker boot RLE-when OOB- can be off in bed. Restrictions Weight Bearing Restrictions: Yes RLE Weight Bearing: Non weight bearing LLE Weight Bearing: Weight bearing as tolerated Other Position/Activity Restrictions: pt is allowed gentle ROM to R ankle out of CAM boot when at rest with leg supported - per acute chart review   ADL: ADL Upper Body Bathing: Setup Where Assessed-Upper Body Bathing: Shower (roll-in shower chair) Lower Body  Bathing: Dependent Where Assessed-Lower Body Bathing: Shower Upper Body Dressing: Minimal assistance Where Assessed-Upper Body Dressing: Chair Lower Body Dressing: Unable to assess Toilet Transfer: Minimal assistance Toilet Transfer Method: Sit pivot (lateral scoot) Toilet Transfer Equipment:  (roll in shower chair/commode)     Therapy/Group: Individual Therapy  Belva Koziel A Francys Bolin 07/20/2021, 12:29 PM

## 2021-07-20 NOTE — Progress Notes (Signed)
Occupational Therapy Session Note  Patient Details  Name: Courtney Berg MRN: [id number] Date of Birth: 1956-08-04  Today's Date: 07/20/2021 OT Individual Time: 1303-1403 OT Individual Time Calculation (min): 60 min    Short Term Goals: Week 1:  OT Short Term Goal 1 (Week 1): Pt will complete LB dressing with mod assist with AE as needed OT Short Term Goal 2 (Week 1): Pt will complete squat pivot transfers with min assist without change in BP OT Short Term Goal 3 (Week 1): Pt will complete sit > stand with min assist to prepare for LB dressing at sit > stand level  Skilled Therapeutic Interventions/Progress Updates:  Pt greeted supine in bed agreeable to OT intervention. Session focus on BADL reeducation. Pt completed bed mobility with supervision with HOB flat. Pt completed stand pivot transfer from EOB >w/c with MIN A. Pt transported to shower room as pts shower is inaccessible for pt. Pt completed lateral scoot transfer from w/c to TTB going towards pts R side with MIN A. Incisions covered for showering, pt required overall set- up assist for bathing from TTB. Pt exited shower in similar fashion as previously indicated but needed MOD A to exit back to w/c. Pt completed grooming tasks from sink with supervision. Pt transported back to room with total A. pt left supine in bed with bed alarm activated and all needs within reach.              Therapy Documentation Precautions:  Precautions Precautions: Fall Precaution Comments: orthostatic hypotension Required Braces or Orthoses: Other Brace, Knee Immobilizer - Left (CAM boot on RLE when OOB) Knee Immobilizer - Left: On at all times (L knee locked in extension) Other Brace: CAM walker boot RLE-when OOB- can be off in bed. Restrictions Weight Bearing Restrictions: Yes RLE Weight Bearing: Non weight bearing LLE Weight Bearing: Weight bearing as tolerated Other Position/Activity Restrictions: pt is allowed gentle ROM to R ankle out of CAM  boot when at rest with leg supported - per acute chart review  Pain: Pt reports mild pain in L knee; utilized ice, rest breaks and repositioning as pain mgmt strategies.    Therapy/Group: Individual Therapy  [name] 07/20/2021, 3:54 PM

## 2021-07-20 NOTE — Progress Notes (Signed)
Physical Therapy Session Note  Patient Details  Name: Courtney Berg MRN: [id number] Date of Birth: Sep 05, 1956  Today's Date: 07/20/2021 PT Individual Time: [id number] PT Individual Time Calculation (min): 76 min   Short Term Goals: Week 1:  PT Short Term Goal 1 (Week 1): Pt will perform bed mobility w/supervision for improved functional mobility PT Short Term Goal 2 (Week 1): Pt will perform sit <>stand w/LRAD and CGA while maintaining WB precautions PT Short Term Goal 3 (Week 1): Pt will perform bed <>chair transfers w/min A and LRAD  Skilled Therapeutic Interventions/Progress Updates:  Pt received supine in bed, cam and Bledsoe donned, denied pain but reported discomfort in L knee. Emphasis of session on transfers and weightbearing tolerance of LLE. Pt performed bed mobility w/supervision and donned L ted hose and abdominal binder w/max A. While sitting, BP read 120/63. Sit <>stand from elevated bed w/mod A for trunk control and anterior weight shift. Noted pt placed both hands on RW to stand, which promoted posterior lean. Reminded pt of proper hand placement and importance of NWB of RLE, pt verbalized understanding. Pt stood for 2 minutes w/supervision, BP read 131/68. Stand pivot to [facility] w/RW and min A for steadying, verbal cues for sequencing. Pt self-propelled mod I from room to ortho gym (>200') and performed sit <>stand from [facility] to RW w/min A, noted improved hand placement and use of "kickstand" w/RLE to prevent weightbearing. Pt performed car transfer w/min A, heavy verbal cues for sequencing and LLE management. Educated pt on reclining seat in car to allow more room for LLE to swing in, as pt had difficulty managing leg. Sit <>stand from elevated car w/CGA and stand pivot to [facility] w/min A and RW. Pt self-propelled from gym to room mod I and performed sit <>stand from [facility] to RW w/min A, noted better hand placement. Stand pivot to EOB and stand <>sit w/min A 2/2 uncontrolled lowering. Pt performed  bed mobility w/supervision and was left supine in bed, ice pack on L knee, w/all needs in reach. Reported pain as 1/10 in L knee, declined pain meds.   Therapy Documentation Precautions:  Precautions Precautions: Fall Precaution Comments: orthostatic hypotension Required Braces or Orthoses: Other Brace, Knee Immobilizer - Left (CAM boot on RLE when OOB) Knee Immobilizer - Left: On at all times (L knee locked in extension) Other Brace: CAM walker boot RLE-when OOB- can be off in bed. Restrictions Weight Bearing Restrictions: Yes RLE Weight Bearing: Non weight bearing LLE Weight Bearing: Weight bearing as tolerated Other Position/Activity Restrictions: pt is allowed gentle ROM to R ankle out of CAM boot when at rest with leg supported - per acute chart review   Therapy/Group: Individual Therapy Cruzita Lederer Keirston Saephanh, PT, DPT  07/20/2021, 7:43 AM

## 2021-07-20 NOTE — Progress Notes (Signed)
Occupational Therapy Session Note  Patient Details  Name: Courtney Berg MRN: [id number] Date of Birth: August 03, 1956  Today's Date: 07/20/2021 OT Individual Time: 1455-1540 OT Individual Time Calculation (min): 45 min    Short Term Goals: Week 1:  OT Short Term Goal 1 (Week 1): Pt will complete LB dressing with mod assist with AE as needed OT Short Term Goal 2 (Week 1): Pt will complete squat pivot transfers with min assist without change in BP OT Short Term Goal 3 (Week 1): Pt will complete sit > stand with min assist to prepare for LB dressing at sit > stand level  Skilled Therapeutic Interventions/Progress Updates:  Pt greeted supine in bed agreeable to OT intervention. Session focus on functional sit<>stands and functional transfers with RW. Pt completed bed mobility with supervision with pt with able to stand from heavily elevated bed with MIN A and RW.  MIN A for stand pivot transfer from EOB>w/c with RW. Pt completd x5 sit<>stands from w/c with overall MIN A - MOD A, OTA placed foot underneath pts R foot to ensure [place] however did note moments of [place] when transitioning into standing. Discussed importance of using momentum and hand placement, however pt with most difficulty shifting weight anteriorly and transitioning LUE from w/c to Rw. Pt completed toilet transfer with MIN A with RW and 3/3 toileting tasks with MIN A needing most assist to manage clothing. pt left supine in bed with bed alarm activated and all needs within reach.                    Therapy Documentation Precautions:  Precautions Precautions: Fall Precaution Comments: orthostatic hypotension Required Braces or Orthoses: Other Brace, Knee Immobilizer - Left (CAM boot on RLE when OOB) Knee Immobilizer - Left: On at all times (L knee locked in extension) Other Brace: CAM walker boot RLE-when OOB- can be off in bed. Restrictions Weight Bearing Restrictions: Yes RLE Weight Bearing: Non weight bearing LLE Weight  Bearing: Weight bearing as tolerated Other Position/Activity Restrictions: pt is allowed gentle ROM to R ankle out of CAM boot when at rest with leg supported - per acute chart review  Pain: Pt reports mild pain in L knee, utilized ice, repositioning and rest breaks as pain mgmt strategies.    Therapy/Group: Individual Therapy  Corinne Ports [facility] 07/20/2021, 4:08 PM

## 2021-07-21 MED ORDER — ALUM & MAG HYDROXIDE-SIMETH 200-200-20 MG/5ML PO SUSP
30.0000 mL | Freq: Four times a day (QID) | ORAL | Status: DC | PRN
  Administered 2021-07-22: 30 mL via ORAL
  Filled 2021-07-21: qty 30

## 2021-07-21 NOTE — Progress Notes (Signed)
Occupational Therapy Weekly Progress Note  Patient Details  Name: Courtney Berg MRN: [id number] Date of Birth: 12/11/1955  Beginning of progress report period: July 14, 2021 End of progress report period: July 21, 2021    Patient has met 3 of 3 short term goals.  Pt is progressing toward OT goals, performing sit <> stands with Min/Mod depending on height of surface, stand pivot and squat pivot transfers CGA/Min A, LB dressing with Min A to thread over boot, and showered at shower level with BLEs covered Supervision. Pt does wear CAM boot on RLE for NWB and bledsoe brace on LLE as she is WBAT. Occasional cues to ensure NWB on RLE.   Patient continues to demonstrate the following deficits: muscle weakness, decreased cardiorespiratoy endurance, decreased coordination and decreased motor planning, and decreased standing balance, decreased postural control, and decreased balance strategies and therefore will continue to benefit from skilled OT intervention to enhance overall performance with BADL and Reduce care partner burden.  Patient progressing toward long term goals..  Continue plan of care.  OT Short Term Goals Week 1:  OT Short Term Goal 1 (Week 1): Pt will complete LB dressing with mod assist with AE as needed OT Short Term Goal 1 - Progress (Week 1): Met OT Short Term Goal 2 (Week 1): Pt will complete squat pivot transfers with min assist without change in BP OT Short Term Goal 2 - Progress (Week 1): Met OT Short Term Goal 3 (Week 1): Pt will complete sit > stand with min assist to prepare for LB dressing at sit > stand level OT Short Term Goal 3 - Progress (Week 1): Met Week 2:  OT Short Term Goal 1 (Week 2): STGs = LTGs d/t ELOS    Therapy Documentation Precautions:  Precautions Precautions: Fall Precaution Comments: orthostatic hypotension Required Braces or Orthoses: Other Brace, Knee Immobilizer - Left (CAM boot on RLE when OOB) Knee Immobilizer - Left: On at all  times (L knee locked in extension) Other Brace: CAM walker boot RLE-when OOB- can be off in bed. Restrictions Weight Bearing Restrictions: Yes RLE Weight Bearing: Non weight bearing LLE Weight Bearing: Weight bearing as tolerated Other Position/Activity Restrictions: pt is allowed gentle ROM to R ankle out of CAM boot when at rest with leg supported - per acute chart review     Therapy/Group: Individual Therapy  Viona Gilmore 07/21/2021, 12:18 PM

## 2021-07-21 NOTE — Progress Notes (Signed)
Patient ID: Courtney Berg, female   DOB: 10/10/1956, 64 y.o.   MRN: [id number]  Met with pt to update regarding team conference goals of supervision-min assist and discharge date 9/1. She feels so ready to go home and husband can assist her. Discussed have spoken with Susan-Workers [employer] CM to update her on goals, discharge needs and discharge date. Have emailed her orders for home health and also bariatric drop-arm bedside commode and transfer board. She reports with the short notice it should not be an issue, will check with tomorrow. Work toward discharge Thursday. 

## 2021-07-21 NOTE — Progress Notes (Signed)
Patient complained of midline chest pain aggravated by deep inhalation. V/S stable, she said she probably had some indigestion from the chili in her sandwich this evening. Tylenol given and encouraged to eat. Given a snack. She verbalized having some relief after eating and propping her bed up. Informed Charge, observed closely.

## 2021-07-21 NOTE — Progress Notes (Signed)
Physical Therapy Discharge Summary  Patient Details  Name: Courtney Berg MRN: [id number] Date of Birth: [birth year]  Patient has met 4 of 6 long term goals due to improved activity tolerance, improved balance, increased strength, and decreased pain.  Patient to discharge at a wheelchair level  mod I and requiring supervision-min A for transfers .   Patient's care partner  participated in family training and is confident in his ability  to provide the necessary physical assistance at discharge. Pt requires reminders for hand placement for safe transfers and has verbalized/demonstrated understanding to use a sliding board for safest transfers until her weightbearing precautions are reduced.   Reasons goals not met: Pt did not meet gait goal of 82f with min A as pt is currently only able to ambulate up to 525fwith RW and CGA due to difficulty maintaining precautions, decreased balance/postural control, and generalized weakness/deconditioning. Pt did not meet sit<>stand goal of supervision as pt currently requires CGA from extremely elevated bed due to decreased balance/postural control and LLE locked into extension with RLE NWB status.   Recommendation:  Patient will benefit from ongoing skilled PT services in home health setting to continue to advance safe functional mobility, address ongoing impairments in global deconditioning, weightbearing precautions, and minimize fall risk.  Equipment: Sliding board , already has RW and WC  Reasons for discharge: treatment goals met  Patient/family agrees with progress made and goals achieved: Yes  PT Discharge Precautions/Restrictions Precautions Precautions: Fall Precaution Comments: orthostatic hypotension Required Braces or Orthoses: Other Brace;Knee Immobilizer - Left (CAM boot on RLE, Bledsoe on LLE locked in extension while weightbearing) Knee Immobilizer - Left: On at all times Restrictions Weight Bearing Restrictions: Yes RLE Weight  Bearing: Non weight bearing LLE Weight Bearing: Weight bearing as tolerated Other Position/Activity Restrictions: Pt allowed to have Bledsoe unlocked and up to 30 degrees of flexion while nonweightbearing. Must be locked in extension while ambulating Pain Interference Pain Interference Pain Effect on Sleep: 1. Rarely or not at all Pain Interference with Therapy Activities: 1. Rarely or not at all Pain Interference with Day-to-Day Activities: 2. Occasionally Vision/Perception  Perception Perception: Within Functional Limits Praxis Praxis: Intact  Cognition Overall Cognitive Status: Within Functional Limits for tasks assessed Arousal/Alertness: Awake/alert Orientation Level: Oriented X4 Safety/Judgment: Appears intact Sensation Sensation Light Touch: Impaired by gross assessment (Significant swelling bilaterally) Coordination Gross Motor Movements are Fluid and Coordinated: No Coordination and Movement Description: NWB RLE, WBAT LLE locked in extension Finger Nose Finger Test: WFLifecare Behavioral Health Hospitalotor  Motor Motor: Within Functional Limits Motor - Discharge Observations: Pt limited by weightbearing precautions  Mobility Bed Mobility Bed Mobility: Rolling Right;Rolling Left;Supine to Sit;Sitting - Scoot to EdMarshall & Ilsleyf Bed;Sit to Supine;Scooting to HOProvidence Tarzana Medical Centerolling Right: Independent with assistive device (bedrail, HOB flat) Rolling Left: Independent with assistive device (bedrail, HOB flat) Supine to Sit: Independent with assistive device (bedrail, HOB elevated) Sitting - Scoot to Edge of Bed: Independent with assistive device Sit to Supine: Independent with assistive device (bedrail, HOB elevated) Scooting to HOGeorgia Surgical Center On Peachtree LLCIndependent with assistive device (bedrail) Transfers Transfers: Sit to Stand;Stand to Sit;Lateral/Scoot Transfers;Stand Pivot Transfers Sit to Stand: Contact Guard/Touching assist (Highly elevated surface, RW) Stand to Sit: Contact Guard/Touching assist Stand Pivot Transfers: Contact  Guard/Touching assist Lateral/Scoot Transfers: Supervision/Verbal cueing Transfer (Assistive device): Rolling walker Locomotion  Gait Ambulation: No Gait Gait: No Stairs / Additional Locomotion Stairs: No WhProduct managerobility: Yes Wheelchair Assistance: Independent with asCamera operatorBoth upper extremities Wheelchair Parts Management: Independent Distance: >150'  Trunk/Postural Assessment  Cervical Assessment Cervical Assessment: Exceptions to [facility] (Forward head) Thoracic Assessment Thoracic Assessment: Exceptions to [facility] (Rounded shoulders) Lumbar Assessment Lumbar Assessment: Exceptions to [facility] (Posterior pelvic tilt) Postural Control Postural Control: Within Functional Limits  Balance Balance Balance Assessed: Yes Static Sitting Balance Static Sitting - Balance Support: Feet supported;Bilateral upper extremity supported Static Sitting - Level of Assistance: 7: Independent Dynamic Sitting Balance Dynamic Sitting - Balance Support: Feet supported;During functional activity Dynamic Sitting - Level of Assistance: 5: Stand by assistance Sitting balance - Comments: BLEs extended, from elevated surface Static Standing Balance Static Standing - Balance Support: Bilateral upper extremity supported;During functional activity Static Standing - Level of Assistance: 5: Stand by assistance Dynamic Standing Balance Dynamic Standing - Balance Support: Bilateral upper extremity supported;During functional activity Dynamic Standing - Level of Assistance: 4: Min assist Dynamic Standing - Balance Activities: Lateral lean/weight shifting;Forward lean/weight shifting;Reaching for objects Extremity Assessment  RLE Assessment RLE Assessment: Not tested General Strength Comments: Grossly 3+/5 LLE Assessment LLE Assessment: Not tested General Strength Comments: Grossly 3+/5   Cruzita Lederer Plaster, PT, DPT Becky Sax PT, DPT  07/21/2021, 4:05 PM

## 2021-07-21 NOTE — Patient Care Conference (Signed)
Inpatient RehabilitationTeam Conference and Plan of Care Update Date: 07/21/2021   Time: 12:07 PM    Patient Name: Courtney Berg      Medical Record Number: [id number]  Date of Birth: 1956/01/10 Sex: Female         Room/Bed: 15C02C/5C02C-01 Payor Info: Payor: [employer] / Plan: [employer] / Product Type: *No Product type* /    Admit Date/Time:  07/13/2021  4:27 PM  Primary Diagnosis:  Closed right ankle fracture  Hospital Problems: Principal Problem:   Closed right ankle fracture    Expected Discharge Date: Expected Discharge Date: 07/28/21  Team Members Present:       Current Status/Progress Goal Weekly Team Focus  Bowel/Bladder             Swallow/Nutrition/ Hydration             ADL's   CGA-min A lateral scoot/slide board transfers; set up UB ADLs, min-mod LB ADLs with AE, limited somewhat by orthostatic vitals and precautions  Supervision  ADL retraining, functional transfers, sit<>stand level tasks/endurance, BP management, safety/fall prevention, DME/AE   Mobility   Supervision bed mobility, min A transfers  Supervision  Transfers, maintaining WB precautions, ROM of BLE   Communication             Safety/Cognition/ Behavioral Observations            Pain             Skin               Discharge Planning:  Home with husband who can provide assistance-Work[employer] CM following and will assist with discharge needs.   Team Discussion: Pain issues address; modify knee brace wearing and PRAFO off HS with CAM boot on. Monitoring orthostatic hypotension, ileus resolved and continent of bowel and bladder.  Patient on target to meet rehab goals: yes, currently CGA - min assist for lateral scoot and slide-board transfers. Requires set up for upper body care and min - mod assist for lower body care with adaptive equipment. Able to maintain WB precautions. Goals for discharge set at supervision level   *See Care Plan and progress notes for long  and short-term goals.   Revisions to Treatment Plan:  Working on ADL retraining, transfers, sit to stand transfers, fall prevention and safety  Teaching Needs: Safety, skin care, medications, WB precautions, etc  Current Barriers to Discharge: Decreased caregiver support, Home enviroment access/layout, and Weight bearing restrictions  Possible Resolutions to Barriers: Hh follow up transition to OP once WB precautions lifted Family Education with spouse Cherlynn [facility] recommended     Medical Summary               I attest that I was present, lead the team conference, and concur with the assessment and plan of the team.   Margarito Liner 07/21/2021, 12:06 PM

## 2021-07-21 NOTE — Progress Notes (Signed)
Occupational Therapy Session Note  Patient Details  Name: Courtney Berg MRN: [id number] Date of Birth: 1956-10-05  Today's Date: 07/21/2021 OT Individual Time: [id number] OT Individual Time Calculation (min): 71 min    Short Term Goals: Week 1:  OT Short Term Goal 1 (Week 1): Pt will complete LB dressing with mod assist with AE as needed OT Short Term Goal 2 (Week 1): Pt will complete squat pivot transfers with min assist without change in BP OT Short Term Goal 3 (Week 1): Pt will complete sit > stand with min assist to prepare for LB dressing at sit > stand level   Skilled Therapeutic Interventions/Progress Updates:    Pt greeted at time of session semireclined in bed resting agreeable to OT session, no pain reported throughout. Pt already with CAM boot on RLE and KI on LLE, remained throughout. Focus of session on sit <> stands from elevated bed surface and lower surface to improve anterior weight shift and form to improve LLE strengthening, Mod A from lower surfaces and CGA from very elevated bed surface. Stand pivot  bed <> BSC with Min A for sit <> stand and CGA for transfer with RW and good WB adherence. Also raised BSC height for the pt as well as it was very low and pt reported improved ability for sit <> stand from this height. LB dressing to change shorts with Min A overall, performing at sit <> stand level and determining when to elevate/lower bed as needed. Pt emotional at end of session about life stressors, emotional support provided. Discussion as well and education regarding checking LLE for swelling/edema under immobilizer. In bed alarm on call bell in reach.    Therapy Documentation Precautions:  Precautions Precautions: Fall Precaution Comments: orthostatic hypotension Required Braces or Orthoses: Other Brace, Knee Immobilizer - Left (CAM boot on RLE when OOB) Knee Immobilizer - Left: On at all times (L knee locked in extension) Other Brace: CAM walker boot RLE-when  OOB- can be off in bed. Restrictions Weight Bearing Restrictions: Yes RLE Weight Bearing: Non weight bearing LLE Weight Bearing: Weight bearing as tolerated Other Position/Activity Restrictions: pt is allowed gentle ROM to R ankle out of CAM boot when at rest with leg supported - per acute chart review    Therapy/Group: Individual Therapy  Viona Gilmore 07/21/2021, 7:20 AM

## 2021-07-21 NOTE — Progress Notes (Signed)
Pt verbalized feeling better. V/S rechecked and BP still a little low. Given her midodrine early. Iced left knee. Encouraged to rest.

## 2021-07-21 NOTE — Progress Notes (Signed)
Physical Therapy Weekly Progress Note  Patient Details  Name: Courtney Berg MRN: [id number] Date of Birth: 02/06/1956  Beginning of progress report period: July 14, 2021 End of progress report period: July 21, 2021  Today's Date: 07/21/2021 PT Individual Time: 4503-8882; 8003-4917 PT Individual Time Calculation (min): 55 min and 74 min  Patient has met 2 of 3 short term goals. Pt demonstrates use of sliding board and lateral scoot technique w/supervision. Pt able to perform bed mobility mod I w/use of bedrail. Pt requires CGA-mod A for sit <>stand depending on height of seated surface and currently requires highly elevated surface to complete sit <>stand at CGA level. Pt understands to use sliding board for safe transfers until she is able to bear weight on RLE. Pt's family to complete family training tomorrow.   Patient continues to demonstrate the following deficits muscle weakness, muscle joint tightness, and muscle paralysis and decreased standing balance, decreased balance strategies, and difficulty maintaining precautions and therefore will continue to benefit from skilled PT intervention to increase functional independence with mobility.  Patient progressing toward long term goals..  Continue plan of care.  PT Short Term Goals Week 1:  PT Short Term Goal 1 (Week 1): Pt will perform bed mobility w/supervision for improved functional mobility PT Short Term Goal 1 - Progress (Week 1): Met PT Short Term Goal 2 (Week 1): Pt will perform sit <>stand w/LRAD and CGA while maintaining WB precautions PT Short Term Goal 2 - Progress (Week 1): Progressing toward goal PT Short Term Goal 3 (Week 1): Pt will perform bed <>chair transfers w/min A and LRAD PT Short Term Goal 3 - Progress (Week 1): Met Week 2:  PT Short Term Goal 1 (Week 2): STG = LTG due to LOS  Skilled Therapeutic Interventions/Progress Updates:  Session 1 Pt received supine in bed w/Bledsoe donned, denied pain but reported  increased fatigue 2/2 not sleeping well previous night. Pt very disgruntled w/hospital stay, repeatedly told therapist she wanted to go home ASAP. Noted significantly increased swelling in LLE compared to yesterday, pt reported she had to take off Bledsoe in middle of night due to it being too small on her leg. Donned a thigh-high ted hose on LLE and calf-high ted hose on RLE to assist w/swelling. Donned Bledsoe and Cam boot w/max A. Pt performed bed mobility mod I w/ use of bedrail and performed lateral scoot transfer to [facility] on R side w/supervision. Pt doffed pants w/supervision and performed peri care w/set-up assist. Sit <>stand from [facility] to RW w/2 attempts 2/2 signifcant retropulsion and improper hand placement for transfer set-up. Provided education on proper hand placement and importance of placing RLE out from body to reduce temptation to weightbear on it, pt verbalized understanding. Sit <>stand from [facility] w/min A and proper technique. Stand pivot to elevated EOB w/min A for steadying. Pt performed stand <>sit w/supervision and bed mobility mod I. Pt very verbose throughout session and was left supine in bed w/all needs in reach, denied pain.    Session 2  Pt received toileting on [facility] w/NT. Witnessed stand pivot w/RW and NT requiring CGA to safely turn and maintain weightbearing precautions. Pt denied pain and was agreeable to PT. Informed pt of new DC date, pt very excited but verbalized some anxiety regarding logistics once she got home. Majority of session spent on educating pt on transfers at home, equipment, and what to expect after [place] present for portion of session to discuss equipment and services  after DC. Pt's husband to be present for session tomorrow to go through family training. Practiced sit <>stands x5 from highly elevated surface w/CGA, verbal cues for hand placement. Pt stood for 5 minutes and checked BP in standing, read 136/53 without abdominal binder. Pt denied feelings of  dizziness or lightheadedness while standing. Pt performed bed mobility mod I and was left supine in bed w/all needs in reach.   Therapy Documentation Precautions:  Precautions Precautions: Fall Precaution Comments: orthostatic hypotension Required Braces or Orthoses: Other Brace, Knee Immobilizer - Left (CAM boot on RLE when OOB) Knee Immobilizer - Left: On at all times (L knee locked in extension) Other Brace: CAM walker boot RLE-when OOB- can be off in bed. Restrictions Weight Bearing Restrictions: Yes RLE Weight Bearing: Non weight bearing LLE Weight Bearing: Weight bearing as tolerated Other Position/Activity Restrictions: pt is allowed gentle ROM to R ankle out of CAM boot when at rest with leg supported - per acute chart review   Therapy/Group: Individual Therapy  Cruzita Lederer Plaster, PT, DPT 07/21/2021, 1:43 PM

## 2021-07-21 NOTE — Progress Notes (Signed)
PROGRESS NOTE   Subjective/Complaints: Had a better night other than some food which didn't agree with her. Feels better this morning. Still anxious to go home  ROS: Patient denies fever, rash, sore throat, blurred vision, nausea, vomiting, diarrhea, cough, shortness of breath or chest pain,   headache, or mood change.    Objective:   No results found. Recent Labs    07/20/21 0545  WBC 5.4  HGB 10.3*  HCT 32.1*  PLT 449*    Recent Labs    07/20/21 0545  NA 138  K 3.7  CL 103  CO2 27  GLUCOSE 97  BUN 12  CREATININE 0.62  CALCIUM 8.9     Intake/Output Summary (Last 24 hours) at 07/21/2021 1241 Last data filed at 07/21/2021 0700 Gross per 24 hour  Intake 716 ml  Output --  Net 716 ml        Physical Exam: Vital Signs Blood pressure (!) 104/50, pulse (!) 59, temperature 98.1 F (36.7 C), temperature source Oral, resp. rate 17, height '5\' 8"'$  (1.727 m), weight 89.6 kg, SpO2 97 %. Constitutional: No distress . Vital signs reviewed. HEENT: NCAT, EOMI, oral membranes moist Neck: supple Cardiovascular: RRR without murmur. No JVD    Respiratory/Chest: CTA Bilaterally without wheezes or rales. Normal effort    GI/Abdomen: BS +, non-tender, non-distended Ext: no clubbing, cyanosis, or edema Psych: pleasant and cooperative   Skin  Left knee incision with min edema, incision intact without signs of infection. RLE wound CDI. A few bruises  Musc: Able to DF/PF right foot and flex right hip well. LLE limited by brace    Assessment/Plan: 1. Functional deficits which require 3+ hours per day of interdisciplinary therapy in a comprehensive inpatient rehab setting. Physiatrist is providing close team supervision and 24 hour management of active medical problems listed below. Physiatrist and rehab team continue to assess barriers to discharge/monitor patient progress toward functional and medical goals  Care  Tool:  Bathing    Body parts bathed by patient: Right arm, Left arm, Chest, Abdomen, Front perineal area, Face, Buttocks   Body parts bathed by helper: Buttocks Body parts n/a: Right upper leg, Left upper leg, Right lower leg, Left lower leg   Bathing assist Assist Level: Set up assist     Upper Body Dressing/Undressing Upper body dressing   What is the patient wearing?: Pull over shirt    Upper body assist Assist Level: Set up assist    Lower Body Dressing/Undressing Lower body dressing      What is the patient wearing?: Pants     Lower body assist Assist for lower body dressing: Minimal Assistance - Patient > 75%     Toileting Toileting    Toileting assist Assist for toileting: Minimal Assistance - Patient > 75%     Transfers Chair/bed transfer  Transfers assist  Chair/bed transfer activity did not occur: Safety/medical concerns (NWB RLE, orthostasis)  Chair/bed transfer assist level: Minimal Assistance - Patient > 75%     Locomotion Ambulation   Ambulation assist   Ambulation activity did not occur: Safety/medical concerns (NWB RLE, orthostasis)  Assist level: Contact Guard/Touching assist Assistive device: Walker-rolling Max distance: 5'  Walk 10 feet activity   Assist  Walk 10 feet activity did not occur: Safety/medical concerns (NWB RLE, orthostasis)        Walk 50 feet activity   Assist Walk 50 feet with 2 turns activity did not occur: Safety/medical concerns (NWB RLE, orthostasis)         Walk 150 feet activity   Assist Walk 150 feet activity did not occur: Safety/medical concerns (NWB RLE, orthostasis)         Walk 10 feet on uneven surface  activity   Assist Walk 10 feet on uneven surfaces activity did not occur: Safety/medical concerns (NWB RLE, orthostasis)         Wheelchair     Assist Is the patient using a wheelchair?: Yes Type of Wheelchair: Manual Wheelchair activity did not occur: Safety/medical  concerns (NWB RLE, orthostasis,  unable to transfer)  Wheelchair assist level: Independent Max wheelchair distance: >16'    Wheelchair 50 feet with 2 turns activity    Assist    Wheelchair 50 feet with 2 turns activity did not occur: Safety/medical concerns (NWB RLE, orthostasis, unable to transfer)   Assist Level: Independent   Wheelchair 150 feet activity     Assist  Wheelchair 150 feet activity did not occur: Safety/medical concerns (NWB RLE, orthostasis, unable to transfer)   Assist Level: Independent   Blood pressure (!) 104/50, pulse (!) 59, temperature 98.1 F (36.7 C), temperature source Oral, resp. rate 17, height '5\' 8"'$  (1.727 m), weight 89.6 kg, SpO2 97 %.    Medical Problem List and Plan: 1.  Polytrauma with multiple fractures             -patient may shower but all incisions must be covered             -ELOS/Goals: DC 9/1, team conf today  -Continue CIR therapies including PT, OT   -may wear CAm boot in bed 2.  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 Post-operative pain:  Decrease '5mg'$  Norco to 1 tab q4 H PRN. -- Continue Tylenol PRN.   4. Mood: LCSW to follow for evaluation and support.              -antipsychotic agents: N/A 5. Neuropsych: This patient is capable of making decisions on her own behalf. 6. Skin/Wound Care: Monitor wounds daily for healing.  7. Fluids/Electrolytes/Nutrition: encourage PO      8.  Right ankle bimalleolar Fx s/p ORIF:  NWB RLE--CAM boot on when OOB.  9. Left patella Fx: WBAT with Timmothy Sours Joy brace locked in full extension.              --may remove brace in shower as long as knee kept in extension and brace replaced afterwards.   10. Acute blood loss anemia: Stable. Will recheck labs on Monday.  11. Anxiety: Improving with ego support.   12. Orthostatic hypotension: Monitor BP tid as well as orthostatic vitals.  --Wearing abdominal binder on chest--advised patient and PT to lower it a bit for better support of  BP --Continue midodrine and Florinef for BP support.  -8/30 bp's stable at present 13.Cellulitis right knee/ankle?: Resolved --sutures were removed  by ortho  14. Ileus: has resolved and is tolerating regular diet.   15. Obesity (BMI 30.03): provide dietary education as appropriate 16. Reflux: prn maalox ordered .     LOS: 8 days A FACE TO FACE EVALUATION WAS PERFORMED  Meredith Staggers 07/21/2021, 12:41 PM

## 2021-07-22 DIAGNOSIS — D62 Acute posthemorrhagic anemia: Secondary | ICD-10-CM

## 2021-07-22 DIAGNOSIS — F411 Generalized anxiety disorder: Secondary | ICD-10-CM

## 2021-07-22 NOTE — Progress Notes (Signed)
Inpatient Rehabilitation Care Coordinator Discharge Note   Patient Details  Name: Courtney Berg MRN: [id number] Date of Birth: 1956-08-21   Discharge location: HOME WITH HUSBAND WHO CAN ASSIST 24/7  Length of Stay:  10 DAYS  Discharge activity level: SUPERVISION-MIN ASSIST WHEELCHAIR LEVEL  Home/community participation: YES  Patient response [id number] Literacy - How often do you need to have someone help you when you read instructions, pamphlets, or other written material from your doctor or pharmacy?: Never  Patient response [id number] Isolation - How often do you feel lonely or isolated from those around you?: Never  Services provided included: MD, RD, PT, OT, RN, CM, Pharmacy, SW  Financial Services:  Financial Services Utilized: Worker's Sales promotion account executive offered to/list presented to: PT  Follow-up services arranged:  Home Health, DME Home Health Agency: [employer] ARRANGED PT & OT    DME : [employer] ARRANGED BARIATRIC DROP-ARM BEDSIDE COMMOE AND TRANSFER BOARD TO BE DELIVERED TO PT'S HOME 9/1    Patient response to transportation need: Is the patient able to respond to transportation needs?: Yes In the past 12 months, has lack of transportation kept you from medical appointments or from getting medications?: No In the past 12 months, has lack of transportation kept you from meetings, work, or from getting things needed for daily living?: No    Comments (or additional information):HUSBAND WAS Courtney Berg. AWARE OF EQUIPMENT BEING DELIVERED TO HOME AND HOME HEALTH AGENCY TO CONTACT HER REGARDING COMING OUT  Patient/Family verbalized understanding of follow-up arrangements:  Yes  Individual responsible for coordination of the follow-up plan: Courtney Berg  Confirmed correct DME delivered: Courtney Berg 07/22/2021    Courtney Berg, Courtney Berg

## 2021-07-22 NOTE — Progress Notes (Signed)
Occupational Therapy Discharge Summary  Patient Details  Name: Courtney Berg MRN: [id number] Date of Birth: [birth year]  Today's Date: 07/22/2021 OT Individual Time: 3614-4315 OT Individual Time Calculation (min): 54 min    Patient has met 8 of 10 long term goals due to improved activity tolerance, improved balance, postural control, and ability to compensate for deficits.  Patient to discharge at overall Supervision level.  Patient's care partner is independent to provide the necessary physical assistance at discharge.    Reasons goals not met: WB precautions on RLE + KI on LLE.   Recommendation:  Patient will benefit from ongoing skilled OT services in home health setting to continue to advance functional skills in the area of BADL, iADL, and Reduce care partner burden.  Equipment: Bariatric drop arm commode Sliding board   Reasons for discharge: treatment goals met and discharge from hospital  Patient/family agrees with progress made and goals achieved: Yes  Skilled OT intervention: Session focused on family education with pt's husband present. Provided skilled problem solving re home transfers, set up assist, fall risk reduction, and WB precautions. Explained CGA/ occasional min A with stand pivot transfers vs (S) with slideboard transfers. Reviewed equipment recommendations and fall recovery recommendations. Pt completed simulated toileting tasks with (S). Pt ended session with 200 ft of w/c propulsion to work on BUE endurance and strength. Pt transferred back to bed and was left supine with all needs met, bed alarm set.   OT Discharge Precautions/Restrictions  Precautions Precautions: Fall Precaution Comments: orthostatic hypotension Required Braces or Orthoses: Other Brace;Knee Immobilizer - Left Knee Immobilizer - Left: On at all times Other Brace: CAM walker boot RLE-when OOB- can be off in bed. Restrictions Weight Bearing Restrictions: Yes RLE Weight Bearing: Non  weight bearing LLE Weight Bearing: Weight bearing as tolerated Other Position/Activity Restrictions: Pt allowed to have Bledsoe unlocked and up to 30 degrees of flexion while nonweightbearing. Must be locked in extension while ambulating  Pain Pain Assessment Pain Scale: 0-10 Pain Score: 0-No pain Faces Pain Scale: No hurt ADL ADL Eating: Independent Where Assessed-Eating: Chair Grooming: Modified independent Where Assessed-Grooming: Sitting at sink Upper Body Bathing: Setup Where Assessed-Upper Body Bathing: Shower Lower Body Bathing: Supervision/safety Where Assessed-Lower Body Bathing: Shower Upper Body Dressing: Setup Where Assessed-Upper Body Dressing: Sitting at sink Lower Body Dressing: Minimal assistance Where Assessed-Lower Body Dressing: Sitting at sink Toileting: Minimal assistance Where Assessed-Toileting: Bedside Commode Toilet Transfer: Close supervision Toilet Transfer Method: Theatre manager: Extra wide drop arm bedside commode Tub/Shower Transfer: Close supervison Tub/Shower Transfer Method: Administrator, arts: Radio broadcast assistant Vision Baseline Vision/History: 1 Wears glasses Wears Glasses: Distance only Patient Visual Report: No change from baseline Vision Assessment?: No apparent visual deficits Perception  Perception: Within Functional Limits Praxis Praxis: Intact Cognition Overall Cognitive Status: Within Functional Limits for tasks assessed Arousal/Alertness: Awake/alert Orientation Level: Oriented X4 Year: 2022 Month: August Day of Week: Correct Attention: Selective Selective Attention: Appears intact Memory: Appears intact Immediate Memory Recall: Sock;Blue;Bed Memory Recall Sock: Without Cue Memory Recall Blue: Without Cue Memory Recall Bed: Without Cue Awareness: Appears intact Problem Solving: Appears intact Safety/Judgment: Appears intact Sensation Sensation Light Touch: Appears  Intact Coordination Gross Motor Movements are Fluid and Coordinated: No Fine Motor Movements are Fluid and Coordinated: Yes Coordination and Movement Description: NWB RLE, WBAT LLE locked in extension Finger Nose Finger Test: [facility] Heel Shin Test: NT Motor  Motor Motor: Within Functional Limits Motor - Discharge Observations: Pt limited by weightbearing precautions  Mobility  Bed Mobility Bed Mobility: Rolling Right;Rolling Left;Supine to Sit;Sitting - Scoot to [employer] of Bed;Sit to Supine;Scooting to [facility] Rolling Right: Independent with assistive device Rolling Left: Independent with assistive device Supine to Sit: Independent with assistive device (bedrail) Sitting - Scoot to Edge of Bed: Independent with assistive device Sit to Supine: Independent with assistive device Scooting to [place]: Independent with assistive device Transfers Sit to Stand: Contact Guard/Touching assist Stand to Sit: Contact Guard/Touching assist  Trunk/Postural Assessment  Cervical Assessment Cervical Assessment: Exceptions to [facility] (forward head) Thoracic Assessment Thoracic Assessment: Exceptions to [facility] (rounded shoulders) Lumbar Assessment Lumbar Assessment: Exceptions to [facility] (posterior pelvic tilt) Postural Control Postural Control: Within Functional Limits Protective Responses: NWB RLE, LLE locked in extension, orthostasis  Balance Balance Balance Assessed: Yes Static Sitting Balance Static Sitting - Balance Support: Feet supported;Bilateral upper extremity supported Static Sitting - Level of Assistance: 7: Independent Dynamic Sitting Balance Dynamic Sitting - Balance Support: Feet supported;During functional activity Dynamic Sitting - Level of Assistance: 5: Stand by assistance Sitting balance - Comments: BLEs extended, from elevated surface Static Standing Balance Static Standing - Balance Support: Bilateral upper extremity supported;During functional activity Static Standing - Level of Assistance: 5:  Stand by assistance Dynamic Standing Balance Dynamic Standing - Balance Support: Bilateral upper extremity supported;During functional activity Dynamic Standing - Level of Assistance: 4: Min assist Dynamic Standing - Balance Activities: Lateral lean/weight shifting;Forward lean/weight shifting;Reaching for objects Extremity/Trunk Assessment RUE Assessment RUE Assessment: Within Functional Limits LUE Assessment LUE Assessment: Within Functional Limits   Jenn Worischeck 07/22/2021, 12:38 PM

## 2021-07-22 NOTE — Progress Notes (Signed)
Recreational Therapy Session Note  Patient Details  Name: Courtney Berg MRN: [id number] Date of Birth: 1956-10-22 Today's Date: 07/22/2021 Pain:no c/o  Pt participated in animal assisted activity bed level with supervision.  Pt tearful talking about the recent loss of her horse during this visit and the impacts our pet have on [place].  Pt appreciate of the visit.[place] 07/22/2021, 3:43 PM
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
## 2021-07-22 NOTE — Progress Notes (Signed)
Occupational Therapy Session Note  Patient Details  Name: Courtney Berg MRN: [id number] Date of Birth: Apr 07, 1956  Today's Date: 07/22/2021 OT Individual Time: 6943-7005 OT Individual Time Calculation (min): 25 min    Short Term Goals: Week 1:  OT Short Term Goal 1 (Week 1): Pt will complete LB dressing with mod assist with AE as needed OT Short Term Goal 1 - Progress (Week 1): Met OT Short Term Goal 2 (Week 1): Pt will complete squat pivot transfers with min assist without change in BP OT Short Term Goal 2 - Progress (Week 1): Met OT Short Term Goal 3 (Week 1): Pt will complete sit > stand with min assist to prepare for LB dressing at sit > stand level OT Short Term Goal 3 - Progress (Week 1): Met  Skilled Therapeutic Interventions/Progress Updates:     Pt received in bed with knee pain. Ice provided and improved comfort.  Therapeutic exercise Focus of session on clarification/explanation on AROM restrictions with brace unlocked in bed up to 30 degress but pt able to demo ability to lock brace if transfering. Educated pt on risk of overuse injury and improving UE stretching: ant/post delt, forearm and cat cow. Pt abel to return demo all. b Pt left at end of session in bed with exit alarm on, call light in reach and all needs met   Therapy Documentation Precautions:  Precautions Precautions: Fall Precaution Comments: orthostatic hypotension Required Braces or Orthoses: Other Brace, Knee Immobilizer - Left (CAM boot on RLE, Bledsoe on LLE locked in extension while weightbearing) Knee Immobilizer - Left: On at all times Other Brace: CAM walker boot RLE-when OOB- can be off in bed. Restrictions Weight Bearing Restrictions: Yes RLE Weight Bearing: Non weight bearing LLE Weight Bearing: Weight bearing as tolerated Other Position/Activity Restrictions: Pt allowed to have Bledsoe unlocked and up to 30 degrees of flexion while nonweightbearing. Must be locked in extension while  ambulating General:   Vital Signs: Therapy Vitals Temp: 98.8 F (37.1 C) Temp Source: Oral Pulse Rate: 64 Resp: 18 BP: (!) 101/42 Patient Position (if appropriate): Sitting Oxygen Therapy SpO2: 93 % O2 Device: Room Air Pain:   ADL: ADL Upper Body Bathing: Setup Where Assessed-Upper Body Bathing: Shower (roll-in shower chair) Lower Body Bathing: Dependent Where Assessed-Lower Body Bathing: Shower Upper Body Dressing: Minimal assistance Where Assessed-Upper Body Dressing: Chair Lower Body Dressing: Unable to assess Toilet Transfer: Minimal assistance Toilet Transfer Method: Sit pivot (lateral scoot) Toilet Transfer Equipment:  (roll in shower chair/commode) Vision   Perception    Praxis   Exercises:   Other Treatments:     Therapy/Group: Individual Therapy  [name] 07/22/2021, 7:00 AM

## 2021-07-22 NOTE — Progress Notes (Addendum)
Patient ID: Courtney Berg, female   DOB: 25-Nov-1955, 65 y.o.   MRN: [id number]  Spoke with Susan-Workers [employer] CM to see if received equipment orders and home health orders. She had and was working on this. She reports DME company will contact pt to set up delivery for equipment to her home tomorrow and also set up home health appointments. Will let pt know of both of these.  12:00 Pt aware and has spoken with Manuela Schwartz CM Workers [employer] herself and aware of plans-home health and DME. Husband was here for education and ready for discharge tomorrow.

## 2021-07-22 NOTE — Progress Notes (Signed)
Occupational Therapy Session Note  Patient Details  Name: Courtney Berg MRN: [id number] Date of Birth: February 02, 1956  Today's Date: 07/22/2021 OT Individual Time: 1300-1407 OT Individual Time Calculation (min): 67 min    Short Term Goals: Week 2:  OT Short Term Goal 1 (Week 2): STGs = LTGs d/t ELOS  Skilled Therapeutic Interventions/Progress Updates:  Pt greeted supine in bed  agreeable to OT intervention. Session focus on BADL reeducation. Pt currently completes bed mobility MOD I with use bed features. MIN A for SPT from EOB>bari BSC with RW from heavily elevated EOB. Pt completed toileting with set- up assist for hygiene via lateral leans. Pt completed additional stand pivot transfer from BSC>w/c with MIN A. Pt completed w/c mobility from room to shower room with supervision. Pt completed lateral scoot transfer from w/c>TTB with MIN A with pt needing most assist to scoot over wheel on w/c. Pt completed bathing from TTB with overall set- up assist. Pt exited shower in similar fashion as previously indicated. Pt completed dressing from w/c with set- up assist for UB dressing and MIN A for LB dressing as pt needed to sit<>stand from w/c to pull pants up to waist line. Pt completed grooming tasks at sink with set- up assist. Pt completed w/c propulsion back to room with supervision where pt completed SB transfer back to bed with pt needing set- up assist to place SB and CGA to scoot from w/c>EOB. Pt left supine in bed with bed alarm activated and all needs within reach.                  Therapy Documentation Precautions:  Precautions Precautions: Fall Precaution Comments: orthostatic hypotension Required Braces or Orthoses: Other Brace, Knee Immobilizer - Left Knee Immobilizer - Left: On at all times Other Brace: CAM walker boot RLE-when OOB- can be off in bed. Restrictions Weight Bearing Restrictions: Yes RLE Weight Bearing: Non weight bearing LLE Weight Bearing: Weight bearing as  tolerated Other Position/Activity Restrictions: Pt allowed to have Bledsoe unlocked and up to 30 degrees of flexion while nonweightbearing. Must be locked in extension while ambulating  Pain: Pt reports no pain during session.     Therapy/Group: Individual Therapy  [name] 07/22/2021, 3:43 PM

## 2021-07-22 NOTE — Progress Notes (Signed)
Physical Therapy Session Note  Patient Details  Name: Courtney Berg MRN: [id number] Date of Birth: 11-Mar-1956  Today's Date: 07/22/2021 PT Individual Time: 0800-0854 PT Individual Time Calculation (min): 54 min   Short Term Goals: Week 1:  PT Short Term Goal 1 (Week 1): Pt will perform bed mobility w/supervision for improved functional mobility PT Short Term Goal 1 - Progress (Week 1): Met PT Short Term Goal 2 (Week 1): Pt will perform sit <>stand w/LRAD and CGA while maintaining WB precautions PT Short Term Goal 2 - Progress (Week 1): Progressing toward goal PT Short Term Goal 3 (Week 1): Pt will perform bed <>chair transfers w/min A and LRAD PT Short Term Goal 3 - Progress (Week 1): Met Week 2:  PT Short Term Goal 1 (Week 2): STG = LTG due to LOS  Skilled Therapeutic Interventions/Progress Updates:   Received pt semi-reclined in bed with husband present at bedside and RN present administering medication. Pt agreeable to PT treatment and denied any pain during session. Pt's husband brought her WC from home and therapist deflated Roho cushion and adjusted anti-tippers for comfort/safety. Session with emphasis on family education, functional mobility/transfers, generalized strengthening, dynamic standing balance/coordination, simulated car transfers, and improved activity tolerance. Removed Bledsoe brace and donned L ted hose with total A and re-locked brace into extension and pt performed bed mobility mod I with HOB elevated and use of bedrails. Pt transferred bed<>WC stand<>pivot with RW and CGA from elevated bed -pt demonstrating good adherence to RLE NWB precautions. Pt's current WC does not have elevating legrest for LLE therefore used slideboard as temporary legrest. Pt performed WC mobility 190f x 2 and 739fx 2 using BUE mod I to/from 70M ortho gym. Discussed car transfer technique with pt reporting she has a pickup truck with a seat height of 36in and a running board but her husband also  has a WC accessible van. Encouraged pt/husband to use vaLucianne Leiue to safety concerns of getting in truck (especially with stepping onto running board with current WB precautions) and both in agreement. Pt also reports her mother in law has a small car that she could temporarily use. Therefore performed simulated car transfer using slideboard with supervision overall with pt's husband placing/removing board. Pt required cues for safe entry as pt initially attempting to put both LEs into car before transferring. Pt also required cues for set up getting WC as close to car as possible. Also discussed DME, pt reported having RW but concerned about not having drop arm commode. Confirmed with CSW that pt will receive drop arm commode and slideboard for discharge. [facility] reach and husband present at bedside awaiting OT session.   Therapy Documentation Precautions:  Precautions Precautions: Fall Precaution Comments: orthostatic hypotension Required Braces or Orthoses: Other Brace, Knee Immobilizer - Left (CAM boot on RLE, Bledsoe on LLE locked in extension while weightbearing) Knee Immobilizer - Left: On at all times Other Brace: CAM walker boot RLE-when OOB- can be off in bed. Restrictions Weight Bearing Restrictions: Yes RLE Weight Bearing: Non weight bearing LLE Weight Bearing: Weight bearing as tolerated Other Position/Activity Restrictions: Pt allowed to have Bledsoe unlocked and up to 30 degrees of flexion while nonweightbearing. Must be locked in extension while ambulating  Therapy/Group: Individual Therapy AnAlfonse AlpersT, DPT   07/22/2021, 7:17 AM

## 2021-07-23 ENCOUNTER — Other Ambulatory Visit (HOSPITAL_COMMUNITY): Payer: Self-pay

## 2021-07-23 DIAGNOSIS — I82A13 Acute embolism and thrombosis of axillary vein, bilateral: Secondary | ICD-10-CM

## 2021-07-23 MED ORDER — APIXABAN 5 MG PO TABS: 5.0000 mg | ORAL_TABLET | Freq: Two times a day (BID) | ORAL | 0 refills | Status: DC

## 2021-07-23 MED ORDER — POLYETHYLENE GLYCOL 3350 17 G PO PACK: 17.0000 g | PACK | Freq: Every day | ORAL | 0 refills | Status: AC | PRN

## 2021-07-23 MED ORDER — VITAMIN D3 25 MCG PO TABS: 2000.0000 [IU] | ORAL_TABLET | Freq: Every day | ORAL | 0 refills | Status: AC

## 2021-07-23 MED ORDER — POLYSACCHARIDE IRON COMPLEX 150 MG PO CAPS: 150.0000 mg | ORAL_CAPSULE | Freq: Every day | ORAL | 0 refills | Status: DC

## 2021-07-23 MED ORDER — METOCLOPRAMIDE HCL 5 MG PO TABS: 5.0000 mg | ORAL_TABLET | Freq: Three times a day (TID) | ORAL | 0 refills | Status: DC

## 2021-07-23 MED ORDER — METHOCARBAMOL 500 MG PO TABS: 500.0000 mg | ORAL_TABLET | Freq: Four times a day (QID) | ORAL | 0 refills | Status: DC | PRN

## 2021-07-23 MED ORDER — FLUDROCORTISONE ACETATE 0.1 MG PO TABS: 0.1000 mg | ORAL_TABLET | Freq: Every day | ORAL | 0 refills | Status: DC

## 2021-07-23 MED ORDER — HYDROCODONE-ACETAMINOPHEN 5-325 MG PO TABS: 1.0000 | ORAL_TABLET | Freq: Every day | ORAL | 0 refills | Status: DC | PRN

## 2021-07-23 MED ORDER — MIDODRINE HCL 10 MG PO TABS: 10.0000 mg | ORAL_TABLET | Freq: Three times a day (TID) | ORAL | 0 refills | Status: DC

## 2021-07-23 NOTE — Discharge Summary (Signed)
Physician Discharge Summary  Patient ID: Courtney Berg MRN: [id number] DOB/AGE: 64/04/1956 64 y.o.  Admit date: 07/13/2021 Discharge date: 07/23/2021  Discharge Diagnoses:  Principal Problem:   Closed right ankle fracture Active Problems:   Left patella fracture   Orthostatic hypotension   Generalized anxiety disorder   Acute blood loss anemia   DVT, bilateral lower limbs (HCC)   Discharged Condition: good  Significant Diagnostic Studies:  DG Knee 1-2 Views Left  Result Date: 07/16/2021 CLINICAL DATA:  ORIF patellar fracture. EXAM: LEFT KNEE - 1-2 VIEW COMPARISON:  06/26/2021 FINDINGS: Post sideplate fixation of the patella. Alignment appears near anatomic. No evidence of hardware failure or loosening. There is a minimal amount of expected adjacent soft tissue swelling. Small knee joint effusion. No evidence of lipohemarthrosis. Joint spaces are preserved. IMPRESSION: Post sideplate fixation of the patella without evidence of complication. Electronically Signed   By: Sandi Mariscal M.D.   On: 07/16/2021 09:30   DG Ankle Complete Right  Result Date: 07/16/2021 CLINICAL DATA:  ORIF right ankle fracture. EXAM: RIGHT ANKLE - COMPLETE 3+ VIEW COMPARISON:  07/01/2021; 06/26/2021 FINDINGS: Stable sequela of ORIF of trimalleolar ankle fracture including sideplate fixation of the fibula, suture repair of the syndesmosis and cancellous fixation of posterior malleolus. Alignment appears anatomic. No evidence of hardware failure or loosening. Joint spaces are preserved. The ankle mortise is preserved. Very minimal amount of expected adjacent soft tissue swelling. Moderate to large sized plantar calcaneal spur. IMPRESSION: Post ORIF trimalleolar ankle fracture and suture repair of the syndesmosis without evidence of hardware failure or loosening. Electronically Signed   By: Sandi Mariscal M.D.   On: 07/16/2021 09:29    DG Abd 1 View  Result Date: 07/13/2021 CLINICAL DATA:  Liquid stool EXAM: ABDOMEN -  1 VIEW COMPARISON:  None. FINDINGS: Scattered gas demonstrated throughout the colon and small bowel. No small or large bowel distention. Changes likely represent ileus. No radiopaque stones. Degenerative changes in the spine and hips. IMPRESSION: Gas-filled nondistended small and large bowel, likely ileus. Electronically Signed   By: Lucienne Capers M.D.   On: 07/13/2021 19:37   CDG Abd Portable 1V  Result Date: 07/15/2021 CLINICAL DATA:  Diarrhea. EXAM: PORTABLE ABDOMEN - 1 VIEW COMPARISON:  July 13, 2021. FINDINGS: The bowel gas pattern is normal. No radio-opaque calculi or other significant radiographic abnormality are seen. IMPRESSION: Negative. Electronically Signed   By: Marijo Conception M.D.   On: 07/15/2021 08:29   VAS [place] LOWER EXTREMITY VENOUS (DVT)  Result Date: 07/15/2021  Lower Venous DVT Study Patient Name:  Courtney Berg  Date of Exam:   07/14/2021 Medical Rec #: [id number]        Accession #:    [id number] Date of Birth: 1956-10-25       Patient Gender: F Patient Age:   65 years Exam Location:  [facility] Procedure:      VAS [place] LOWER EXTREMITY VENOUS (DVT) Referring Phys: Kee Drudge --------------------------------------------------------------------------------  Indications: Swelling.  Risk Factors: None identified Trauma. Comparison Study: No prior studies. Performing Technologist: Maudry Mayhew MHA, RDMS, RVT, RDCS  Examination Guidelines: A complete evaluation includes B-mode imaging, spectral Doppler, color Doppler, and power Doppler as needed of all accessible portions of each vessel. Bilateral testing is considered an integral part of a complete examination. Limited examinations for reoccurring indications may be performed as noted. The reflux portion of the exam is performed with the patient in reverse Trendelenburg.  +---------+---------------+---------+-----------+----------+--------------+ RIGHT    CompressibilityPhasicitySpontaneityPropertiesThrombus  Aging  +---------+---------------+---------+-----------+----------+--------------+ CFV      Full           Yes      Yes                                 +---------+---------------+---------+-----------+----------+--------------+ SFJ      Full                                                        +---------+---------------+---------+-----------+----------+--------------+ FV Prox  Full                                                        +---------+---------------+---------+-----------+----------+--------------+ FV Mid   Full                                                        +---------+---------------+---------+-----------+----------+--------------+ FV DistalFull                                                        +---------+---------------+---------+-----------+----------+--------------+ PFV      Full                                                        +---------+---------------+---------+-----------+----------+--------------+ POP      Full           Yes      Yes                                 +---------+---------------+---------+-----------+----------+--------------+ PTV      Full                                                        +---------+---------------+---------+-----------+----------+--------------+ PERO     Partial                                      Acute          +---------+---------------+---------+-----------+----------+--------------+ Gastroc  Partial                                      Acute          +---------+---------------+---------+-----------+----------+--------------+   +---------+---------------+---------+-----------+----------+--------------+ LEFT     CompressibilityPhasicitySpontaneityPropertiesThrombus Aging +---------+---------------+---------+-----------+----------+--------------+  CFV                     Yes      Yes                  Acute           +---------+---------------+---------+-----------+----------+--------------+ FV Prox                 Yes      Yes                  Acute          +---------+---------------+---------+-----------+----------+--------------+ FV Mid                                                Acute          +---------+---------------+---------+-----------+----------+--------------+ FV Distal               No       No                   Acute          +---------+---------------+---------+-----------+----------+--------------+ POP                     No       No                   Acute          +---------+---------------+---------+-----------+----------+--------------+ PTV      Full                                                        +---------+---------------+---------+-----------+----------+--------------+ PERO     None                                         Acute          +---------+---------------+---------+-----------+----------+--------------+     Summary: RIGHT: - Findings consistent with acute deep vein thrombosis involving the right peroneal veins, and right gastrocnemius veins. - No cystic structure found in the popliteal fossa.  LEFT: - Findings consistent with acute deep vein thrombosis involving the left common femoral vein, left femoral vein, left popliteal vein, and left peroneal veins. Thrombus in the common femoral vein is slightly mobile. - No cystic structure found in the popliteal fossa. Unable to perform all compression maneuvers due to mobile thrombus.  *See table(s) above for measurements and observations. Electronically signed by Deitra Mayo MD on 07/15/2021 at 7:17:02 AM.    Final     Labs:  Basic Metabolic Panel: BMP Latest Ref Rng & Units 07/20/2021 07/14/2021 07/13/2021  Glucose 70 - 99 mg/dL 97 98 104(H)  BUN 8 - 23 mg/dL '12 11 11  '$ Creatinine 0.44 - 1.00 mg/dL 0.62 0.91 0.89  BUN/Creat Ratio 6 - 22 (calc) - - -  Sodium 135 - 145 mmol/L 138 136 134(L)   Potassium 3.5 - 5.1 mmol/L 3.7 4.4 3.8  Chloride 98 - 111 mmol/L 103 102 100  CO2 22 - 32 mmol/L 27  27 24  Calcium 8.9 - 10.3 mg/dL 8.9 9.3 9.2     CBC: CBC Latest Ref Rng & Units 07/20/2021 07/14/2021 07/11/2021  WBC 4.0 - 10.5 K/uL 5.4 9.1 10.2  Hemoglobin 12.0 - 15.0 g/dL 10.3(L) 10.4(L) 9.4(L)  Hematocrit 36.0 - 46.0 % 32.1(L) 31.7(L) 28.9(L)  Platelets 150 - 400 K/uL 449(H) 417(H) 333     CBG: No results for input(s): GLUCAP in the last 168 hours.  Brief HPI:   Courtney Berg is a 65 y.o. female in relatively good health who was admitted on 06/26/2021 after tripping and falling off 3 stairs.  She sustained a right bimalleolar ankle fracture and left patella fracture which was splinted and trauma Ortho consulted for input.  She was taking to the OR for ORIF right bimalleolar ankle fracture with repair of ankle syndesmosis and ORIF left patella on 08/10 Dr. Doreatha Martin.  Postop WBAT on LLE with knee locked in extension and NWB RLE with Lovenox for DVT prophylaxis.  Knee brace to be worn at all times and CAM boot to be worn when out of bed.  Hospital course was significant for issues with constipation, urinary retention, stress-induced hyperglycemia as well as ongoing issues with hypotension.  Midodrine and Florinef was added for BP support with improvement in symptoms.  She was also noted to have issues with leukocytosis as well as erythema surrounding right ankle.  Blood cultures x2 were negative and she was started on Septra x5 days for wound prophylaxis.  Pain was controlled however she continued to have issues with diarrhea after meals as well as dizziness with orthostatic changes.  Therapy was ongoing and CIR was recommended due to functional decline.   Hospital Course: Courtney Berg was admitted to rehab 07/13/2021 for inpatient therapies to consist of PT and OT at least three hours five days a week. Past admission physiatrist, therapy team and rehab RN have worked together to provide  customized collaborative inpatient rehab. She reported issues with diarrhea with every meals and KUB done revealing ileus. She was made NPO and started on reglan with improvement in her symptoms and diet was advanced to regular by 08/25.  Surveillance  BLE dopplers were done past  revealing BLE DVTs including slightly mobile thrombus in L-CFV. She was started on Eliquis treatment dose and placed on bed rest till 08/25 prior to resuming full therapies.  Follow up CBC showed that ABLA is resolving. Serial check of BMET showed that hyponatremia has resolved and renal status is stable.  / Right ankle erythema has resolved and  she completed her antibiotic course on 08/23. Follow up Xrays of right ankle and bilateral knees showed that hardware was intact. She continued to have issues with dizziness due to orthostatic changes and TEDs and abdominal binder used for BP support. She was encourage to increase fluid intake also. As activity tolerance and endurance improved, BP has stabilized and she is not requiring binder for support. recommend that PCP wean florinef and midodrine on outpatient basis. Follow up serial BMET showed that transient hyponatremia has resolved and renal status is stable. Dizziness has resolved and she has made steady gains during her stay. She has progressed to supervision at wheelchair level and will continue to receive follow up [facility] therapies after discharge.    Rehab course: During patient's stay in rehab weekly team conferences were held to monitor patient's progress, set goals and discuss barriers to discharge. At admission, patient required min to total assist with ADL tasks and min  assist with mobility.  She  has had improvement in activity tolerance, balance, postural control as well as ability to compensate for deficits. She is able to complete ADL tasks with supervision. She requires CGA for stand pivot transfers and is able to propel her wheelchair for >150' at modified independent  level. Family education was completed with [employer].   Discharge disposition: 01-Home or Self Care  Diet:  Regular  Special Instructions: No weight on RLE.  Need to have CAM boot when out of bed. Weight bear as tolerated on LLE for transfers only.  Knee brace needs to be locked when out of bed--ok for 30 degree ROM/flexion when in bed PCP to wean off florinef and midodrine on outpatient basisl   Allergies as of 07/23/2021   No Known Allergies      Medication List     STOP taking these medications    ondansetron 4 MG tablet Commonly known as: ZOFRAN   senna-docusate 8.6-50 MG tablet Commonly known as: Senokot-S       TAKE these medications    acetaminophen 325 MG tablet Commonly known as: TYLENOL Take 650 mg by mouth every 6 (six) hours as needed for moderate pain or headache.   apixaban 5 MG Tabs tablet Commonly known as: ELIQUIS Take 1 tablet (5 mg total) by mouth 2 (two) times daily.   cyanocobalamin 1000 MCG tablet Take 1 tablet (1,000 mcg total) by mouth daily.   fludrocortisone 0.1 MG tablet Commonly known as: FLORINEF Take 1 tablet (0.1 mg total) by mouth daily. Start taking on: July 24, 2021   HYDROcodone-acetaminophen 5-325 MG tablet--Rx# 5 pills Commonly known as: NORCO/VICODIN Take 1 tablet by mouth daily as needed for moderate pain. What changed:  how much to take when to take this reasons to take this Notes to patient: For moderate to severe pain--can use 1/2 to one tab. Last used on 08/30   iron polysaccharides 150 MG capsule Commonly known as: NIFEREX Take 1 capsule (150 mg total) by mouth daily. Notes to patient: available OTC   methocarbamol 500 MG tablet Commonly known as: ROBAXIN Take 1 tablet (500 mg total) by mouth every 6 (six) hours as needed for muscle spasms.   metoCLOPramide 5 MG tablet Commonly known as: REGLAN Take 1 tablet (5 mg total) by mouth 3 (three) times daily before meals. Notes to patient: After 5 days decrease  to twice a day. Then 5 days later decrease to once a day for few days then discontinue. If GI symptoms recur go back to prior dose and follow up with PCP for instructions   midodrine 10 MG tablet Commonly known as: PROAMATINE Take 1 tablet (10 mg total) by mouth 3 (three) times daily with meals.   polyethylene glycol 17 g packet Commonly known as: MIRALAX / GLYCOLAX Take 17 g by mouth daily as needed. What changed:  when to take this reasons to take this   Vitamin D3 25 MCG tablet Commonly known as: Vitamin D Take 2 tablets (2,000 Units total) by mouth daily. Notes to patient: Available OTC        Follow-up Information     Raulkar, Clide Deutscher, MD Follow up.   Specialty: Physical Medicine and Rehabilitation Why: 08/12/21 please arrive at 1:00pm for 1:20pm appointment, thank you! Contact information: [id number] [place] [address] [phone]         Susy Frizzle, MD. Call.   Specialty: Family Medicine Why: for post hospital follow up Contact  information: [address] [place] [address] [phone]         Shona Needles, MD. Call.   Specialty: Orthopedic Surgery Why: for post op check Contact information: [place] [address] [phone]                 Signed: Bary Leriche 07/25/2021, 10:51 PM
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
## 2021-07-23 NOTE — Progress Notes (Signed)
Patient dc'd home, dc instructions provided via pa. Pt wheeled down via wc.  No distress noted.

## 2021-07-25 DIAGNOSIS — I82403 Acute embolism and thrombosis of unspecified deep veins of lower extremity, bilateral: Secondary | ICD-10-CM

## 2021-07-31 ENCOUNTER — Telehealth: Payer: Self-pay | Admitting: Family Medicine

## 2021-07-31 DIAGNOSIS — S9304XA Dislocation of right ankle joint, initial encounter: Secondary | ICD-10-CM

## 2021-07-31 DIAGNOSIS — S82201S Unspecified fracture of shaft of right tibia, sequela: Secondary | ICD-10-CM

## 2021-07-31 DIAGNOSIS — S82841A Displaced bimalleolar fracture of right lower leg, initial encounter for closed fracture: Secondary | ICD-10-CM

## 2021-07-31 DIAGNOSIS — S82891S Other fracture of right lower leg, sequela: Secondary | ICD-10-CM

## 2021-07-31 NOTE — Telephone Encounter (Signed)
Referral orders placed from hospital for S/P ORIF of L knee and R ankle.   HPI: Courtney Berg. [name] is a 65 year old female in relatively good health who was admitted on 06/26/2021 after tripping and falling off 3 stairs.  She sustained a right bimalleolar ankle fracture and left patella fracture which was splinted and trauma Ortho consulted for input.  She was taken to the OR for ORIF right bimalleolar ankle fracture with repair of ankle syndesmosis and ORIF left patella on 08/10 by Dr. Doreatha Martin.  Postop to be WBAT on LLE with knee locked in extension and NWB RLE with Lovenox for DVT prophylaxis.  Bledsoe brace to be worn at all times and CAM boot on RLE when OOB.  Ok to re-order OT?

## 2021-07-31 NOTE — Telephone Encounter (Signed)
Received call from [facility] with [facility] to follow up on patient's referral for physical and occupational therapy. Patient receiving physical therapy through them; stated they don't have anyone in the area to do occupational therapy. Referral needs to be sent elsewhere. Please advise at [phone].

## 2021-07-31 NOTE — Telephone Encounter (Signed)
Referral orders for OT placed as patient does not require new referral for PT.

## 2021-08-06 ENCOUNTER — Ambulatory Visit: Payer: 59 | Admitting: Family Medicine

## 2021-08-06 ENCOUNTER — Other Ambulatory Visit: Payer: Self-pay

## 2021-08-06 VITALS — BP 128/66 | HR 68 | Temp 98.1°F | Resp 16

## 2021-08-06 DIAGNOSIS — I824Y1 Acute embolism and thrombosis of unspecified deep veins of right proximal lower extremity: Secondary | ICD-10-CM | POA: Diagnosis not present

## 2021-08-06 DIAGNOSIS — S82201S Unspecified fracture of shaft of right tibia, sequela: Secondary | ICD-10-CM

## 2021-08-06 DIAGNOSIS — I951 Orthostatic hypotension: Secondary | ICD-10-CM

## 2021-08-06 DIAGNOSIS — S82002S Unspecified fracture of left patella, sequela: Secondary | ICD-10-CM

## 2021-08-06 DIAGNOSIS — S82401S Unspecified fracture of shaft of right fibula, sequela: Secondary | ICD-10-CM

## 2021-08-06 MED ORDER — APIXABAN 5 MG PO TABS: 5.0000 mg | ORAL_TABLET | Freq: Two times a day (BID) | ORAL | 4 refills | Status: DC

## 2021-08-06 MED ORDER — CEPHALEXIN 500 MG PO CAPS: 500.0000 mg | ORAL_CAPSULE | Freq: Three times a day (TID) | ORAL | 0 refills | Status: DC

## 2021-08-06 NOTE — Progress Notes (Signed)
Subjective:    Patient ID: Courtney Berg, female    DOB: January 20, 1956, 65 y.o.   MRN: [id number]  HPI Admit date: 07/13/2021 Discharge date: 07/23/2021   Discharge Diagnoses:  Principal Problem:   Closed right ankle fracture Active Problems:   Left patella fracture   Orthostatic hypotension   Generalized anxiety disorder   Acute blood loss anemia   DVT, bilateral lower limbs (HCC)     Discharged Condition: good   Significant Diagnostic Studies:   DG Knee 1-2 Views Left   Result Date: 07/16/2021 CLINICAL DATA:  ORIF patellar fracture. EXAM: LEFT KNEE - 1-2 VIEW COMPARISON:  06/26/2021 FINDINGS: Post sideplate fixation of the patella. Alignment appears near anatomic. No evidence of hardware failure or loosening. There is a minimal amount of expected adjacent soft tissue swelling. Small knee joint effusion. No evidence of lipohemarthrosis. Joint spaces are preserved. IMPRESSION: Post sideplate fixation of the patella without evidence of complication. Electronically Signed   By: Sandi Mariscal M.D.   On: 07/16/2021 09:30    DG Ankle Complete Right   Result Date: 07/16/2021 CLINICAL DATA:  ORIF right ankle fracture. EXAM: RIGHT ANKLE - COMPLETE 3+ VIEW COMPARISON:  07/01/2021; 06/26/2021 FINDINGS: Stable sequela of ORIF of trimalleolar ankle fracture including sideplate fixation of the fibula, suture repair of the syndesmosis and cancellous fixation of posterior malleolus. Alignment appears anatomic. No evidence of hardware failure or loosening. Joint spaces are preserved. The ankle mortise is preserved. Very minimal amount of expected adjacent soft tissue swelling. Moderate to large sized plantar calcaneal spur. IMPRESSION: Post ORIF trimalleolar ankle fracture and suture repair of the syndesmosis without evidence of hardware failure or loosening. Electronically Signed   By: Sandi Mariscal M.D.   On: 07/16/2021 09:29      DG Abd 1 View   Result Date: 07/13/2021 CLINICAL DATA:  Liquid stool  EXAM: ABDOMEN - 1 VIEW COMPARISON:  None. FINDINGS: Scattered gas demonstrated throughout the colon and small bowel. No small or large bowel distention. Changes likely represent ileus. No radiopaque stones. Degenerative changes in the spine and hips. IMPRESSION: Gas-filled nondistended small and large bowel, likely ileus. Electronically Signed   By: Lucienne Capers M.D.   On: 07/13/2021 19:37    CDG Abd Portable 1V   Result Date: 07/15/2021 CLINICAL DATA:  Diarrhea. EXAM: PORTABLE ABDOMEN - 1 VIEW COMPARISON:  July 13, 2021. FINDINGS: The bowel gas pattern is normal. No radio-opaque calculi or other significant radiographic abnormality are seen. IMPRESSION: Negative. Electronically Signed   By: Marijo Conception M.D.   On: 07/15/2021 08:29    VAS [place] LOWER EXTREMITY VENOUS (DVT)   Result Date: 07/15/2021  Lower Venous DVT Study Patient Name:  Courtney Berg  Date of Exam:   07/14/2021 Medical Rec #: [id number]        Accession #:    [id number] Date of Birth: [birth year]       Patient Gender: F Patient Age:   84 years Exam Location:  [facility] Procedure:      VAS [place] LOWER EXTREMITY VENOUS (DVT) Referring Phys: PAMELA LOVE --------------------------------------------------------------------------------  Indications: Swelling.  Risk Factors: None identified Trauma. Comparison Study: No prior studies. Performing Technologist: Maudry Mayhew MHA, RDMS, RVT, RDCS  Examination Guidelines: A complete evaluation includes B-mode imaging, spectral Doppler, color Doppler, and power Doppler as needed of all accessible portions of each vessel. Bilateral testing is considered an integral part of a complete examination. Limited examinations for reoccurring indications may be performed  as noted. The reflux portion of the exam is performed with the patient in reverse Trendelenburg.  +---------+---------------+---------+-----------+----------+--------------+ RIGHT     CompressibilityPhasicitySpontaneityPropertiesThrombus Aging +---------+---------------+---------+-----------+----------+--------------+ CFV      Full           Yes      Yes                                 +---------+---------------+---------+-----------+----------+--------------+ SFJ      Full                                                        +---------+---------------+---------+-----------+----------+--------------+ FV Prox  Full                                                        +---------+---------------+---------+-----------+----------+--------------+ FV Mid   Full                                                        +---------+---------------+---------+-----------+----------+--------------+ FV DistalFull                                                        +---------+---------------+---------+-----------+----------+--------------+ PFV      Full                                                        +---------+---------------+---------+-----------+----------+--------------+ POP      Full           Yes      Yes                                 +---------+---------------+---------+-----------+----------+--------------+ PTV      Full                                                        +---------+---------------+---------+-----------+----------+--------------+ PERO     Partial                                      Acute          +---------+---------------+---------+-----------+----------+--------------+ Gastroc  Partial  Acute          +---------+---------------+---------+-----------+----------+--------------+   +---------+---------------+---------+-----------+----------+--------------+ LEFT     CompressibilityPhasicitySpontaneityPropertiesThrombus Aging +---------+---------------+---------+-----------+----------+--------------+ CFV                     Yes      Yes                   Acute          +---------+---------------+---------+-----------+----------+--------------+ FV Prox                 Yes      Yes                  Acute          +---------+---------------+---------+-----------+----------+--------------+ FV Mid                                                Acute          +---------+---------------+---------+-----------+----------+--------------+ FV Distal               No       No                   Acute          +---------+---------------+---------+-----------+----------+--------------+ POP                     No       No                   Acute          +---------+---------------+---------+-----------+----------+--------------+ PTV      Full                                                        +---------+---------------+---------+-----------+----------+--------------+ PERO     None                                         Acute          +---------+---------------+---------+-----------+----------+--------------+     Summary: RIGHT: - Findings consistent with acute deep vein thrombosis involving the right peroneal veins, and right gastrocnemius veins. - No cystic structure found in the popliteal fossa.  LEFT: - Findings consistent with acute deep vein thrombosis involving the left common femoral vein, left femoral vein, left popliteal vein, and left peroneal veins. Thrombus in the common femoral vein is slightly mobile. - No cystic structure found in the popliteal fossa. Unable to perform all compression maneuvers due to mobile thrombus.  *See table(s) above for measurements and observations. Electronically signed by Deitra Mayo MD on 07/15/2021 at 7:17:02 AM.    Final      Labs:  Basic Metabolic Panel: BMP Latest Ref Rng & Units 07/20/2021 07/14/2021 07/13/2021  Glucose 70 - 99 mg/dL 97 98 104(H)  BUN 8 - 23 mg/dL '12 11 11  '$ Creatinine 0.44 - 1.00 mg/dL 0.62 0.91 0.89  BUN/Creat Ratio 6 - 22 (calc) - - -  Sodium 135 - 145  mmol/L 138 136 134(L)  Potassium  3.5 - 5.1 mmol/L 3.7 4.4 3.8  Chloride 98 - 111 mmol/L 103 102 100  CO2 22 - 32 mmol/L '27 27 24  '$ Calcium 8.9 - 10.3 mg/dL 8.9 9.3 9.2      CBC: CBC Latest Ref Rng & Units 07/20/2021 07/14/2021 07/11/2021  WBC 4.0 - 10.5 K/uL 5.4 9.1 10.2  Hemoglobin 12.0 - 15.0 g/dL 10.3(L) 10.4(L) 9.4(L)  Hematocrit 36.0 - 46.0 % 32.1(L) 31.7(L) 28.9(L)  Platelets 150 - 400 K/uL 449(H) 417(H) 333      CBG: Last Labs   No results for input(s): GLUCAP in the last 168 hours.     Brief HPI:   Courtney Berg is a 65 y.o. female in relatively good health who was admitted on 06/26/2021 after tripping and falling off 3 stairs.  She sustained a right bimalleolar ankle fracture and left patella fracture which was splinted and trauma Ortho consulted for input.  She was taking to the OR for ORIF right bimalleolar ankle fracture with repair of ankle syndesmosis and ORIF left patella on 08/10 Dr. Doreatha Martin.  Postop WBAT on LLE with knee locked in extension and NWB RLE with Lovenox for DVT prophylaxis.  Knee brace to be worn at all times and CAM boot to be worn when out of bed.   Hospital course was significant for issues with constipation, urinary retention, stress-induced hyperglycemia as well as ongoing issues with hypotension.  Midodrine and Florinef was added for BP support with improvement in symptoms.  She was also noted to have issues with leukocytosis as well as erythema surrounding right ankle.  Blood cultures x2 were negative and she was started on Septra x5 days for wound prophylaxis.  Pain was controlled however she continued to have issues with diarrhea after meals as well as dizziness with orthostatic changes.  Therapy was ongoing and CIR was recommended due to functional decline.     Hospital Course: ANGELROSE FAGUNDO was admitted to rehab 07/13/2021 for inpatient therapies to consist of PT and OT at least three hours five days a week. Past admission physiatrist, therapy team and  rehab RN have worked together to provide customized collaborative inpatient rehab. She reported issues with diarrhea with every meals and KUB done revealing ileus. She was made NPO and started on reglan with improvement in her symptoms and diet was advanced to regular by 08/25.  Surveillance  BLE dopplers were done past  revealing BLE DVTs including slightly mobile thrombus in L-CFV. She was started on Eliquis treatment dose and placed on bed rest till 08/25 prior to resuming full therapies.  Follow up CBC showed that ABLA is resolving. Serial check of BMET showed that hyponatremia has resolved and renal status is stable.  / Right ankle erythema has resolved and  she completed her antibiotic course on 08/23. Follow up Xrays of right ankle and bilateral knees showed that hardware was intact. She continued to have issues with dizziness due to orthostatic changes and TEDs and abdominal binder used for BP support. She was encourage to increase fluid intake also. As activity tolerance and endurance improved, BP has stabilized and she is not requiring binder for support. recommend that PCP wean florinef and midodrine on outpatient basis. Follow up serial BMET showed that transient hyponatremia has resolved and renal status is stable. Dizziness has resolved and she has made steady gains during her stay. She has progressed to supervision at wheelchair level and will continue to receive follow up [facility] therapies after discharge.  Rehab course: During patient's stay in rehab weekly team conferences were held to monitor patient's progress, set goals and discuss barriers to discharge. At admission, patient required min to total assist with ADL tasks and min assist with mobility.  She  has had improvement in activity tolerance, balance, postural control as well as ability to compensate for deficits. She is able to complete ADL tasks with supervision. She requires CGA for stand pivot transfers and is able to propel her  wheelchair for >150' at modified independent level. Family education was completed with husbanc.    08/06/21 Patient is here today for follow-up.  She suffered a fracture of her left patella and a fracture of her right fibula and tibia.  She is healing well from this.  I evaluated the surgical sites at her left knee as well as her right ankle and these appear clean dry and intact with no evidence of cellulitis.  There is a blister on the dorsum of her right foot that is approximately 1.5 cm in diameter.  The skin around this is erythematous and warm and tender.  It appears that the blister is causing some early cellulitis.  We will treat this with Keflex.  I bandaged this by applying Silvadene to a nonadherent gauze and wrapping it with Kerlix.  Discussed with her husband dressing changes.  She states that her blood pressure is doing well.  She is taking midodrine 3 times a day and fludrocortisone once a day.  She denies any orthostatic dizziness or syncope.  She denies any chest pain or shortness of breath.  Her blood pressure here today is excellent.  She denies any melena or hematochezia.  She is currently taking Eliquis 5 mg twice a day for DVTs.  She will need to complete 6 months of therapy for this.  She seems to be tolerating the medicine well.  She denies any chest pain or shortness of breath or pleurisy.  Patient had normal serum cortisol levels in the hospital and also had a normal cosyntropin challenge test showing no evidence of adrenal insufficiency. Past Medical History:  Diagnosis Date   Environmental allergies    pt still getting workup [id number]   Miscarriage    Past Surgical History:  Procedure Laterality Date   DILATION AND CURETTAGE OF UTERUS     post miscarriage   ORIF ANKLE FRACTURE Right 07/01/2021   Procedure: OPEN REDUCTION INTERNAL FIXATION (ORIF) ANKLE FRACTURE;  Surgeon: Shona Needles, MD;  Location: [place];  Service: Orthopedics;  Laterality: Right;   ORIF PATELLA  Left 07/01/2021   Procedure: OPEN REDUCTION INTERNAL (ORIF) FIXATION PATELLA;  Surgeon: Shona Needles, MD;  Location: [place];  Service: Orthopedics;  Laterality: Left;   TONSILLECTOMY     WISDOM TOOTH EXTRACTION     Current Outpatient Medications on File Prior to Visit  Medication Sig Dispense Refill   acetaminophen (TYLENOL) 325 MG tablet Take 650 mg by mouth every 6 (six) hours as needed for moderate pain or headache.     apixaban (ELIQUIS) 5 MG TABS tablet Take 1 tablet (5 mg total) by mouth 2 (two) times daily. 60 tablet 0   fludrocortisone (FLORINEF) 0.1 MG tablet Take 1 tablet (0.1 mg total) by mouth daily. 30 tablet 0   iron polysaccharides (NIFEREX) 150 MG capsule Take 1 capsule (150 mg total) by mouth daily. 30 capsule 0   methocarbamol (ROBAXIN) 500 MG tablet Take 1 tablet (500 mg total) by mouth every 6 (six) hours as needed  for muscle spasms. 60 tablet 0   metoCLOPramide (REGLAN) 5 MG tablet Take 1 tablet (5 mg total) by mouth 3 (three) times daily before meals. 90 tablet 0   midodrine (PROAMATINE) 10 MG tablet Take 1 tablet (10 mg total) by mouth 3 (three) times daily with meals. 90 tablet 0   polyethylene glycol (MIRALAX / GLYCOLAX) 17 g packet Take 17 g by mouth daily as needed. 14 each 0   vitamin B-12 1000 MCG tablet Take 1 tablet (1,000 mcg total) by mouth daily.     Vitamin D3 (VITAMIN D) 25 MCG tablet Take 2 tablets (2,000 Units total) by mouth daily. 60 tablet 0   HYDROcodone-acetaminophen (NORCO/VICODIN) 5-325 MG tablet Take 1 tablet by mouth daily as needed for moderate pain. (Patient not taking: Reported on 08/06/2021) 5 tablet 0   No current facility-administered medications on file prior to visit.   No Known Allergies Social History   Socioeconomic History   Marital status: Married    Spouse name: Not on file   Number of children: Not on file   Years of education: Not on file   Highest education level: Not on file  Occupational History   Not on file  Tobacco  Use   Smoking status: Former    Packs/day: 0.25    Years: 7.00    Pack years: 1.75    Types: Cigarettes    Start date: 11/22/1978    Quit date: 04/21/1986    Years since quitting: 35.3   Smokeless tobacco: Never  Substance and Sexual Activity   Alcohol use: No    Alcohol/week: 0.0 standard drinks   Drug use: No   Sexual activity: Not on file  Other Topics Concern   Not on file  Social History Narrative   Entered 07/2015:   Married.    Does a lot with horses.    Has one son--@ college   Social Determinants of Health   Financial Resource Strain: Not on file  Food Insecurity: Not on file  Transportation Needs: Not on file  Physical Activity: Not on file  Stress: Not on file  Social Connections: Not on file  Intimate Partner Violence: Not on file     Review of Systems  All other systems reviewed and are negative.     Objective:   Physical Exam Vitals reviewed.  Constitutional:      General: She is not in acute distress.    Appearance: She is well-developed. She is not diaphoretic.  Cardiovascular:     Rate and Rhythm: Normal rate and regular rhythm.     Pulses:          Popliteal pulses are 2+ on the right side and 2+ on the left side.       Dorsalis pedis pulses are 2+ on the right side and 2+ on the left side.       Posterior tibial pulses are 2+ on the right side and 2+ on the left side.     Heart sounds: Normal heart sounds. No murmur heard.   No friction rub. No gallop.  Pulmonary:     Effort: Pulmonary effort is normal. No respiratory distress.     Breath sounds: Normal breath sounds. No wheezing or rales.  Chest:     Chest wall: No tenderness.  Abdominal:     General: Bowel sounds are normal. There is no distension.     Palpations: Abdomen is soft. There is no mass.     Tenderness: There  is no abdominal tenderness. There is no guarding or rebound.  Musculoskeletal:     Right knee: No LCL laxity, MCL laxity, ACL laxity or PCL laxity.     Left knee:  Decreased range of motion. Tenderness present. No LCL laxity, MCL laxity, ACL laxity or PCL laxity.    Right lower leg: No swelling, deformity, tenderness or bony tenderness. No edema.     Left lower leg: No swelling, deformity, tenderness or bony tenderness. No edema.     Right ankle: No tenderness. Decreased range of motion.       Legs:       Feet:          Assessment & Plan:  Acute deep vein thrombosis (DVT) of proximal vein of right lower extremity (HCC) - Plan: CBC with Differential/Platelet, COMPLETE METABOLIC PANEL WITH GFR  Closed fracture of right tibia and fibula, sequela  Closed nondisplaced fracture of left patella, unspecified fracture morphology, sequela  Orthostatic hypotension We will treat the patient for 6 months total with Eliquis 5 mg twice daily.  I will check a CBC to follow-up for postoperative anemia and also check a CMP to evaluate her sodium and potassium levels.  Her pain seems to be well controlled.  I will give the patient Keflex 500 mg p.o. 3 times daily for 7 days for possible early cellulitis on the dorsum of her right foot surrounding a blister that has formed.  We discussed dressing changes and I provided them with dressing supplies.  I recommended decreasing midodrine to 5 mg p.o. 3 times daily and continuing fludrocortisone.  In 1 week if her blood pressure remained stable we will stop midodrine and continue fludrocortisone.  1 week after that if she is still doing well we will wean off the fludrocortisone.  Monitor blood pressure closely while doing this.

## 2021-08-07 LAB — COMPLETE METABOLIC PANEL WITH GFR
AG Ratio: 1.5 (calc) (ref 1.0–2.5)
ALT: 14 U/L (ref 6–29)
AST: 16 U/L (ref 10–35)
Albumin: 4.3 g/dL (ref 3.6–5.1)
Alkaline phosphatase (APISO): 56 U/L (ref 37–153)
BUN: 13 mg/dL (ref 7–25)
CO2: 28 mmol/L (ref 20–32)
Calcium: 9.8 mg/dL (ref 8.6–10.4)
Chloride: 101 mmol/L (ref 98–110)
Creat: 0.66 mg/dL (ref 0.50–1.05)
Globulin: 2.8 g/dL (calc) (ref 1.9–3.7)
Glucose, Bld: 101 mg/dL — ABNORMAL HIGH (ref 65–99)
Potassium: 4 mmol/L (ref 3.5–5.3)
Sodium: 139 mmol/L (ref 135–146)
Total Bilirubin: 0.4 mg/dL (ref 0.2–1.2)
Total Protein: 7.1 g/dL (ref 6.1–8.1)
eGFR: 99 mL/min/{1.73_m2} (ref >=60)

## 2021-08-07 LAB — CBC WITH DIFFERENTIAL/PLATELET
Absolute Monocytes: 683 cells/uL (ref 200–950)
Basophils Absolute: 80 cells/uL (ref 0–200)
Basophils Relative: 1.2 %
Eosinophils Absolute: 147 cells/uL (ref 15–500)
Eosinophils Relative: 2.2 %
HCT: 37.5 % (ref 35.0–45.0)
Hemoglobin: 12.2 g/dL (ref 11.7–15.5)
Lymphs Abs: 1554 cells/uL (ref 850–3900)
MCH: 30.3 pg (ref 27.0–33.0)
MCHC: 32.5 g/dL (ref 32.0–36.0)
MCV: 93.3 fL (ref 80.0–100.0)
MPV: 10.1 fL (ref 7.5–12.5)
Monocytes Relative: 10.2 %
Neutro Abs: 4234 cells/uL (ref 1500–7800)
Neutrophils Relative %: 63.2 %
Platelets: 350 10*3/uL (ref 140–400)
RBC: 4.02 10*6/uL (ref 3.80–5.10)
RDW: 12.7 % (ref 11.0–15.0)
Total Lymphocyte: 23.2 %
WBC: 6.7 10*3/uL (ref 3.8–10.8)

## 2021-08-12 ENCOUNTER — Encounter
Payer: Worker's Compensation | Attending: Physical Medicine and Rehabilitation | Admitting: Physical Medicine and Rehabilitation

## 2021-08-12 ENCOUNTER — Encounter: Payer: Self-pay | Admitting: Physical Medicine and Rehabilitation

## 2021-08-12 ENCOUNTER — Other Ambulatory Visit: Payer: Self-pay

## 2021-08-12 VITALS — BP 135/68 | HR 75 | Temp 98.9°F | Ht 68.0 in | Wt 200.0 lb

## 2021-08-12 DIAGNOSIS — S82201A Unspecified fracture of shaft of right tibia, initial encounter for closed fracture: Secondary | ICD-10-CM | POA: Insufficient documentation

## 2021-08-12 DIAGNOSIS — S82401A Unspecified fracture of shaft of right fibula, initial encounter for closed fracture: Secondary | ICD-10-CM | POA: Insufficient documentation

## 2021-08-12 DIAGNOSIS — I951 Orthostatic hypotension: Secondary | ICD-10-CM | POA: Insufficient documentation

## 2021-08-12 DIAGNOSIS — S82891S Other fracture of right lower leg, sequela: Secondary | ICD-10-CM | POA: Insufficient documentation

## 2021-08-12 DIAGNOSIS — I824Y3 Acute embolism and thrombosis of unspecified deep veins of proximal lower extremity, bilateral: Secondary | ICD-10-CM | POA: Insufficient documentation

## 2021-08-12 DIAGNOSIS — Z7409 Other reduced mobility: Secondary | ICD-10-CM | POA: Diagnosis not present

## 2021-08-12 NOTE — Progress Notes (Signed)
Subjective:    Patient ID: Courtney Berg, female    DOB: [birth year], 65 y.o.   MRN: [id number]  HPI Courtney Berg is a 65 year old woman who presents for hospital follow-up after CIR admission.  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   3) Postoperative pain: -she is only taking Tylenol   Pain Inventory Average Pain 4 Pain Right Now 2 My pain is intermittent, stabbing, and tingling  In the last 24 hours, has pain interfered with the following? General activity 0 Relation with others 0 Enjoyment of life 0 What TIME of day is your pain at its worst? varies Sleep (in general) Good  Pain is worse with: inactivity and some activites Pain improves with: rest, medication, and elevation Relief from Meds: 8  use a walker ability to climb steps?  no do you drive?  no use a wheelchair needs help with transfers  I need assistance with the following:  meal prep, household duties, and shopping  trouble walking spasms  Has been placed on antibiotics for wound on right top of foot   Primary care warren pickard md    Family History  Problem Relation Age of Onset   Diabetes Mother    Hypertension Father    Cancer Father        stomach cancer @ 14. colon cancer @ 11   Colon cancer Father    Diabetes Brother    Cancer Sister        leukemia   Social History   Socioeconomic History   Marital status: Married    Spouse name: Not on file   Number of children: Not on file   Years of education: Not on file   Highest education level: Not on file  Occupational History   Not on file  Tobacco Use   Smoking status: Former    Packs/day: 0.25    Years: 7.00    Pack years: 1.75    Types: Cigarettes    Start  date: 11/22/1978    Quit date: 04/21/1986    Years since quitting: 35.3   Smokeless tobacco: Never  Substance and Sexual Activity   Alcohol use: No    Alcohol/week: 0.0 standard drinks   Drug use: No   Sexual activity: Not on file  Other Topics Concern   Not on file  Social History Narrative   Entered 07/2015:   Married.    Does a lot with horses.    Has one son--@ college   Social Determinants of Health   Financial Resource Strain: Not on file  Food Insecurity: Not on file  Transportation Needs: Not on file  Physical Activity: Not on file  Stress: Not on file  Social Connections: Not on file   Past Surgical History:  Procedure Laterality Date   DILATION AND CURETTAGE OF UTERUS     post miscarriage   ORIF ANKLE FRACTURE Right 07/01/2021   Procedure: OPEN REDUCTION INTERNAL FIXATION (ORIF) ANKLE FRACTURE;  Surgeon: Shona Needles, MD;  Location: [facility];  Service: Orthopedics;  Laterality: Right;   ORIF PATELLA Left 07/01/2021   Procedure: OPEN REDUCTION INTERNAL (ORIF) FIXATION PATELLA;  Surgeon: Shona Needles, MD;  Location: [facility];  Service:  Orthopedics;  Laterality: Left;   TONSILLECTOMY     WISDOM TOOTH EXTRACTION     Past Medical History:  Diagnosis Date   Environmental allergies    pt still getting workup [id number]   Miscarriage    BP 135/68   Pulse 75   Temp 98.9 F (37.2 C)   Ht 5\' 8"  (1.727 m)   Wt 200 lb (90.7 kg) Comment: reported  SpO2 98%   BMI 30.41 kg/m   Opioid Risk Score:   Fall Risk Score:  `1  Depression screen PHQ 2/9  Depression screen [employer] 2/9 08/12/2021 12/16/2020 01/17/2018  Decreased Interest 0 0 2  Down, Depressed, Hopeless 0 0 1  PHQ - 2 Score 0 0 3  Altered sleeping 0 - 3  Tired, decreased energy 0 - 3  Change in appetite 0 - 3  Feeling bad or failure about yourself  0 - 0  Trouble concentrating 0 - 0  Moving slowly or fidgety/restless 0 - 0  Suicidal thoughts 0 - 0  PHQ-9 Score 0 - 12  Difficult doing work/chores - -  Somewhat difficult     Review of Systems  Constitutional: Negative.   HENT: Negative.    Eyes: Negative.   Respiratory: Negative.    Cardiovascular: Negative.   Gastrointestinal: Negative.   Endocrine: Negative.   Genitourinary: Negative.   Musculoskeletal:  Positive for gait problem.  Skin:  Positive for wound.       Right foot (top)  Allergic/Immunologic: Negative.   Hematological: Negative.   Psychiatric/Behavioral: Negative.    All other systems reviewed and are negative.     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 Swelling of left lower extremity.     Assessment & Plan:  Courtney Berg is a 65 year old woman who presents for hospital follow-up after CIR admission for polytrauma.   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  -provided with handicap placard -discussed that prognosis is excellent.  -continue therapies.  -goal to start walking -she is ok to return to work on 9/30- yes she will return to her former job.   3) Postoperative pain: -continue tylenol PRN.   4) Insomnia: -discussed can consider the methocarbamol at night.   5) Hypotension: - wean midodrine, then fludrocortisone

## 2021-09-16 ENCOUNTER — Ambulatory Visit: Payer: 59

## 2021-12-02 ENCOUNTER — Other Ambulatory Visit (HOSPITAL_BASED_OUTPATIENT_CLINIC_OR_DEPARTMENT_OTHER): Payer: Self-pay

## 2021-12-02 ENCOUNTER — Ambulatory Visit: Payer: 59 | Attending: Internal Medicine

## 2021-12-02 DIAGNOSIS — Z23 Encounter for immunization: Secondary | ICD-10-CM

## 2021-12-02 MED ORDER — PFIZER COVID-19 VAC BIVALENT 30 MCG/0.3ML IM SUSP
INTRAMUSCULAR | 0 refills | Status: DC
  Filled 2021-12-02: qty 0.3, 1d supply, fill #0

## 2021-12-02 NOTE — Progress Notes (Signed)
° °  Covid-19 Vaccination Clinic  Name:  Courtney Berg    MRN: [id number] DOB: 08-Jul-1956  12/02/2021  Courtney Berg was observed post Covid-19 immunization for 15 minutes without incident. She was provided with Vaccine Information Sheet and instruction to access the V-Safe system.   Courtney Berg was instructed to call 911 with any severe reactions post vaccine: Difficulty breathing  Swelling of face and throat  A fast heartbeat  A bad rash all over body  Dizziness and weakness   Immunizations Administered     Name Date Dose VIS Date Route   Pfizer Covid-19 Vaccine Bivalent Booster 12/02/2021 11:16 AM 0.3 mL 07/22/2021 Intramuscular   Manufacturer: [place]   Lot: [id number]   [place]: [phone]

## 2022-02-02 ENCOUNTER — Ambulatory Visit: Payer: 59 | Admitting: Family Medicine

## 2022-02-02 ENCOUNTER — Other Ambulatory Visit: Payer: Self-pay

## 2022-02-02 ENCOUNTER — Encounter: Payer: Self-pay | Admitting: Family Medicine

## 2022-02-02 ENCOUNTER — Ambulatory Visit
Admission: RE | Admit: 2022-02-02 | Discharge: 2022-02-02 | Disposition: A | Discharge status: Home / Self Care | Payer: Self-pay | Source: Ambulatory Visit | Attending: Family Medicine | Admitting: Family Medicine

## 2022-02-02 VITALS — BP 112/68 | HR 96 | Temp 97.0°F | Resp 18 | Ht 68.0 in | Wt 202.0 lb

## 2022-02-02 DIAGNOSIS — M79671 Pain in right foot: Secondary | ICD-10-CM

## 2022-02-02 MED ORDER — MELOXICAM 15 MG PO TABS: 15.0000 mg | ORAL_TABLET | Freq: Every day | ORAL | 0 refills | Status: AC

## 2022-02-02 NOTE — Progress Notes (Signed)
? ?Subjective:  ? ? Patient ID: Courtney Berg, female    DOB: [birth year], 66 y.o.   MRN: [id number] ? ?Foot Injury  ?I last saw the patient in September of last year.  She had just left the hospital after suffering a fall fracture of her right ankle.  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  When I last saw the patient, she had an open sore on the dorsum of her right foot near the head of the second metatarsal.  The surrounding skin was erythematous and warm and swollen suggesting cellulitis.  The patient was started on Keflex at that time for 7 days.  I have not seen the patient since that time however she has been following up regularly with her orthopedic surgeon.  She states that her ankle is healing well however she continues to have pain in the dorsum of her right midfoot.  It hurts right at the area where the ulcer healed.  There is now only a 1 cm x 2.5 cm hyperpigmented macule with ulcer was.  However the bones underneath this macule are extremely tender to touch.  She states that she is on her feet for any prolonged period of time the midfoot will swell and she will have aching pain in her foot.  She states that if she hits her foot just right against a solid object she will have debilitating pain in the right foot.  She also reports numbness and tingling in her first and second toes.  She has tenderness in the webspace in between the first and second toes and she has nervelike neuropathic pain radiating from the midfoot down into her toes. ?Past Medical History:  ?Diagnosis Date  ? Environmental allergies   ? pt still getting workup [id number]  ? Miscarriage   ? ?Past Surgical History:  ?Procedure Laterality Date  ? DILATION AND CURETTAGE OF UTERUS    ? post miscarriage  ? ORIF ANKLE FRACTURE Right 07/01/2021  ? Procedure: OPEN REDUCTION INTERNAL FIXATION (ORIF) ANKLE FRACTURE;  Surgeon: Shona Needles, MD;  Location: [facility];  Service: Orthopedics;   Laterality: Right;  ? ORIF PATELLA Left 07/01/2021  ? Procedure: OPEN REDUCTION INTERNAL (ORIF) FIXATION PATELLA;  Surgeon: Shona Needles, MD;  Location: [facility];  Service: Orthopedics;  Laterality: Left;  ? TONSILLECTOMY    ? WISDOM TOOTH EXTRACTION    ? ?Current Outpatient Medications on File Prior to Visit  ?Medication Sig Dispense Refill  ? acetaminophen (TYLENOL) 325 MG tablet Take 650 mg by mouth every 6 (six) hours as needed for moderate pain or headache.    ? apixaban (ELIQUIS) 5 MG TABS tablet Take 1 tablet (5 mg total) by mouth 2 (two) times daily. 60 tablet 4  ? fludrocortisone (FLORINEF) 0.1 MG tablet Take 1 tablet (0.1 mg total) by mouth daily. 30 tablet 0  ? iron polysaccharides (NIFEREX) 150 MG capsule Take 1 capsule (150 mg total) by mouth daily. 30 capsule 0  ? methocarbamol (ROBAXIN) 500 MG tablet Take 1 tablet (500 mg total) by mouth every 6 (six) hours as needed for muscle spasms. 60 tablet 0  ? metoCLOPramide (REGLAN) 5 MG tablet Take 1 tablet (5 mg total) by mouth 3 (three) times daily before meals. 90 tablet 0  ? midodrine (PROAMATINE) 10 MG tablet Take 1 tablet (10 mg total) by mouth 3 (three) times daily with meals. 90 tablet 0  ? polyethylene glycol (MIRALAX / GLYCOLAX) 17 g packet  Take 17 g by mouth daily as needed. 14 each 0  ? vitamin B-12 1000 MCG tablet Take 1 tablet (1,000 mcg total) by mouth daily.    ? Vitamin D3 (VITAMIN D) 25 MCG tablet Take 2 tablets (2,000 Units total) by mouth daily. 60 tablet 0  ? ?No current facility-administered medications on file prior to visit.  ? ?No Known Allergies ?Social History  ? ?Socioeconomic History  ? Marital status: Married  ?  Spouse name: Not on file  ? Number of children: Not on file  ? Years of education: Not on file  ? Highest education level: Not on file  ?Occupational History  ? Not on file  ?Tobacco Use  ? Smoking status: Former  ?  Packs/day: 0.25  ?  Years: 7.00  ?  Pack years: 1.75  ?  Types: Cigarettes  ?  Start date: 11/22/1978  ?   Quit date: 04/21/1986  ?  Years since quitting: 35.8  ? Smokeless tobacco: Never  ?Substance and Sexual Activity  ? Alcohol use: No  ?  Alcohol/week: 0.0 standard drinks  ? Drug use: No  ? Sexual activity: Not on file  ?Other Topics Concern  ? Not on file  ?Social History Narrative  ? Entered 07/2015:  ? Married.   ? Does a lot with horses.   ? Has one son--@ college  ? ?Social Determinants of Health  ? ?Financial Resource Strain: Not on file  ?Food Insecurity: Not on file  ?Transportation Needs: Not on file  ?Physical Activity: Not on file  ?Stress: Not on file  ?Social Connections: Not on file  ?Intimate Partner Violence: Not on file  ? ? ? ?Review of Systems  ?All other systems reviewed and are negative. ? ?   ?Objective:  ? Physical Exam ?Vitals reviewed.  ?Constitutional:   ?   General: She is not in acute distress. ?   Appearance: She is well-developed. She is not diaphoretic.  ?Cardiovascular:  ?   Rate and Rhythm: Normal rate and regular rhythm.  ?   Pulses:     ?     Popliteal pulses are 2+ on the right side and 2+ on the left side.  ?     Dorsalis pedis pulses are 2+ on the right side and 2+ on the left side.  ?     Posterior tibial pulses are 2+ on the right side and 2+ on the left side.  ?   Heart sounds: Normal heart sounds. No murmur heard. ?  No friction rub. No gallop.  ?Pulmonary:  ?   Effort: Pulmonary effort is normal. No respiratory distress.  ?   Breath sounds: Normal breath sounds. No wheezing or rales.  ?Chest:  ?   Chest wall: No tenderness.  ?Abdominal:  ?   General: Bowel sounds are normal. There is no distension.  ?   Palpations: Abdomen is soft. There is no mass.  ?   Tenderness: There is no abdominal tenderness. There is no guarding or rebound.  ?Musculoskeletal:  ?   Right lower leg: No swelling, deformity, tenderness or bony tenderness. No edema.  ?   Left lower leg: No swelling, deformity, tenderness or bony tenderness. No edema.  ?   Right foot: Normal range of motion. No deformity.  ?      Feet: ? ?Feet:  ?   Right foot:  ?   Skin integrity: No ulcer, blister, skin breakdown, erythema, warmth or callus.  ? ? ?Hyperpigmented  macule on the dorsum of the right midfoot near the head of the second metatarsal.  No visible erythema or swelling or warmth.  Tender to palpation in that area.  Tender to palpation in the webspace between the first and second toes.  She does have some numbness and tingling at the tips of her first and second toes. ? ? ?   ?Assessment & Plan:  ? ? ? ?Right foot pain - Plan: DG Foot Complete Right ?Begin by obtaining an x-ray of the right foot to rule out any bony pathology in the area where she is tender to palpation.  Pain almost sounds neuropathic in nature.  Also question if she may have a Morton's neuroma in the webspace between her first and second toes based on her symptoms.  If the x-ray shows abnormality such as a nonhealing fracture, we will discuss treatment options at that point.  Meanwhile I will start the patient on meloxicam 15 mg a day to try to assist in the pain.  The x-ray is completely normal, I would recommend podiatry consultation to discuss a possible injection for Morton's neuroma to see if some of the neuropathic pain in her foot may be improved with this. ? ? ? ?

## 2022-03-16 IMAGING — DX DG KNEE 1-2V*L*
2 series · 2 of 2 positions shown · non-contrast
Comparison: 06/26/2021

CLINICAL DATA: ORIF patellar fracture.

EXAM:
LEFT KNEE - 1-2 VIEW

[knee ap]
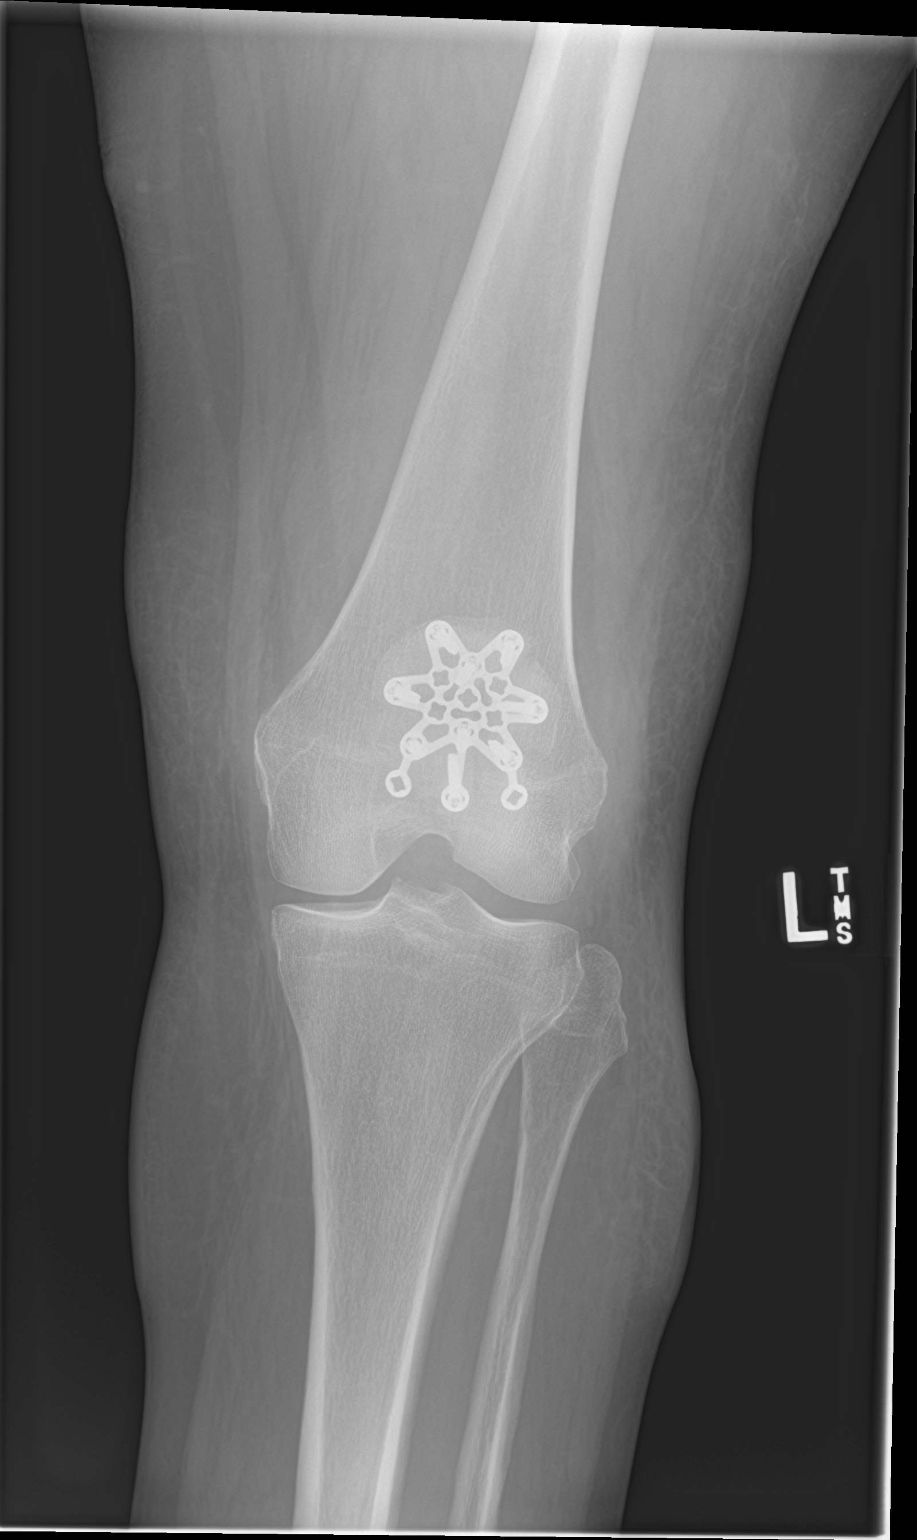

[knee lat]
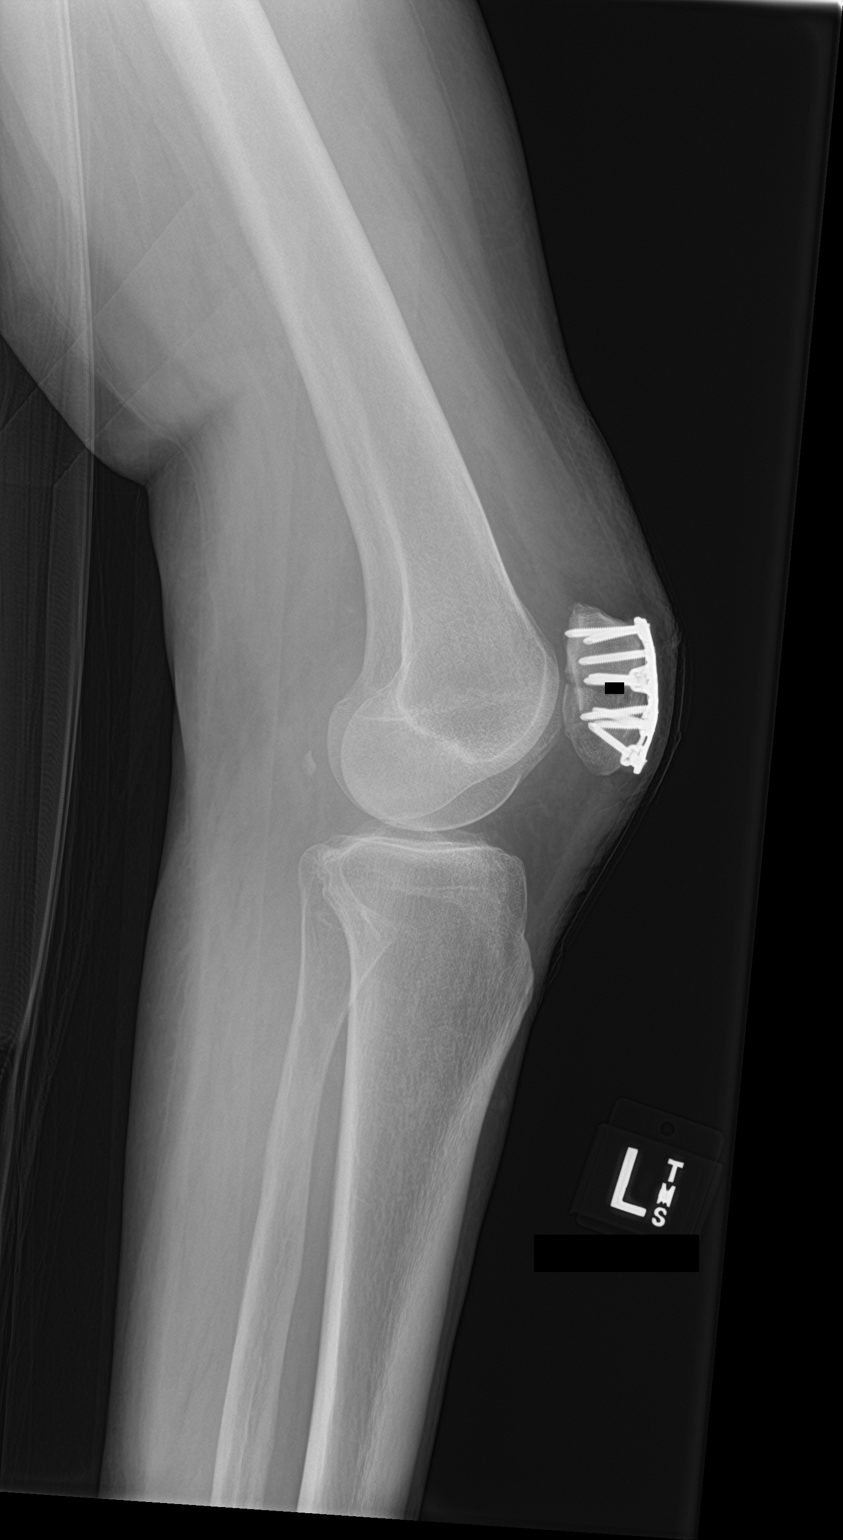

[2 of 2 positions shown; findings below may reference images not displayed]

FINDINGS: Post sideplate fixation of the patella. Alignment appears near
anatomic. No evidence of hardware failure or loosening. There is a
minimal amount of expected adjacent soft tissue swelling. Small knee
joint effusion. No evidence of lipohemarthrosis. Joint spaces are
preserved.
IMPRESSION: Post sideplate fixation of the patella without evidence of
complication.

## 2022-07-19 ENCOUNTER — Other Ambulatory Visit: Payer: Self-pay

## 2022-07-19 ENCOUNTER — Emergency Department (HOSPITAL_BASED_OUTPATIENT_CLINIC_OR_DEPARTMENT_OTHER): Payer: 59

## 2022-07-19 ENCOUNTER — Encounter (HOSPITAL_BASED_OUTPATIENT_CLINIC_OR_DEPARTMENT_OTHER): Payer: Self-pay | Admitting: Emergency Medicine

## 2022-07-19 ENCOUNTER — Emergency Department (HOSPITAL_BASED_OUTPATIENT_CLINIC_OR_DEPARTMENT_OTHER)
Admission: EM | Admit: 2022-07-19 | Discharge: 2022-07-20 | Disposition: A | Discharge status: Home / Self Care | Payer: 59 | Attending: Emergency Medicine | Admitting: Emergency Medicine

## 2022-07-19 DIAGNOSIS — R110 Nausea: Secondary | ICD-10-CM | POA: Insufficient documentation

## 2022-07-19 DIAGNOSIS — R1030 Lower abdominal pain, unspecified: Secondary | ICD-10-CM | POA: Insufficient documentation

## 2022-07-19 DIAGNOSIS — M545 Low back pain, unspecified: Secondary | ICD-10-CM | POA: Diagnosis not present

## 2022-07-19 LAB — COMPREHENSIVE METABOLIC PANEL
ALT: 14 U/L (ref 0–44)
AST: 23 U/L (ref 15–41)
Albumin: 4.6 g/dL (ref 3.5–5.0)
Alkaline Phosphatase: 47 U/L (ref 38–126)
Anion gap: 12 (ref 5–15)
BUN: 12 mg/dL (ref 8–23)
CO2: 26 mmol/L (ref 22–32)
Calcium: 9.8 mg/dL (ref 8.9–10.3)
Chloride: 102 mmol/L (ref 98–111)
Creatinine, Ser: 0.81 mg/dL (ref 0.44–1.00)
GFR, Estimated: >60 mL/min (ref >60)
Glucose, Bld: 137 mg/dL — ABNORMAL HIGH (ref 70–99)
Potassium: 4.1 mmol/L (ref 3.5–5.1)
Sodium: 140 mmol/L (ref 135–145)
Total Bilirubin: 0.5 mg/dL (ref 0.3–1.2)
Total Protein: 8 g/dL (ref 6.5–8.1)

## 2022-07-19 LAB — CBC
HCT: 40.6 % (ref 36.0–46.0)
Hemoglobin: 13.7 g/dL (ref 12.0–15.0)
MCH: 30.9 pg (ref 26.0–34.0)
MCHC: 33.7 g/dL (ref 30.0–36.0)
MCV: 91.4 fL (ref 80.0–100.0)
Platelets: 341 10*3/uL (ref 150–400)
RBC: 4.44 MIL/uL (ref 3.87–5.11)
RDW: 13.6 % (ref 11.5–15.5)
WBC: 8.3 10*3/uL (ref 4.0–10.5)
nRBC: 0 % (ref 0.0–0.2)

## 2022-07-19 LAB — LIPASE, BLOOD: Lipase: 15 U/L (ref 11–51)

## 2022-07-19 LAB — URINALYSIS, ROUTINE W REFLEX MICROSCOPIC
Bilirubin Urine: NEGATIVE
Glucose, UA: NEGATIVE mg/dL
Hgb urine dipstick: NEGATIVE
Ketones, ur: 15 mg/dL — AB
Nitrite: NEGATIVE
Specific Gravity, Urine: 1.024 (ref 1.005–1.030)
pH: 5.5 (ref 5.0–8.0)

## 2022-07-19 LAB — D-DIMER, QUANTITATIVE: D-Dimer, Quant: 0.3 ug/mL-FEU (ref 0.00–0.50)

## 2022-07-19 LAB — TROPONIN I (HIGH SENSITIVITY)
Troponin I (High Sensitivity): 6 ng/L (ref <18)
Troponin I (High Sensitivity): 7 ng/L (ref <18)

## 2022-07-19 MED ORDER — OXYCODONE-ACETAMINOPHEN 5-325 MG PO TABS
1.0000 | ORAL_TABLET | ORAL | Status: AC | PRN
  Administered 2022-07-19 (×2): 1 via ORAL
  Filled 2022-07-19 (×2): qty 1

## 2022-07-19 MED ORDER — ONDANSETRON 4 MG PO TBDP
4.0000 mg | ORAL_TABLET | Freq: Once | ORAL | Status: AC | PRN
  Administered 2022-07-19: 4 mg via ORAL
  Filled 2022-07-19: qty 1

## 2022-07-19 MED ORDER — IOHEXOL 300 MG/ML  SOLN
100.0000 mL | Freq: Once | INTRAMUSCULAR | Status: AC | PRN
  Administered 2022-07-19: 100 mL via INTRAVENOUS

## 2022-07-19 MED ORDER — MORPHINE SULFATE (PF) 4 MG/ML IV SOLN
4.0000 mg | Freq: Once | INTRAVENOUS | Status: AC
  Administered 2022-07-19: 4 mg via INTRAVENOUS
  Filled 2022-07-19: qty 1

## 2022-07-19 NOTE — ED Triage Notes (Signed)
Pt was hospitalized last year for a month with several leg fx. She was dx with DVt. Finished eliquis in [place]

## 2022-07-19 NOTE — ED Notes (Signed)
Patient transported to CT 

## 2022-07-19 NOTE — ED Triage Notes (Signed)
Saturday lumbar pain but had lifted 50 bales of hay on Friday. But then yesterday the abdominal pain started /pelvic pain ,sharp. No painful urination.

## 2022-07-19 NOTE — ED Provider Notes (Signed)
[facility] EMERGENCY DEPT Provider Note   CSN: [id number] Arrival date & time: 07/19/22  1523     History  Chief Complaint  Patient presents with   Abdominal Pain    Courtney Berg is a 66 y.o. female. With past medical history of DVT presents to the emergency department for abdominal pain.   States Saturday afternoon she began having lower back pain that she describes as cramping.  She states that then Sunday she began having abdominal pain that was constant and almost cramping with episodes of severe sharp pains.  She states that today it is progressed and felt she needed to be evaluated.  She has had associated nausea without vomiting.  She states that laying on her right side hurts worse and laying flat helps.  She has had no dysuria, diarrhea, chest pain, shortness of breath.  She has had no fevers.  Notably, on Saturday prior to her low back pain she was moving over 50 barrels of hay.  She had a significant lower extremity injury about 1 year ago and has been mostly inactive since then until this weekend.    Abdominal Pain Associated symptoms: nausea   Associated symptoms: no vomiting        Home Medications Prior to Admission medications   Medication Sig Start Date End Date Taking? Authorizing Provider  acetaminophen (TYLENOL) 325 MG tablet Take 650 mg by mouth every 6 (six) hours as needed for moderate pain or headache.    [provider]  meloxicam (MOBIC) 15 MG tablet Take 1 tablet (15 mg total) by mouth daily. 02/02/22   Susy Frizzle, MD  polyethylene glycol (MIRALAX / GLYCOLAX) 17 g packet Take 17 g by mouth daily as needed. Patient not taking: Reported on 02/02/2022 07/23/21   Love, Ivan Anchors, PA-C  vitamin B-12 1000 MCG tablet Take 1 tablet (1,000 mcg total) by mouth daily. 07/07/21   Raiford Noble Latif, DO  Vitamin D3 (VITAMIN D) 25 MCG tablet Take 2 tablets (2,000 Units total) by mouth daily. 07/23/21   Bary Leriche, PA-C      Allergies     Patient has no known allergies.    Review of Systems   Review of Systems  Gastrointestinal:  Positive for abdominal pain and nausea. Negative for vomiting.  Musculoskeletal:  Positive for back pain.  All other systems reviewed and are negative.   Physical Exam Updated Vital Signs BP 121/65 (BP Location: Left Arm)   Pulse (!) 58   Temp 98.2 F (36.8 C) (Oral)   Resp 17   SpO2 93%  Physical Exam Vitals and nursing note reviewed.  Constitutional:      General: She is not in acute distress.    Appearance: Normal appearance. She is well-developed. She is obese. She is ill-appearing. She is not toxic-appearing.  HENT:     Head: Normocephalic and atraumatic.     Mouth/Throat:     Mouth: Mucous membranes are moist.  Eyes:     General: No scleral icterus.    Extraocular Movements: Extraocular movements intact.     Pupils: Pupils are equal, round, and reactive to light.  Cardiovascular:     Rate and Rhythm: Normal rate and regular rhythm.     Heart sounds: Normal heart sounds.  Pulmonary:     Effort: Pulmonary effort is normal. No respiratory distress.     Breath sounds: Normal breath sounds.  Abdominal:     General: Abdomen is protuberant. Bowel sounds are normal.  There is no distension.     Palpations: Abdomen is soft.     Tenderness: There is abdominal tenderness in the epigastric area and periumbilical area.  Musculoskeletal:        General: Normal range of motion.  Skin:    General: Skin is warm and dry.     Capillary Refill: Capillary refill takes less than 2 seconds.  Neurological:     General: No focal deficit present.     Mental Status: She is alert and oriented to person, place, and time. Mental status is at baseline.  Psychiatric:        Mood and Affect: Mood normal.        Behavior: Behavior normal.        Thought Content: Thought content normal.        Judgment: Judgment normal.    ED Results / Procedures / Treatments   Labs (all labs ordered are listed,  but only abnormal results are displayed) Labs Reviewed  COMPREHENSIVE METABOLIC PANEL - Abnormal; Notable for the following components:      Result Value   Glucose, Bld 137 (*)    All other components within normal limits  URINALYSIS, ROUTINE W REFLEX MICROSCOPIC - Abnormal; Notable for the following components:   Ketones, ur 15 (*)    Protein, ur TRACE (*)    Leukocytes,Ua MODERATE (*)    All other components within normal limits  LIPASE, BLOOD  CBC  D-DIMER, QUANTITATIVE  TROPONIN I (HIGH SENSITIVITY)  TROPONIN I (HIGH SENSITIVITY)   EKG EKG Interpretation  Date/Time:  Monday July 19 2022 16:16:15 EDT Ventricular Rate:  53 PR Interval:  186 QRS Duration: 78 QT Interval:  434 QTC Calculation: 407 R Axis:   -3 Text Interpretation: Sinus bradycardia Non-specific ST-t changes Confirmed by Lajean Saver [phone]) on 07/20/2022 4:22:54 PM  Radiology CT Abdomen Pelvis W Contrast  Result Date: 07/19/2022 CLINICAL DATA:  Abdominal pain, acute nonlocalized. EXAM: CT ABDOMEN AND PELVIS WITH CONTRAST TECHNIQUE: Multidetector CT imaging of the abdomen and pelvis was performed using the standard protocol following bolus administration of intravenous contrast. RADIATION DOSE REDUCTION: This exam was performed according to the departmental dose-optimization program which includes automated exposure control, adjustment of the mA and/or kV according to patient size and/or use of iterative reconstruction technique. CONTRAST:  163m OMNIPAQUE IOHEXOL 300 MG/ML  SOLN COMPARISON:  None Available. FINDINGS: Lower chest: No acute abnormality. Hepatobiliary: No focal liver abnormality is seen. Gallbladder is distended without gallstones, gallbladder wall thickening, or biliary dilatation. Pancreas: Unremarkable. No pancreatic ductal dilatation or surrounding inflammatory changes. Spleen: Normal in size without focal abnormality. Adrenals/Urinary Tract: Adrenal glands are unremarkable. Simple cysts in the  lower pole of the left kidney measuring up to 1.3 cm. Kidneys are otherwise unremarkable, without renal calculi, focal lesion, or hydronephrosis. Bladder is unremarkable. Stomach/Bowel: Stomach is within normal limits. Appendix appears normal. No evidence of bowel wall thickening, distention, or inflammatory changes. Vascular/Lymphatic: Aortic atherosclerosis. No enlarged abdominal or pelvic lymph nodes. Reproductive: Uterus and bilateral adnexa are unremarkable. Other: No abdominal wall hernia or abnormality. No abdominopelvic ascites. Musculoskeletal: Degenerate disc disease of the lumbar spine. 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 No acute osseous abnormality. IMPRESSION: 1. No CT evidence of acute abdominal/pelvic process. 2. No evidence of nephrolithiasis or hydronephrosis. Simple cysts in the lower pole of the left kidney, which does not require follow-up. 3. Normal appendix.  No evidence of colitis or diverticulitis. 4. Uterus and adnexa are unremarkable. 5. 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 Electronically Signed   By: Keane Police D.O.   On: 07/19/2022 22:45    Procedures Procedures   Medications Ordered in ED Medications  oxyCODONE-acetaminophen (PERCOCET/ROXICET) 5-325 MG per tablet 1 tablet (1 tablet Oral Given 07/19/22 2311)  ondansetron (ZOFRAN-ODT) disintegrating tablet 4 mg (4 mg Oral Given 07/19/22 1603)  morphine (PF) 4 MG/ML injection 4 mg (4 mg Intravenous Given 07/19/22 2311)  iohexol (OMNIPAQUE) 300 MG/ML solution 100 mL (100 mLs Intravenous Contrast Given 07/19/22 2229)    ED Course/ Medical Decision Making/ A&P                           Medical Decision Making Amount and/or Complexity of Data Reviewed Labs: ordered. Radiology: ordered.  Risk Prescription drug management.  This patient presents to the ED with chief complaint(s) of abdominal pain with pertinent past medical history of DVT which further  complicates the presenting complaint. The complaint involves an extensive differential diagnosis and also carries with it a high risk of complications and morbidity.    The differential diagnosis includes Acute hepatobiliary disease, pancreatitis, appendicitis, PUD, gastritis, SBO, diverticulitis, colitis, viral gastroenteritis, Crohn's, UC, vascular catastrophe, UTI, pyelonephritis, renal stone, obstructed stone, infected stone, ovarian torsion, ectopic pregnancy, TOA, PID, STD, etc.    Additional history obtained: Additional history obtained from spouse Records reviewed Care Everywhere/External Records and Primary Care Documents  ED Course and Reassessment: 66 year old female who presents to the emergency department with low back and abdominal pain.  Physical exam noted for periumbilical abdominal pain on palpation.  There is no peritonitic findings. Prior to seeing the patient she had a work-up initiated in triage. Labs are notable for only leukocytosis in her urine.  Significantly, her CMP, CBC, lipase, troponin and D-dimer are all negative.  EKG without ischemia or infarction. Not feel her symptoms are related to ACS.  This was initiated in triage. Unsure why D-dimer was obtained in triage however do not think patient has PE or other clotting disorder.  D-dimer is negative. Lipase is negative, doubt acute pancreatitis. No evidence of infection, she is not febrile, no leukocytosis or reported fevers at home. No electrolyte dysfunction, transaminitis, acute kidney injury. Given the intensity of her pain, obtaining CT abdomen pelvis with contrast.  We will also give her a dose of Zofran and morphine here. CT is negative for any acute findings. Do feel that her symptoms may be MSK as she had significant physical activity the day before her symptoms began.  Her symptoms also seem positional when she is having more pain on the right side and improving with laying down.  May be overexertion.   Overall feel that she is low risk and have found no acute intra-abdominal findings here in the emergency department to indicate further work-up or admission.  Discussed using Tylenol and Motrin as well as rest, gentle stretching.  She is agreeable to this plan.  Feel that overall she is safe for discharge at this time  Independent labs interpretation:  The following labs were independently interpreted:  CMP with only mildly elevated glucose, no transaminitis, AKI, electrolyte derangement CBC within normal limits Lipase 15, negative UA with low ketones, trace protein, moderate leuks, 11-20 white blood cells D-dimer is negative Troponin 6  Independent visualization of imaging: - I independently visualized the following imaging with scope of interpretation limited to determining acute life threatening conditions related to emergency care: CT abdomen pelvis with contrast,  which revealed no acute findings  Consultation: - Consulted or discussed management/test interpretation w/ external professional: N/A  Consideration for admission or further workup: do not feel there is further work up indicated, no indication for admission Social Determinants of health: none identified  Final Clinical Impression(s) / ED Diagnoses Final diagnoses:  Lower abdominal pain    Rx / DC Orders ED Discharge Orders     None         Mickie Hillier, PA-C 07/21/22 [place], [place], MD 07/22/22 [phone]

## 2022-07-20 NOTE — Discharge Instructions (Signed)
Seen in the emergency department today for abdominal and low back pain.  This is likely muscular.  Please take ibuprofen on a scheduled basis over the next few days to help improve your symptoms.  Also rest and gentle stretching may be helpful.  Please ease back into exercise and lifting heavier weights.  Please return for any worsening symptoms.

## 2022-10-28 DIAGNOSIS — Z809 Family history of malignant neoplasm, unspecified: Secondary | ICD-10-CM | POA: Diagnosis not present

## 2022-10-28 DIAGNOSIS — Z87891 Personal history of nicotine dependence: Secondary | ICD-10-CM | POA: Diagnosis not present

## 2022-10-28 DIAGNOSIS — E669 Obesity, unspecified: Secondary | ICD-10-CM | POA: Diagnosis not present

## 2022-10-28 DIAGNOSIS — Z008 Encounter for other general examination: Secondary | ICD-10-CM | POA: Diagnosis not present

## 2022-10-28 DIAGNOSIS — Z6831 Body mass index (BMI) 31.0-31.9, adult: Secondary | ICD-10-CM | POA: Diagnosis not present

## 2022-10-28 DIAGNOSIS — Z8249 Family history of ischemic heart disease and other diseases of the circulatory system: Secondary | ICD-10-CM | POA: Diagnosis not present

## 2023-02-07 DIAGNOSIS — Z1231 Encounter for screening mammogram for malignant neoplasm of breast: Secondary | ICD-10-CM | POA: Diagnosis not present

## 2023-02-07 LAB — HM MAMMOGRAPHY

## 2023-02-14 ENCOUNTER — Encounter: Payer: Self-pay | Admitting: Family Medicine

## 2023-03-01 ENCOUNTER — Encounter: Payer: Self-pay | Admitting: Family Medicine

## 2023-03-01 NOTE — Progress Notes (Unsigned)
Error abstracted.

## 2023-06-16 DIAGNOSIS — H25813 Combined forms of age-related cataract, bilateral: Secondary | ICD-10-CM | POA: Diagnosis not present

## 2023-06-16 DIAGNOSIS — H5213 Myopia, bilateral: Secondary | ICD-10-CM | POA: Diagnosis not present

## 2023-06-16 DIAGNOSIS — H31001 Unspecified chorioretinal scars, right eye: Secondary | ICD-10-CM | POA: Diagnosis not present

## 2023-08-03 ENCOUNTER — Encounter: Payer: Self-pay | Admitting: Podiatry

## 2023-08-03 ENCOUNTER — Ambulatory Visit: Payer: Medicare HMO | Admitting: Podiatry

## 2023-08-03 DIAGNOSIS — S99922A Unspecified injury of left foot, initial encounter: Secondary | ICD-10-CM

## 2023-08-03 NOTE — Progress Notes (Signed)
  Subjective:  Patient ID: Courtney Berg, female    DOB: [birth year],   MRN: [id number]  Chief Complaint  Patient presents with   Nail Problem    Left foot - 2nd toe nail (one) sore and turned white in color/lifting and blood around border,Sore to touch.does not remember injuring the nail     67 y.o. female presents for concern for left second toenail pain. Relates started a couple weeks ago. Does not remember any injury but relates tenderness and soreness to the toe Relates it has improved after she started soaking it in epsom salts. Denies any other pedal complaints. Denies n/v/f/c.   Past Medical History:  Diagnosis Date   DVT (deep venous thrombosis) (HCC)    left leg after fracture   Environmental allergies    pt still getting workup [id number]   Miscarriage     Objective:  Physical Exam: Vascular: DP/PT pulses 2/4 bilateral. CFT <3 seconds. Normal hair growth on digits. No edema.  Skin. No lacerations or abrasions bilateral feet. Right distal hallux with hemorrhagic subungual debris distally. Left second digit with underlying hematoma. Nail well adhered. No erythema edema or purulence noted.  Musculoskeletal: MMT 5/5 bilateral lower extremities in DF, PF, Inversion and Eversion. Deceased ROM in DF of ankle joint.  Neurological: Sensation intact to light touch.   Assessment:   1. Injury of toenail of left foot, initial encounter      Plan:  Patient was evaluated and treated and all questions answered. Toe was evaluated and appears to be healing well. Nails has some underlying hematoma. Discussed removal vs monitoring. Discussed potential outcomes with patient  Will continue to monitor for now.  Nails 1-5 bilateral debrided as courtesy today.  Patient to follow-up as needed.    Louann Sjogren, DPM

## 2023-11-09 DIAGNOSIS — Z87891 Personal history of nicotine dependence: Secondary | ICD-10-CM | POA: Diagnosis not present

## 2023-11-09 DIAGNOSIS — Z791 Long term (current) use of non-steroidal anti-inflammatories (NSAID): Secondary | ICD-10-CM | POA: Diagnosis not present

## 2023-11-09 DIAGNOSIS — Z809 Family history of malignant neoplasm, unspecified: Secondary | ICD-10-CM | POA: Diagnosis not present

## 2023-11-09 DIAGNOSIS — M1990 Unspecified osteoarthritis, unspecified site: Secondary | ICD-10-CM | POA: Diagnosis not present

## 2023-11-09 DIAGNOSIS — Z6833 Body mass index (BMI) 33.0-33.9, adult: Secondary | ICD-10-CM | POA: Diagnosis not present

## 2023-11-09 DIAGNOSIS — Z8249 Family history of ischemic heart disease and other diseases of the circulatory system: Secondary | ICD-10-CM | POA: Diagnosis not present

## 2023-11-09 DIAGNOSIS — E669 Obesity, unspecified: Secondary | ICD-10-CM | POA: Diagnosis not present

## 2023-11-09 DIAGNOSIS — Z823 Family history of stroke: Secondary | ICD-10-CM | POA: Diagnosis not present

## 2024-02-13 DIAGNOSIS — N958 Other specified menopausal and perimenopausal disorders: Secondary | ICD-10-CM | POA: Diagnosis not present

## 2024-02-13 DIAGNOSIS — Z1231 Encounter for screening mammogram for malignant neoplasm of breast: Secondary | ICD-10-CM | POA: Diagnosis not present

## 2024-02-13 DIAGNOSIS — E2839 Other primary ovarian failure: Secondary | ICD-10-CM | POA: Diagnosis not present

## 2024-02-13 LAB — HM MAMMOGRAPHY

## 2024-02-13 LAB — HM DEXA SCAN: HM Dexa Scan: NORMAL

## 2024-02-14 ENCOUNTER — Encounter: Payer: Self-pay | Admitting: Family Medicine

## 2024-10-22 DIAGNOSIS — H5213 Myopia, bilateral: Secondary | ICD-10-CM | POA: Diagnosis not present

## 2024-10-22 DIAGNOSIS — H31003 Unspecified chorioretinal scars, bilateral: Secondary | ICD-10-CM | POA: Diagnosis not present

## 2024-10-22 DIAGNOSIS — H02402 Unspecified ptosis of left eyelid: Secondary | ICD-10-CM | POA: Diagnosis not present

## 2024-10-22 DIAGNOSIS — H25813 Combined forms of age-related cataract, bilateral: Secondary | ICD-10-CM | POA: Diagnosis not present
